# Patient Record
Sex: Female | Born: 1960
Health system: Southern US, Community
[De-identification: ages and names within clinical notes are randomized; demographics above are authoritative.]

## PROBLEM LIST (undated history)

## (undated) DIAGNOSIS — K219 Gastro-esophageal reflux disease without esophagitis: Secondary | ICD-10-CM

## (undated) DIAGNOSIS — M810 Age-related osteoporosis without current pathological fracture: Secondary | ICD-10-CM

## (undated) DIAGNOSIS — Z853 Personal history of malignant neoplasm of breast: Secondary | ICD-10-CM

## (undated) DIAGNOSIS — B009 Herpesviral infection, unspecified: Secondary | ICD-10-CM

## (undated) DIAGNOSIS — C50919 Malignant neoplasm of unspecified site of unspecified female breast: Secondary | ICD-10-CM

## (undated) DIAGNOSIS — Z801 Family history of malignant neoplasm of trachea, bronchus and lung: Secondary | ICD-10-CM

## (undated) DIAGNOSIS — Z9889 Other specified postprocedural states: Secondary | ICD-10-CM

## (undated) DIAGNOSIS — D649 Anemia, unspecified: Secondary | ICD-10-CM

## (undated) DIAGNOSIS — Z8041 Family history of malignant neoplasm of ovary: Secondary | ICD-10-CM

## (undated) HISTORY — DX: Family history of malignant neoplasm of ovary: Z80.41

## (undated) HISTORY — DX: Family history of malignant neoplasm of trachea, bronchus and lung: Z80.1

## (undated) HISTORY — DX: Other specified postprocedural states: Z98.890

## (undated) HISTORY — PX: BREAST ENHANCEMENT SURGERY: SHX7

## (undated) HISTORY — DX: Age-related osteoporosis without current pathological fracture: M81.0

## (undated) HISTORY — DX: Malignant neoplasm of unspecified site of unspecified female breast: C50.919

## (undated) HISTORY — DX: Personal history of malignant neoplasm of breast: Z85.3

## (undated) HISTORY — PX: BREAST LUMPECTOMY: SHX2

## (undated) HISTORY — DX: Anemia, unspecified: D64.9

---
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
## 1995-04-30 DIAGNOSIS — Z853 Personal history of malignant neoplasm of breast: Secondary | ICD-10-CM

## 1995-04-30 HISTORY — DX: Personal history of malignant neoplasm of breast: Z85.3

## 2001-07-13 ENCOUNTER — Encounter (INDEPENDENT_AMBULATORY_CARE_PROVIDER_SITE_OTHER): Payer: Self-pay | Admitting: Specialist

## 2001-07-13 ENCOUNTER — Ambulatory Visit (HOSPITAL_BASED_OUTPATIENT_CLINIC_OR_DEPARTMENT_OTHER): Admission: RE | Admit: 2001-07-13 | Discharge: 2001-07-14 | Payer: Self-pay | Admitting: Specialist

## 2002-10-12 ENCOUNTER — Encounter: Payer: Self-pay | Admitting: *Deleted

## 2002-10-12 ENCOUNTER — Encounter: Admission: RE | Admit: 2002-10-12 | Discharge: 2002-10-12 | Payer: Self-pay | Admitting: *Deleted

## 2003-03-28 ENCOUNTER — Other Ambulatory Visit: Admission: RE | Admit: 2003-03-28 | Discharge: 2003-03-28 | Payer: Self-pay | Admitting: Obstetrics and Gynecology

## 2003-04-11 ENCOUNTER — Ambulatory Visit (HOSPITAL_COMMUNITY): Admission: RE | Admit: 2003-04-11 | Discharge: 2003-04-11 | Payer: Self-pay | Admitting: Obstetrics and Gynecology

## 2004-03-06 ENCOUNTER — Ambulatory Visit (HOSPITAL_COMMUNITY): Admission: RE | Admit: 2004-03-06 | Discharge: 2004-03-06 | Payer: Self-pay | Admitting: Obstetrics and Gynecology

## 2004-08-07 ENCOUNTER — Other Ambulatory Visit: Admission: RE | Admit: 2004-08-07 | Discharge: 2004-08-07 | Payer: Self-pay | Admitting: Obstetrics and Gynecology

## 2006-02-27 DIAGNOSIS — B009 Herpesviral infection, unspecified: Secondary | ICD-10-CM

## 2006-02-27 HISTORY — DX: Herpesviral infection, unspecified: B00.9

## 2006-03-11 ENCOUNTER — Other Ambulatory Visit: Admission: RE | Admit: 2006-03-11 | Discharge: 2006-03-11 | Payer: Self-pay | Admitting: Obstetrics and Gynecology

## 2006-03-25 ENCOUNTER — Ambulatory Visit (HOSPITAL_COMMUNITY): Admission: RE | Admit: 2006-03-25 | Discharge: 2006-03-25 | Payer: Self-pay | Admitting: Obstetrics and Gynecology

## 2006-04-07 ENCOUNTER — Encounter: Admission: RE | Admit: 2006-04-07 | Discharge: 2006-04-07 | Payer: Self-pay | Admitting: Obstetrics and Gynecology

## 2006-04-13 ENCOUNTER — Encounter: Admission: RE | Admit: 2006-04-13 | Discharge: 2006-04-13 | Payer: Self-pay | Admitting: Obstetrics and Gynecology

## 2006-07-09 ENCOUNTER — Ambulatory Visit: Payer: Self-pay | Admitting: Oncology

## 2006-10-02 ENCOUNTER — Encounter: Admission: RE | Admit: 2006-10-02 | Discharge: 2006-10-02 | Payer: Self-pay | Admitting: Obstetrics and Gynecology

## 2009-03-02 ENCOUNTER — Encounter: Admission: RE | Admit: 2009-03-02 | Discharge: 2009-03-02 | Payer: Self-pay | Admitting: Obstetrics and Gynecology

## 2010-05-19 ENCOUNTER — Encounter: Payer: Self-pay | Admitting: Obstetrics and Gynecology

## 2010-05-20 ENCOUNTER — Encounter: Payer: Self-pay | Admitting: Obstetrics and Gynecology

## 2010-09-14 NOTE — Op Note (Signed)
[facility]  Patient:    Alice [name], Alice [name] Visit Number: [id number] MRN: [id number]          Service Type: DSU Location: [facility] Attending Physician:  Gustavus Messing Dictated by:   Yaakov Guthrie. Shon Hough, M.D. Proc. Date: 07/13/01 Admit Date:  07/13/2001                             Operative Report  INDICATIONS FOR PROCEDURE: This 50 year old lady has severe macromastia with back and shoulder pain secondary to large pendulous breast, increased discomfort, rashes under both breast areas, and pitting of both shoulders. Has tried conservative treatment with no avail for support of her chest.  OPERATION/PROCEDURE: Bilateral breast reduction using inferior pedicle technique.  SURGEON: Yaakov Guthrie. Shon Hough, M.D.  ASSISTANT: Margaretha Sheffield, R.N.  DESCRIPTION OF PROCEDURE: The patient was sat up and drawn for the inferior pedicle reduction and mammoplasty, remarking nipple and areolar complexes from 34 cm to 20 cm.  She then underwent general anesthesia and was intubated orally.  Prep was done to the chest and breast areas in routine fashion using Betadine soap and solution and walled off with sterile towels and drapes so as to make a sterile field.  Xylocaine 0.25% was then injected locally, a total of 200 cc per side.  The wounds were scored with 15 blades and then the skin over the inferior pedicle de-epithelialized with #20 blades.  The medial and lateral fatty dermal pedicles were incised down to underlying pectoralis fascia.  A new keyhole area was also debulked and out laterally more tissue was removed, and accessory breast tissue along the latissimus dorsi and anterior axillary regions.  After proper hemostasis and irrigation the flaps were transposed and stayed with 3-0 Prolene.  Subcutaneous closure was done with 3-0 Monocryl x2 layers, then a running subcuticular stitch of 3-0 Monocryl, and 5-0 Monocryl throughout the inverted T.   Steri-Strips and soft dressings were applied to all the areas.  She withstood the procedures very well and was taken to recovery in good condition.  ESTIMATED BLOOD LOSS: Less than 100 cc.  COMPLICATIONS: None. Dictated by:   Yaakov Guthrie. Shon Hough, M.D. Attending Physician:  Gustavus Messing DD:  07/13/01 TD:  07/14/01 Job: [id number]

## 2011-09-16 ENCOUNTER — Telehealth: Payer: Self-pay | Admitting: Obstetrics and Gynecology

## 2011-09-16 NOTE — Telephone Encounter (Signed)
Triage/epic 

## 2011-09-17 NOTE — Telephone Encounter (Signed)
EP THIS PT  HAS CONCERNS ABOUT HER FIBROIDS AND WANT TO TALK TO YOU ABOUT GETTING SOMETHING DONE.

## 2011-09-18 ENCOUNTER — Telehealth: Payer: Self-pay | Admitting: Obstetrics and Gynecology

## 2011-09-18 NOTE — Telephone Encounter (Signed)
51 YO with history of fibroids called with complaints of bleeding 2 weeks at a time and heavy. Has seen a GYN in [place] where she now resides but wanted to possibly have robotic surgery (hysterctomy or myomectomy with our practice.  Reviewed other options for management:  Mirena IUD and endometrial ablation.since she is close to menopause. Patient to have her records sent to [facility].  She will be in [facility] August 16th for 2 weeks (her spouse still resides her).  To contact patient once her records are received.  Demarian Epps, PA-C

## 2011-11-28 ENCOUNTER — Telehealth: Payer: Self-pay | Admitting: Obstetrics and Gynecology

## 2011-11-28 NOTE — Telephone Encounter (Signed)
Alice [name] pt want you to give her a call asap.

## 2011-12-19 ENCOUNTER — Encounter: Payer: Self-pay | Admitting: Obstetrics and Gynecology

## 2015-08-18 LAB — HM MAMMOGRAPHY

## 2015-09-15 DIAGNOSIS — Z9889 Other specified postprocedural states: Secondary | ICD-10-CM

## 2015-09-15 HISTORY — DX: Other specified postprocedural states: Z98.890

## 2015-09-15 LAB — HM SIGMOIDOSCOPY

## 2015-11-16 ENCOUNTER — Ambulatory Visit (INDEPENDENT_AMBULATORY_CARE_PROVIDER_SITE_OTHER): Payer: BLUE CROSS/BLUE SHIELD | Admitting: Medical

## 2015-11-16 ENCOUNTER — Encounter: Payer: Self-pay | Admitting: Medical

## 2015-11-16 VITALS — BP 110/70 | HR 88 | Temp 97.6°F

## 2015-11-16 DIAGNOSIS — R600 Localized edema: Secondary | ICD-10-CM

## 2015-11-16 NOTE — Progress Notes (Signed)
   Subjective:    Patient ID: Alice [name], female    DOB: [birth year], 55 y.o.   MRN: [id number]  HPI Chief Complaint  Patient presents with  . Leg Pain    both legs feeling swollen to her and has "dips" in it. does not weigh her self.    Here as a new patient today.   Moved back to [place] this past fall.   Was living out in [place].    Here for leg symptoms the last few days.   Feels swelling in legs, feels funny, little divets present.   No prior similar.  No recent strenuous activity.  Exercise - walks daily for about 45 minutes.  Working for [employer] as Scientist, forensic.    No recent fever, no recent illness.  No recent weight loss.  Was heavier in remote past, had lost a lot of weight.  Lets ache a little.   No recent long travel, injury, trauma, no hx/o DVT, no prior hx/o varicose veins, edema.  Up to date on pap smear.  Other than hx/o breast cancer, no other hx/o cancer.   No other aggravating or relieving factors. No other complaint.  Past Medical History  Diagnosis Date  . History of breast cancer    Past Surgical History  Procedure Laterality Date  . Breast lumpectomy      right due to breast cancer  . Breast enhancement surgery      s/p cancer and surgery, right   Social History   Social History  . Marital Status: Married    Spouse Name: N/A  . Number of Children: N/A  . Years of Education: N/A   Occupational History  . Not on file.   Social History Main Topics  . Smoking status: Never Smoker   . Smokeless tobacco: Not on file  . Alcohol Use: No  . Drug Use: No  . Sexual Activity: Not on file   Other Topics Concern  . Not on file   Social History Narrative  . No narrative on file     Review of Systems     Objective:   Physical Exam  BP 110/70 mmHg  Pulse 88  Wt   General appearance: alert, no distress, WD/WN, AA female Neck: supple, no lymphadenopathy, no thyromegaly, no masses, no JVD Heart: RRR, normal S1, S2,  no murmurs Lungs: CTA bilaterally, no wheezes, rhonchi, or rales Pulses: 2+ symmetric, upper and lower extremities, normal cap refill Legs throughout with faint divets, some fullness of soft tissue superificial of legs throughout, but no palpable cords, no warmth, nontender, no obvious inflamed varicose veins, no obvious varicose veins.  No worrisome findings.   Legs neurovascularly intact otherwise.      Assessment & Plan:   Encounter Diagnosis  Name Primary?  . Bilateral leg edema Yes   discussed symptoms, concerns.   Dr. Redmond School supervising physician also examined her.   We both were in agreement that although she note recent change in leg appearance, we suspect her legs have probably been looking this way a while.  Suggestive of veins congestion superficially, no evidence of DVT or other worrisome findings.   Advised leg elevation, limit salt, c/t routine exercise, can consider compression hose, short term Asprin OTC for a week.   F/u if worse.  Can consider vascular consult if worse.   Will request recent labs and records from gyn and GI.

## 2015-11-21 ENCOUNTER — Encounter: Payer: Self-pay | Admitting: Family Medicine

## 2015-11-21 ENCOUNTER — Telehealth: Payer: Self-pay

## 2015-11-21 NOTE — Telephone Encounter (Signed)
Records placed in your folder for review.

## 2015-11-29 ENCOUNTER — Encounter: Payer: Self-pay | Admitting: Family Medicine

## 2015-11-30 ENCOUNTER — Telehealth: Payer: Self-pay

## 2015-11-30 NOTE — Telephone Encounter (Signed)
Placed in your folder for review. 

## 2015-12-12 ENCOUNTER — Encounter: Payer: Self-pay | Admitting: Family Medicine

## 2015-12-19 ENCOUNTER — Encounter: Payer: Self-pay | Admitting: Family Medicine

## 2016-03-28 DIAGNOSIS — R05 Cough: Secondary | ICD-10-CM | POA: Diagnosis not present

## 2016-03-28 DIAGNOSIS — J069 Acute upper respiratory infection, unspecified: Secondary | ICD-10-CM | POA: Diagnosis not present

## 2016-04-01 DIAGNOSIS — J329 Chronic sinusitis, unspecified: Secondary | ICD-10-CM | POA: Diagnosis not present

## 2016-04-01 DIAGNOSIS — J069 Acute upper respiratory infection, unspecified: Secondary | ICD-10-CM | POA: Diagnosis not present

## 2016-07-22 DIAGNOSIS — Z1231 Encounter for screening mammogram for malignant neoplasm of breast: Secondary | ICD-10-CM | POA: Diagnosis not present

## 2016-07-22 DIAGNOSIS — Z01419 Encounter for gynecological examination (general) (routine) without abnormal findings: Secondary | ICD-10-CM | POA: Diagnosis not present

## 2016-07-22 DIAGNOSIS — Z124 Encounter for screening for malignant neoplasm of cervix: Secondary | ICD-10-CM | POA: Diagnosis not present

## 2016-07-22 DIAGNOSIS — N898 Other specified noninflammatory disorders of vagina: Secondary | ICD-10-CM | POA: Diagnosis not present

## 2017-02-12 DIAGNOSIS — M79672 Pain in left foot: Secondary | ICD-10-CM | POA: Diagnosis not present

## 2017-02-12 DIAGNOSIS — M79671 Pain in right foot: Secondary | ICD-10-CM | POA: Diagnosis not present

## 2017-03-10 DIAGNOSIS — M79604 Pain in right leg: Secondary | ICD-10-CM | POA: Diagnosis not present

## 2017-03-10 DIAGNOSIS — M79605 Pain in left leg: Secondary | ICD-10-CM | POA: Diagnosis not present

## 2017-03-12 DIAGNOSIS — R399 Unspecified symptoms and signs involving the genitourinary system: Secondary | ICD-10-CM | POA: Diagnosis not present

## 2017-03-17 DIAGNOSIS — M545 Low back pain: Secondary | ICD-10-CM | POA: Diagnosis not present

## 2017-04-15 DIAGNOSIS — M545 Low back pain: Secondary | ICD-10-CM | POA: Diagnosis not present

## 2017-04-18 DIAGNOSIS — M48062 Spinal stenosis, lumbar region with neurogenic claudication: Secondary | ICD-10-CM | POA: Diagnosis not present

## 2017-04-30 DIAGNOSIS — M5416 Radiculopathy, lumbar region: Secondary | ICD-10-CM | POA: Diagnosis not present

## 2017-04-30 DIAGNOSIS — M79662 Pain in left lower leg: Secondary | ICD-10-CM | POA: Diagnosis not present

## 2017-04-30 DIAGNOSIS — M79661 Pain in right lower leg: Secondary | ICD-10-CM | POA: Diagnosis not present

## 2017-05-12 DIAGNOSIS — Z131 Encounter for screening for diabetes mellitus: Secondary | ICD-10-CM | POA: Diagnosis not present

## 2017-05-12 DIAGNOSIS — Z0000 Encounter for general adult medical examination without abnormal findings: Secondary | ICD-10-CM | POA: Diagnosis not present

## 2017-05-12 DIAGNOSIS — Z862 Personal history of diseases of the blood and blood-forming organs and certain disorders involving the immune mechanism: Secondary | ICD-10-CM | POA: Diagnosis not present

## 2017-05-12 DIAGNOSIS — M4726 Other spondylosis with radiculopathy, lumbar region: Secondary | ICD-10-CM | POA: Diagnosis not present

## 2017-05-12 DIAGNOSIS — Z13220 Encounter for screening for lipoid disorders: Secondary | ICD-10-CM | POA: Diagnosis not present

## 2017-05-14 DIAGNOSIS — M79662 Pain in left lower leg: Secondary | ICD-10-CM | POA: Diagnosis not present

## 2017-05-14 DIAGNOSIS — M5416 Radiculopathy, lumbar region: Secondary | ICD-10-CM | POA: Diagnosis not present

## 2017-05-14 DIAGNOSIS — M79661 Pain in right lower leg: Secondary | ICD-10-CM | POA: Diagnosis not present

## 2017-05-30 DIAGNOSIS — M48061 Spinal stenosis, lumbar region without neurogenic claudication: Secondary | ICD-10-CM | POA: Diagnosis not present

## 2017-06-12 DIAGNOSIS — M79662 Pain in left lower leg: Secondary | ICD-10-CM | POA: Diagnosis not present

## 2017-06-12 DIAGNOSIS — M5416 Radiculopathy, lumbar region: Secondary | ICD-10-CM | POA: Diagnosis not present

## 2017-06-12 DIAGNOSIS — M79661 Pain in right lower leg: Secondary | ICD-10-CM | POA: Diagnosis not present

## 2017-07-29 DIAGNOSIS — M545 Low back pain: Secondary | ICD-10-CM | POA: Diagnosis not present

## 2017-07-31 DIAGNOSIS — L910 Hypertrophic scar: Secondary | ICD-10-CM | POA: Diagnosis not present

## 2017-08-29 DIAGNOSIS — M25562 Pain in left knee: Secondary | ICD-10-CM | POA: Diagnosis not present

## 2017-09-01 DIAGNOSIS — M25562 Pain in left knee: Secondary | ICD-10-CM | POA: Diagnosis not present

## 2017-09-15 DIAGNOSIS — M25562 Pain in left knee: Secondary | ICD-10-CM | POA: Diagnosis not present

## 2017-12-03 DIAGNOSIS — Z01419 Encounter for gynecological examination (general) (routine) without abnormal findings: Secondary | ICD-10-CM | POA: Diagnosis not present

## 2017-12-03 DIAGNOSIS — L910 Hypertrophic scar: Secondary | ICD-10-CM | POA: Diagnosis not present

## 2017-12-03 DIAGNOSIS — Z124 Encounter for screening for malignant neoplasm of cervix: Secondary | ICD-10-CM | POA: Diagnosis not present

## 2017-12-03 DIAGNOSIS — Z1231 Encounter for screening mammogram for malignant neoplasm of breast: Secondary | ICD-10-CM | POA: Diagnosis not present

## 2018-04-17 DIAGNOSIS — M25562 Pain in left knee: Secondary | ICD-10-CM | POA: Diagnosis not present

## 2018-05-20 DIAGNOSIS — M25562 Pain in left knee: Secondary | ICD-10-CM | POA: Diagnosis not present

## 2018-06-25 DIAGNOSIS — E669 Obesity, unspecified: Secondary | ICD-10-CM | POA: Diagnosis not present

## 2018-06-25 DIAGNOSIS — Z1211 Encounter for screening for malignant neoplasm of colon: Secondary | ICD-10-CM | POA: Diagnosis not present

## 2018-06-25 DIAGNOSIS — K219 Gastro-esophageal reflux disease without esophagitis: Secondary | ICD-10-CM | POA: Diagnosis not present

## 2018-06-25 DIAGNOSIS — R1013 Epigastric pain: Secondary | ICD-10-CM | POA: Diagnosis not present

## 2018-06-30 DIAGNOSIS — K219 Gastro-esophageal reflux disease without esophagitis: Secondary | ICD-10-CM

## 2018-06-30 HISTORY — DX: Gastro-esophageal reflux disease without esophagitis: K21.9

## 2018-07-09 ENCOUNTER — Other Ambulatory Visit: Payer: Self-pay | Admitting: Gastroenterology

## 2018-07-09 DIAGNOSIS — R1013 Epigastric pain: Secondary | ICD-10-CM

## 2018-07-13 ENCOUNTER — Ambulatory Visit
Admission: RE | Admit: 2018-07-13 | Discharge: 2018-07-13 | Disposition: A | Discharge status: Home / Self Care | Payer: BLUE CROSS/BLUE SHIELD | Source: Ambulatory Visit | Attending: Gastroenterology | Admitting: Gastroenterology

## 2018-07-13 DIAGNOSIS — R1013 Epigastric pain: Secondary | ICD-10-CM

## 2018-07-23 DIAGNOSIS — R51 Headache: Secondary | ICD-10-CM | POA: Diagnosis not present

## 2018-07-28 ENCOUNTER — Ambulatory Visit: Payer: BLUE CROSS/BLUE SHIELD | Admitting: Registered"

## 2018-08-12 ENCOUNTER — Other Ambulatory Visit: Payer: Self-pay | Admitting: Family Medicine

## 2018-08-12 DIAGNOSIS — R51 Headache: Principal | ICD-10-CM

## 2018-08-12 DIAGNOSIS — R519 Headache, unspecified: Secondary | ICD-10-CM

## 2018-08-17 DIAGNOSIS — N6310 Unspecified lump in the right breast, unspecified quadrant: Secondary | ICD-10-CM | POA: Diagnosis not present

## 2018-08-17 DIAGNOSIS — Z853 Personal history of malignant neoplasm of breast: Secondary | ICD-10-CM | POA: Diagnosis not present

## 2018-08-19 ENCOUNTER — Other Ambulatory Visit: Payer: Self-pay

## 2018-08-19 ENCOUNTER — Ambulatory Visit
Admission: RE | Admit: 2018-08-19 | Discharge: 2018-08-19 | Disposition: A | Discharge status: Home / Self Care | Payer: BLUE CROSS/BLUE SHIELD | Source: Ambulatory Visit | Attending: Family Medicine | Admitting: Family Medicine

## 2018-08-19 DIAGNOSIS — R519 Headache, unspecified: Secondary | ICD-10-CM

## 2018-08-19 DIAGNOSIS — R51 Headache: Secondary | ICD-10-CM | POA: Diagnosis not present

## 2018-09-08 DIAGNOSIS — Z853 Personal history of malignant neoplasm of breast: Secondary | ICD-10-CM | POA: Diagnosis not present

## 2018-09-23 ENCOUNTER — Other Ambulatory Visit: Payer: Self-pay | Admitting: Radiology

## 2018-09-23 DIAGNOSIS — C50811 Malignant neoplasm of overlapping sites of right female breast: Secondary | ICD-10-CM | POA: Diagnosis not present

## 2018-09-23 DIAGNOSIS — Z853 Personal history of malignant neoplasm of breast: Secondary | ICD-10-CM | POA: Diagnosis not present

## 2018-09-23 DIAGNOSIS — N6325 Unspecified lump in the left breast, overlapping quadrants: Secondary | ICD-10-CM | POA: Diagnosis not present

## 2018-09-25 ENCOUNTER — Telehealth: Payer: Self-pay | Admitting: Oncology

## 2018-09-25 ENCOUNTER — Encounter: Payer: Self-pay | Admitting: *Deleted

## 2018-09-25 NOTE — Telephone Encounter (Signed)
Spoke to patient to confirm morning [facility] appointment for 6/3, packet will be mailed to patient

## 2018-09-29 ENCOUNTER — Other Ambulatory Visit: Payer: Self-pay | Admitting: *Deleted

## 2018-09-29 ENCOUNTER — Encounter: Payer: Self-pay | Admitting: Oncology

## 2018-09-29 DIAGNOSIS — Z170 Estrogen receptor positive status [ER+]: Secondary | ICD-10-CM

## 2018-09-29 DIAGNOSIS — C50411 Malignant neoplasm of upper-outer quadrant of right female breast: Secondary | ICD-10-CM | POA: Insufficient documentation

## 2018-09-29 NOTE — Progress Notes (Signed)
[place]  [phone] [phone]     ID: Alice [name] DOB: 12-21-1960  MR#: [id number]  VXY#:[id number]  Patient Care Team: Alice Rm, NP-C as PCP - General (Family Medicine) Alice Kaufmann, RN as Oncology Nurse Navigator Alice Germany, RN as Oncology Nurse Navigator Alice [name], Alice Dad, MD as Consulting Physician (Oncology) Kyung Rudd, MD as Consulting Physician (Radiation Oncology) Rolm Bookbinder, MD as Consulting Physician (General Surgery) Alice Regal, PA-C as Consulting Physician (Obstetrics and Gynecology) Alice Craver, MD as Consulting Physician (Gastroenterology) Alice Amel, MD as Consulting Physician (Family Medicine) Alice Polio, MD as Consulting Physician (Plastic Surgery) Alice Cruel, MD OTHER MD:  CHIEF COMPLAINT: Estrogen receptor positive breast cancer  CURRENT TREATMENT: Awaiting definitive surgery   HISTORY OF CURRENT ILLNESS: Alice [name] has a history of remote right breast cancer in 1997, treated with right lumpectomy and radiation in [facility].  The patient did not receive chemotherapy or antiestrogen treatments.  More recently she presented for her annual mammogram with a non-tender area of firmness in the right breast at 9 o'clock for 2 months. She underwent bilateral diagnostic mammography with tomography and right breast ultrasonography at [facility] on 09/08/2018 showing: breast density category A; scattered-appearing fibroglandular tissue at the lumpectomy site in the upper outer right breast middle depth spanning 3.4 cm, with indistinct margins, was not seen on prior mammogram from 2018.  There was also a keloid scar in the inferior medial left breast.  On physical exam there was a rectangular area of hardness measuring up to 7 cm in the right breast at 9:00, corresponding with the prior lumpectomy and consistent with radiation change.  Ultrasound of this area showed  only postlumpectomy change, with no suspicious or discrete mass.  Ultrasound of the axilla was benign   Accordingly on 09/23/2018 she proceeded to biopsy of the right breast area in question. The pathology from this procedure [facility] 20-07/02/2000 showed: invasive ductal carcinoma, E-cadherin positive,  grade 3,  Prognostic indicators significant for: estrogen receptor at 100% positive and progesterone receptor, 30% positive, both with strong staining intensity proliferation marker Ki67 at 70%. HER2 negative by immunohistochemistry (0).  The patient's subsequent history is as detailed below.   INTERVAL HISTORY: Alice [name] was evaluated in the multidisciplinary breast cancer clinic on 09/30/2018. Her husband and brother participated via [place]. Her case was also presented at the multidisciplinary breast cancer conference on the same day. At that time a preliminary plan was proposed: Genetics testing, Oncotype from the initial biopsy, mastectomy, likely adjuvant radiation, antiestrogens   REVIEW OF SYSTEMS: Alice [name] reports doing well overall. She notes a lack of appetite, attributing it to the anxiety surrounding her diagnosis. She is working from home. She walks about 40 minutes every day. The patient denies unusual headaches, visual changes, nausea, vomiting, stiff neck, dizziness, or gait imbalance. There has been no cough, phlegm production, or pleurisy, no chest pain or pressure, and no change in bowel or bladder habits. The patient denies fever, rash, bleeding, unexplained fatigue or unexplained weight loss. A detailed review of systems was otherwise entirely negative.   PAST MEDICAL HISTORY: Past Medical History:  Diagnosis Date   Anemia    Breast cancer ([place])    GERD (gastroesophageal reflux disease) 06/30/2018   Herpes simplex type 2 infection 02/2006   History of breast cancer 1997   History of esophagogastroduodenoscopy (EGD) 09/15/2015   small hiatal hernia was noted otherwise all was  normal    PAST SURGICAL HISTORY: Past Surgical  History:  Procedure Laterality Date   BREAST ENHANCEMENT SURGERY     s/p cancer and surgery, right   BREAST LUMPECTOMY     right due to breast cancer  wisdom teeth removal   FAMILY HISTORY: Family History  Problem Relation Age of Onset   Ovarian cancer Sister 43   Dementia Mother    Aneurysm Father    Patient's father was 101 years old when he died from aneurysm. Patient's mother died from dementia at age 53. Her sister was diagnosed with ovarian cancer at age 13, and this subsequently took her life.  In total of the patient has 5 brothers and 2 sisters.  She is not aware of other cancers in the family  GYNECOLOGIC HISTORY:  No LMP recorded. Patient is postmenopausal. Menarche: 58 years old [place] P 0 LMP age 57 Contraceptive none HRT none  Hysterectomy? no BSO? 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 She is married. Husband Alice [name] is from [place], so his native language is [place]. She lives at home with her husband. They have no children. She attends a [employer] in [place].    ADVANCED DIRECTIVES: In the absence of documents to the contrary her husband is automatically her HCPOA.   HEALTH MAINTENANCE: Social History   Tobacco Use   Smoking status: Never Smoker  Substance Use Topics   Alcohol use: No    Alcohol/week: 0.0 standard drinks   Drug use: No     Colonoscopy: endoscopy in 2017, colonoscopy due 2020 but delayed due to virus  PAP: 12/03/2017, normal  Bone density: never done   No Known Allergies  No current outpatient medications on file.   No current facility-administered medications for this visit.     OBJECTIVE: [name] in no acute distress  Vitals:   09/30/18 0903  BP: 115/80  Pulse: 91  Resp: 18  Temp: 98.7 F (37.1 C)  SpO2: 100%     Body mass index is 33.8 kg/m.   Wt Readings  from Last 3 Encounters:  09/30/18 196 lb 14.4 oz (89.3 kg)      ECOG FS:1 - Symptomatic but completely ambulatory  Ocular: Sclerae unicteric, pupils round and equal Ear-nose-throat: Oropharynx clear and moist Lymphatic: No cervical or supraclavicular adenopathy Lungs no rales or rhonchi Heart regular rate and rhythm Abd soft, nontender, positive bowel sounds MSK no focal spinal tenderness, no joint edema Neuro: non-focal, well-oriented, appropriate affect Breasts: The right breast is status post remote lumpectomy and radiation.  There is some irregularity to the contour.  There is significant induration in the lower outer and lateral aspects.  The nipple is slightly inverted.  There is no erythema.  Both breasts are status post reduction mammoplasty.  Both axillae are benign   LAB RESULTS:  CMP     Component Value Date/Time   NA 139 09/30/2018 0819   K 3.7 09/30/2018 0819   CL 107 09/30/2018 0819   CO2 22 09/30/2018 0819   GLUCOSE 90 09/30/2018 0819   BUN 5 (L) 09/30/2018 0819   CREATININE 0.73 09/30/2018 0819   CALCIUM 9.0 09/30/2018 0819   PROT 7.9 09/30/2018 0819   ALBUMIN 4.0 09/30/2018 0819   AST 20 09/30/2018 0819   ALT 6 09/30/2018 0819   ALKPHOS 126 09/30/2018 0819   BILITOT 0.5 09/30/2018 0819   GFRNONAA >60 09/30/2018 0819   GFRAA >60 09/30/2018 0819    No results  found for: TOTALPROTELP, ALBUMINELP, A1GS, A2GS, BETS, BETA2SER, GAMS, MSPIKE, SPEI  No results found for: Nils Pyle, [facility]  Lab Results  Component Value Date   WBC 6.7 09/30/2018   NEUTROABS 4.2 09/30/2018   HGB 13.1 09/30/2018   HCT 39.6 09/30/2018   MCV 95.4 09/30/2018   PLT 353 09/30/2018    @LASTCHEMISTRY @  No results found for: LABCA2  No components found for: CBULAG536  No results for input(s): INR in the last 168 hours.  No results found for: LABCA2  No results found for: IWO032  No results found for: ZYY482  No results found for: NOI370  No results  found for: CA2729  No components found for: HGQUANT  No results found for: CEA1 / No results found for: CEA1   No results found for: AFPTUMOR  No results found for: CHROMOGRNA  No results found for: PSA1  Appointment on 09/30/2018  Component Date Value Ref Range Status   Sodium 09/30/2018 139  135 - 145 mmol/L Final   Potassium 09/30/2018 3.7  3.5 - 5.1 mmol/L Final   Chloride 09/30/2018 107  98 - 111 mmol/L Final   CO2 09/30/2018 22  22 - 32 mmol/L Final   Glucose, Bld 09/30/2018 90  70 - 99 mg/dL Final   BUN 09/30/2018 5* 6 - 20 mg/dL Final   Creatinine 09/30/2018 0.73  0.44 - 1.00 mg/dL Final   Calcium 09/30/2018 9.0  8.9 - 10.3 mg/dL Final   Total Protein 09/30/2018 7.9  6.5 - 8.1 g/dL Final   Albumin 09/30/2018 4.0  3.5 - 5.0 g/dL Final   AST 09/30/2018 20  15 - 41 U/L Final   ALT 09/30/2018 6  0 - 44 U/L Final   Alkaline Phosphatase 09/30/2018 126  38 - 126 U/L Final   Total Bilirubin 09/30/2018 0.5  0.3 - 1.2 mg/dL Final   GFR, Est Non Af Am 09/30/2018 >60  >60 mL/min Final   GFR, Est AFR Am 09/30/2018 >60  >60 mL/min Final   Anion gap 09/30/2018 10  5 - 15 Final   Performed at [facility], [place] [address], [place], [place] [address]   WBC Count 09/30/2018 6.7  4.0 - 10.5 K/uL Final   RBC 09/30/2018 4.15  3.87 - 5.11 MIL/uL Final   Hemoglobin 09/30/2018 13.1  12.0 - 15.0 g/dL Final   HCT 09/30/2018 39.6  36.0 - 46.0 % Final   MCV 09/30/2018 95.4  80.0 - 100.0 fL Final   MCH 09/30/2018 31.6  26.0 - 34.0 pg Final   MCHC 09/30/2018 33.1  30.0 - 36.0 g/dL Final   RDW 09/30/2018 12.4  11.5 - 15.5 % Final   Platelet Count 09/30/2018 353  150 - 400 K/uL Final   nRBC 09/30/2018 0.0  0.0 - 0.2 % Final   Neutrophils Relative % 09/30/2018 63  % Final   Neutro Abs 09/30/2018 4.2  1.7 - 7.7 K/uL Final   Lymphocytes Relative 09/30/2018 29  % Final   Lymphs Abs 09/30/2018 2.0  0.7 - 4.0 K/uL Final   Monocytes Relative  09/30/2018 7  % Final   Monocytes Absolute 09/30/2018 0.5  0.1 - 1.0 K/uL Final   Eosinophils Relative 09/30/2018 0  % Final   Eosinophils Absolute 09/30/2018 0.0  0.0 - 0.5 K/uL Final   Basophils Relative 09/30/2018 1  % Final   Basophils Absolute 09/30/2018 0.1  0.0 - 0.1 K/uL Final   Immature Granulocytes 09/30/2018 0  % Final  Abs Immature Granulocytes 09/30/2018 0.01  0.00 - 0.07 K/uL Final   Performed at [facility], [place] [address], [place], [place] [address]    (this displays the last labs from the last 3 days)  No results found for: TOTALPROTELP, ALBUMINELP, A1GS, A2GS, BETS, BETA2SER, GAMS, MSPIKE, SPEI (this displays SPEP labs)  No results found for: KPAFRELGTCHN, LAMBDASER, KAPLAMBRATIO (kappa/lambda light chains)  No results found for: HGBA, HGBA2QUANT, HGBFQUANT, HGBSQUAN (Hemoglobinopathy evaluation)   No results found for: LDH  No results found for: IRON, TIBC, IRONPCTSAT (Iron and TIBC)  No results found for: FERRITIN  Urinalysis No results found for: COLORURINE, APPEARANCEUR, LABSPEC, PHURINE, GLUCOSEU, HGBUR, BILIRUBINUR, KETONESUR, PROTEINUR, UROBILINOGEN, NITRITE, LEUKOCYTESUR   STUDIES: No results found.  ELIGIBLE FOR AVAILABLE RESEARCH PROTOCOL: no  ASSESSMENT: 58 y.o. Alice [name]   (1) status post right lumpectomy in 1997,  (a) status post adjuvant radiation  (b) did not receive antiestrogens or chemotherapy  (2) status post right breast upper outer quadrant biopsy 09/23/2018 for a clinical t2 N0, stage IIA invasive ductal carcinoma, grade 3, estrogen and progesterone receptor positive, HER-2 not amplified, with an MIB-1 of 70%  (3) Oncotype to be obtained from the initial biopsy  (4) definitive surgery to follow  (5) adjuvant chemotherapy likely  (6) anastrozole started 09/30/2018  PLAN: I spent approximately 60 minutes face to face with Stefan with more than 50% of that time spent in counseling and  coordination of care. Specifically we reviewed the biology of the patient's diagnosis and the specifics of her situation.  We first reviewed the fact that cancer is not one disease but more than 100 different diseases and that it is important to keep them separate-- otherwise when friends and relatives discuss their own cancer experiences with [place] confusion can result. Similarly we explained that if breast cancer spreads to the bone or liver, the patient would not have bone cancer or liver cancer, but breast cancer in the bone and breast cancer in the liver: one cancer in three places-- not 3 different cancers which otherwise would have to be treated in 3 different ways.  We discussed the difference between local and systemic therapy. In terms of loco-regional treatment, lumpectomy plus radiation is equivalent to mastectomy as far as survival is concerned.  However since as she already had radiation to the right breast, the standard of care is for a right mastectomy at this point.   We also noted that in terms of sequencing of treatments, whether systemic therapy or surgery is done first does not affect the ultimate outcome.  This is relevant to her case because she is interested in starting antiestrogens now given a couple of weeks wait before she can have her definitive surgery  We then discussed the rationale for systemic therapy. There is some risk that this cancer may have already spread to other parts of her body. Patients frequently ask at this point about bone scans, CAT scans and PET scans to find out if they have occult breast cancer somewhere else. The problem is that in early stage disease we are much more likely to find false positives then true cancers and this would expose the patient to unnecessary procedures as well as unnecessary radiation. Scans cannot answer the question the patient really would like to know, which is whether she has microscopic disease elsewhere in her body. For those  reasons we do not recommend them.  Of course we would proceed to aggressive evaluation of any symptoms  that might suggest metastatic disease, but that is not the case here.  Next we went over the options for systemic therapy which are anti-estrogens, anti-HER-2 immunotherapy, and chemotherapy. Elzena does not meet criteria for anti-HER-2 immunotherapy. She is a good candidate for anti-estrogens.  The question of chemotherapy is more complicated. Chemotherapy is most effective in rapidly growing, aggressive tumors.  Accordingly I expect her Oncotype to confirm her need for chemotherapy and she understands this.    The plan then is to start anastrozole now, so that if there is any surgical delays she is being treated; she will proceed to definitive surgery with or without reconstruction; very likely we will proceed to chemotherapy following that and if so that will be cyclophosphamide and docetaxel x4.  This will be followed by 5 years of anastrozole  She does qualify for genetics testing and that also was operational lysed today  Monseratt has a good understanding of the overall plan. She agrees with it. She knows the goal of treatment in her case is cure. She will call with any problems that may develop before her next visit here.   Alice Cruel, MD   09/30/2018 9:50 AM Medical Oncology and Hematology [facility] [address] [place],  [address] Tel. [phone]    Fax. [phone]   This document serves as a record of services personally performed by Lurline Del, MD. It was created on his behalf by Wilburn Mylar, a trained medical scribe. The creation of this record is based on the scribe's personal observations and the provider's statements to them.   I, Lurline Del MD, have reviewed the above documentation for accuracy and completeness, and I agree with the above.

## 2018-09-30 ENCOUNTER — Ambulatory Visit: Payer: BC Managed Care – PPO | Attending: General Surgery | Admitting: Physical Therapy

## 2018-09-30 ENCOUNTER — Encounter: Payer: Self-pay | Admitting: Licensed Clinical Social Worker

## 2018-09-30 ENCOUNTER — Encounter: Payer: Self-pay | Admitting: Oncology

## 2018-09-30 ENCOUNTER — Ambulatory Visit
Admission: RE | Admit: 2018-09-30 | Discharge: 2018-09-30 | Disposition: A | Discharge status: Home / Self Care | Payer: BLUE CROSS/BLUE SHIELD | Source: Ambulatory Visit | Attending: Radiation Oncology | Admitting: Radiation Oncology

## 2018-09-30 ENCOUNTER — Other Ambulatory Visit: Payer: Self-pay | Admitting: *Deleted

## 2018-09-30 ENCOUNTER — Encounter: Payer: Self-pay | Admitting: *Deleted

## 2018-09-30 ENCOUNTER — Inpatient Hospital Stay: Payer: BC Managed Care – PPO | Attending: Oncology | Admitting: Oncology

## 2018-09-30 ENCOUNTER — Inpatient Hospital Stay: Payer: BC Managed Care – PPO

## 2018-09-30 ENCOUNTER — Telehealth: Payer: Self-pay | Admitting: *Deleted

## 2018-09-30 ENCOUNTER — Other Ambulatory Visit: Payer: Self-pay

## 2018-09-30 ENCOUNTER — Ambulatory Visit: Payer: BC Managed Care – PPO | Admitting: Licensed Clinical Social Worker

## 2018-09-30 ENCOUNTER — Encounter: Payer: Self-pay | Admitting: Physical Therapy

## 2018-09-30 VITALS — BP 115/80 | HR 91 | Temp 98.7°F | Resp 18 | Ht 64.0 in | Wt 196.9 lb

## 2018-09-30 DIAGNOSIS — Z170 Estrogen receptor positive status [ER+]: Secondary | ICD-10-CM

## 2018-09-30 DIAGNOSIS — R293 Abnormal posture: Secondary | ICD-10-CM

## 2018-09-30 DIAGNOSIS — Z79811 Long term (current) use of aromatase inhibitors: Secondary | ICD-10-CM | POA: Diagnosis not present

## 2018-09-30 DIAGNOSIS — C50411 Malignant neoplasm of upper-outer quadrant of right female breast: Secondary | ICD-10-CM

## 2018-09-30 DIAGNOSIS — R1013 Epigastric pain: Secondary | ICD-10-CM | POA: Diagnosis not present

## 2018-09-30 DIAGNOSIS — C50911 Malignant neoplasm of unspecified site of right female breast: Secondary | ICD-10-CM | POA: Diagnosis not present

## 2018-09-30 DIAGNOSIS — Z8041 Family history of malignant neoplasm of ovary: Secondary | ICD-10-CM | POA: Insufficient documentation

## 2018-09-30 DIAGNOSIS — Z853 Personal history of malignant neoplasm of breast: Secondary | ICD-10-CM | POA: Diagnosis not present

## 2018-09-30 DIAGNOSIS — Z923 Personal history of irradiation: Secondary | ICD-10-CM | POA: Diagnosis not present

## 2018-09-30 DIAGNOSIS — Z801 Family history of malignant neoplasm of trachea, bronchus and lung: Secondary | ICD-10-CM | POA: Insufficient documentation

## 2018-09-30 DIAGNOSIS — L910 Hypertrophic scar: Secondary | ICD-10-CM | POA: Diagnosis not present

## 2018-09-30 LAB — CMP (CANCER CENTER ONLY)
ALT: 6 U/L (ref 0–44)
AST: 20 U/L (ref 15–41)
Albumin: 4 g/dL (ref 3.5–5.0)
Alkaline Phosphatase: 126 U/L (ref 38–126)
Anion gap: 10 (ref 5–15)
BUN: 5 mg/dL — ABNORMAL LOW (ref 6–20)
CO2: 22 mmol/L (ref 22–32)
Calcium: 9 mg/dL (ref 8.9–10.3)
Chloride: 107 mmol/L (ref 98–111)
Creatinine: 0.73 mg/dL (ref 0.44–1.00)
GFR, Est AFR Am: >60 mL/min (ref >60)
GFR, Estimated: >60 mL/min (ref >60)
Glucose, Bld: 90 mg/dL (ref 70–99)
Potassium: 3.7 mmol/L (ref 3.5–5.1)
Sodium: 139 mmol/L (ref 135–145)
Total Bilirubin: 0.5 mg/dL (ref 0.3–1.2)
Total Protein: 7.9 g/dL (ref 6.5–8.1)

## 2018-09-30 LAB — CBC WITH DIFFERENTIAL (CANCER CENTER ONLY)
Abs Immature Granulocytes: 0.01 10*3/uL (ref 0.00–0.07)
Basophils Absolute: 0.1 10*3/uL (ref 0.0–0.1)
Basophils Relative: 1 %
Eosinophils Absolute: 0 10*3/uL (ref 0.0–0.5)
Eosinophils Relative: 0 %
HCT: 39.6 % (ref 36.0–46.0)
Hemoglobin: 13.1 g/dL (ref 12.0–15.0)
Immature Granulocytes: 0 %
Lymphocytes Relative: 29 %
Lymphs Abs: 2 10*3/uL (ref 0.7–4.0)
MCH: 31.6 pg (ref 26.0–34.0)
MCHC: 33.1 g/dL (ref 30.0–36.0)
MCV: 95.4 fL (ref 80.0–100.0)
Monocytes Absolute: 0.5 10*3/uL (ref 0.1–1.0)
Monocytes Relative: 7 %
Neutro Abs: 4.2 10*3/uL (ref 1.7–7.7)
Neutrophils Relative %: 63 %
Platelet Count: 353 10*3/uL (ref 150–400)
RBC: 4.15 MIL/uL (ref 3.87–5.11)
RDW: 12.4 % (ref 11.5–15.5)
WBC Count: 6.7 10*3/uL (ref 4.0–10.5)
nRBC: 0 % (ref 0.0–0.2)

## 2018-09-30 MED ORDER — ANASTROZOLE 1 MG PO TABS: 1.0000 mg | ORAL_TABLET | Freq: Every day | ORAL | 2 refills | Status: DC

## 2018-09-30 NOTE — Therapy (Signed)
[place], [place], [address] Phone: [phone]   Fax:  [phone]  Physical Therapy Evaluation  Patient Details  Name: Alice [name] MRN: [id number] Date of Birth: July 18, 1960 Referring Provider (PT): Dr. Rolm Bookbinder   Encounter Date: 09/30/2018  PT End of Session - 09/30/18 1023    Visit Number  1    Number of Visits  2    Date for PT Re-Evaluation  11/25/18    PT Start Time  0947    PT Stop Time  1015    PT Time Calculation (min)  28 min    Activity Tolerance  Patient tolerated treatment well    Behavior During Therapy  [facility] for tasks assessed/performed       Past Medical History:  Diagnosis Date  . Anemia   . Breast cancer ([place])   . GERD (gastroesophageal reflux disease) 06/30/2018  . Herpes simplex type 2 infection 02/2006  . History of breast cancer 1997  . History of esophagogastroduodenoscopy (EGD) 09/15/2015   small hiatal hernia was noted otherwise all was normal    Past Surgical History:  Procedure Laterality Date  . BREAST ENHANCEMENT SURGERY     s/p cancer and surgery, right  . BREAST LUMPECTOMY     right due to breast cancer    There were no vitals filed for this visit.   Subjective Assessment - 09/30/18 1011    Subjective  Patient reports she is here today to be seen by her medical team for her newly diagnosed right breast cancer.    Patient is accompained by:  Family member   Present via facetime   Pertinent History  Patient was diagnosed on 09/08/2018 with right grade III invasive ductal carcinoma breast cancer. It measures 3.6 cm and is located in the upper outer quadrant. It is ER/PR positive and HER2 negative with a Ki67 of 70%. She has a history of a right breast cancer in 1997 when she had a lumpectomy and radiation. She is unsure of whether surgery involved lymph nodes.    Patient Stated Goals  Reduce lymphedema risk and learn post op shoulder ROM HEP     Currently in Pain?  No/denies         [facility] PT Assessment - 09/30/18 0001      Assessment   Medical Diagnosis  Right breast cancer    Referring Provider (PT)  Dr. Rolm Bookbinder    Onset Date/Surgical Date  09/08/18    Hand Dominance  Right    Prior Therapy  none      Precautions   Precautions  Other (comment)    Precaution Comments  active breast cancer; possibly at risk for right arm lymphedema      Restrictions   Weight Bearing Restrictions  No      Balance Screen   Has the patient fallen in the past 6 months  No    Has the patient had a decrease in activity level because of a fear of falling?   No    Is the patient reluctant to leave their home because of a fear of falling?   No      Home Environment   Living Environment  Private residence    Living Arrangements  Spouse/significant other    Available Help at Discharge  Family      Prior Function   Level of Independence  Independent    Vocation  Full time employment    Vocation Requirements  works for [employer]  She walks 3x/week for 30-40 min      Cognition   Overall Cognitive Status  Within Functional Limits for tasks assessed      Posture/Postural Control   Posture/Postural Control  Postural limitations    Postural Limitations  Rounded Shoulders;Forward head      ROM / Strength   AROM / PROM / Strength  AROM;Strength      AROM   AROM Assessment Site  Shoulder;Cervical    Right/Left Shoulder  Right;Left    Right Shoulder Extension  42 Degrees    Right Shoulder Flexion  137 Degrees    Right Shoulder ABduction  163 Degrees    Right Shoulder Internal Rotation  65 Degrees    Right Shoulder External Rotation  90 Degrees    Left Shoulder Extension  51 Degrees    Left Shoulder Flexion  129 Degrees    Left Shoulder ABduction  151 Degrees    Left Shoulder Internal Rotation  70 Degrees    Left Shoulder External Rotation  89 Degrees    Cervical Flexion  WNL    Cervical Extension  WNL     Cervical - Right Side Bend  WNL    Cervical - Left Side Bend  WNL    Cervical - Right Rotation  WNL    Cervical - Left Rotation  WNL      Strength   Overall Strength  Within functional limits for tasks performed        LYMPHEDEMA/ONCOLOGY QUESTIONNAIRE - 09/30/18 1017      Type   Cancer Type  Right breast cancer      Surgeries   Lumpectomy Date  04/30/95    Sentinel Lymph Node Biopsy Date  --   Possibly in 1997; pt unclear     Treatment   Active Chemotherapy Treatment  No    Past Chemotherapy Treatment  No    Active Radiation Treatment  No    Past Radiation Treatment  Yes    Date  --   sometime in 1997   Body Site  right breast    Current Hormone Treatment  No    Past Hormone Therapy  No      What other symptoms do you have   Are you Having Heaviness or Tightness  No    Are you having Pain  No    Are you having pitting edema  No    Is it Hard or Difficult finding clothes that fit  No    Do you have infections  No    Is there Decreased scar mobility  No    Stemmer Sign  No      Lymphedema Assessments   Lymphedema Assessments  Upper extremities      Right Upper Extremity Lymphedema   10 cm Proximal to Olecranon Process  36.8 cm    Olecranon Process  24.3 cm    10 cm Proximal to Ulnar Styloid Process  20.6 cm    Just Proximal to Ulnar Styloid Process  14.8 cm    Across Hand at [employer]  19.3 cm    At [place] of 2nd Digit  5.9 cm      Left Upper Extremity Lymphedema   10 cm Proximal to Olecranon Process  36.3 cm    Olecranon Process  24.8 cm    10 cm Proximal to Ulnar Styloid Process  20.4 cm    Just Proximal to  Ulnar Styloid Process  14.3 cm    Across Hand at [employer]  19.8 cm    At [place] of 2nd Digit  5.8 cm          Quick Dash - 09/30/18 0001    Open a tight or new jar  No difficulty    Do heavy household chores (wash walls, wash floors)  No difficulty    Carry a shopping bag or briefcase  No difficulty    Wash your back  No difficulty     Use a knife to cut food  No difficulty    Recreational activities in which you take some force or impact through your arm, shoulder, or hand (golf, hammering, tennis)  No difficulty    During the past week, to what extent has your arm, shoulder or hand problem interfered with your normal social activities with family, friends, neighbors, or groups?  Not at all    During the past week, to what extent has your arm, shoulder or hand problem limited your work or other regular daily activities  Not at all    Arm, shoulder, or hand pain.  None    Tingling (pins and needles) in your arm, shoulder, or hand  None    Difficulty Sleeping  No difficulty    DASH Score  0 %        Objective measurements completed on examination: See above findings.      Patient was instructed today in a home exercise program today for post op shoulder range of motion. These included active assist shoulder flexion in sitting, scapular retraction, wall walking with shoulder abduction, and hands behind head external rotation.  She was encouraged to do these twice a day, holding 3 seconds and repeating 5 times when permitted by her physician.       PT Education - 09/30/18 1021    Education Details  Lymphedema risk reduction and post op shoulder ROM HEP    Person(s) Educated  Patient    Methods  Explanation;Demonstration;Handout    Comprehension  Returned demonstration;Verbalized understanding          PT Long Term Goals - 09/30/18 1028      PT LONG TERM GOAL #1   Title  Patient will be able to demonstrate she has returned to baseline related to shoulder ROM and function.    Time  8    Period  Weeks    Status  New      Breast Clinic Goals - 09/30/18 1028      Patient will be able to verbalize understanding of pertinent lymphedema risk reduction practices relevant to her diagnosis specifically related to skin care.   Time  1    Period  Days    Status  Achieved      Patient will be able to return  demonstrate and/or verbalize understanding of the post-op home exercise program related to regaining shoulder range of motion.   Time  1    Period  Days    Status  Achieved      Patient will be able to verbalize understanding of the importance of attending the postoperative After Breast Cancer Class for further lymphedema risk reduction education and therapeutic exercise.   Time  1    Period  Days    Status  Achieved            Plan - 09/30/18 1023    Clinical Impression Statement  Patient was diagnosed on 09/08/2018  with right grade III invasive ductal carcinoma breast cancer. It measures 3.6 cm and is located in the upper outer quadrant. It is ER/PR positive and HER2 negative with a Ki67 of 70%. She has a history of a right breast cancer in 1997 when she had a lumpectomy and radiation. She is unsure of whether surgery involved lymph nodes. Patient's multidisciplinary medical team met prior to her assessments to determine a recommended treatment plan. She is planning to have a right mastectomy and sentinel node biopsy followed by Oncotype testing and anti-estrogen therapy. She will benefit from a post op PT visit to reassess and ensure she has returned to baseline.    Personal Factors and Comorbidities  Comorbidity 1    Comorbidities  Previous right breast cancer    Stability/Clinical Decision Making  Stable/Uncomplicated    Clinical Decision Making  Low    Rehab Potential  Excellent    PT Treatment/Interventions  ADLs/Self Care Home Management;Patient/family education;Therapeutic exercise    PT Next Visit Plan  Will reassess 3-4 weeks after surgery to determine needs    PT Home Exercise Plan  Post op shoulder ROM HEP    Consulted and Agree with Plan of Care  Patient;Family member/caregiver    Family Member Consulted  Husband on facetime       Patient will benefit from skilled therapeutic intervention in order to improve the following deficits and impairments:  Decreased range of  motion, Increased fascial restricitons, Impaired UE functional use, Pain, Postural dysfunction, Decreased knowledge of precautions  Visit Diagnosis: Malignant neoplasm of upper-outer quadrant of right breast in female, estrogen receptor positive ([facility]) - Plan: PT plan of care cert/re-cert  Abnormal posture - Plan: PT plan of care cert/re-cert   Patient will follow up at outpatient cancer rehab 3-4 weeks following surgery.  If the patient requires physical therapy at that time, a specific plan will be dictated and sent to the referring physician for approval. The patient was educated today on appropriate basic range of motion exercises to begin post operatively and the importance of attending the After Breast Cancer class following surgery.  Patient was educated today on lymphedema risk reduction practices as it pertains to recommendations that will benefit the patient immediately following surgery.  She verbalized good understanding.      Problem List Patient Active Problem List   Diagnosis Date Noted  . Malignant neoplasm of upper-outer quadrant of right breast in female, estrogen receptor positive ([place]) 09/29/2018   Annia Friendly, PT 09/30/18 10:31 AM  [place], [place], [address] Phone: [phone]   Fax:  [phone]  Name: Alice [name] MRN: [id number] Date of Birth: [birth year]

## 2018-09-30 NOTE — Progress Notes (Signed)
REFERRING PROVIDER: Chauncey Cruel, MD [place], [place] [address]  PRIMARY PROVIDER:  Girtha Rm, NP-C  PRIMARY REASON FOR VISIT:  1. Malignant neoplasm of upper-outer quadrant of right breast in female, estrogen receptor positive ([place])   2. Family history of ovarian cancer   3. Family history of lung cancer   4. Personal history of breast cancer    I connected with Alice [name] on 09/30/2018 at 11:00 AM EDT by Jackquline Denmark and verified that I am speaking with the correct person using two identifiers.    Patient location: clinic Provider location: home  HISTORY OF PRESENT ILLNESS:   Alice [name], a 58 y.o. female, was seen for a [facility] cancer genetics consultation at the request of Dr. Jana Hakim due to a personal and family history of cancer.  Alice [name] presents to clinic today to discuss the possibility of a hereditary predisposition to cancer, genetic testing, and to further clarify her future cancer risks, as well as potential cancer risks for family members.   In 1997 at the age of 60, Alice [name] was diagnosed with right breast cancer, this was treated with lumpectomy and radiation.  In 2020, at the age of 60, Alice [name] was diagnosed with invasive ductal carcinoma of the right breast, ER+, PR+, Her2-. The treatment plan includes anastrozole, surgery, and likely chemotherapy.  RISK FACTORS:  Menarche was at age 59.  First live birth at age no children.  OCP use for approximately 0 years. Ovaries intact: Yes Hysterectomy: No  Menopausal status: Post menopausal  HRT use: 0 years. Mammogram within the last year: Yes   Past Medical History:  Diagnosis Date  . Anemia   . Breast cancer ([place])   . Family history of lung cancer   . Family history of ovarian cancer   . GERD (gastroesophageal reflux disease) 06/30/2018  . Herpes simplex type 2 infection 02/2006  . History of breast cancer 1997  . History of  esophagogastroduodenoscopy (EGD) 09/15/2015   small hiatal hernia was noted otherwise all was normal    Past Surgical History:  Procedure Laterality Date  . BREAST ENHANCEMENT SURGERY     s/p cancer and surgery, right  . BREAST LUMPECTOMY     right due to breast cancer    Social History   Socioeconomic History  . Marital status: Married    Spouse name: Not on file  . Number of children: Not on file  . Years of education: Not on file  . Highest education level: Not on file  Occupational History  . Not on file  Social Needs  . Financial resource strain: Not on file  . Food insecurity:    Worry: Not on file    Inability: Not on file  . Transportation needs:    Medical: Not on file    Non-medical: Not on file  Tobacco Use  . Smoking status: Never Smoker  Substance and Sexual Activity  . Alcohol use: No    Alcohol/week: 0.0 standard drinks  . Drug use: No  . Sexual activity: Not on file  Lifestyle  . Physical activity:    Days per week: Not on file    Minutes per session: Not on file  . Stress: Not on file  Relationships  . Social connections:    Talks on phone: Not on file    Gets together: Not on file    Attends religious service: Not on file    Active member of club or organization:  Not on file    Attends meetings of clubs or organizations: Not on file    Relationship status: Not on file  Other Topics Concern  . Not on file  Social History Narrative  . Not on file     FAMILY HISTORY:  We obtained a detailed, 4-generation family history.  Significant diagnoses are listed below: Family History  Problem Relation Age of Onset  . Ovarian cancer Sister 32  . Dementia Mother   . Aneurysm Father    Alice [name] does not have children. She has 5 brothers, 2 sisters. One of her sisters had ovarian cancer at 27 and died at 21. No cancers in her nieces/nephews.  Alice [name] mother died at 65. The patient had 9 maternal uncles, 5 maternal aunts.  One uncle had lung cancer and a history of smoking. No other known cancers in aunts/uncles/maternal cousins. Both maternal grandparents passed in their 42s.  Alice [name] father died at 79. The patient had 1 paternal aunt, no known history of cancer. No cancer in paternal cousins. She has limited information about paternal grandparents, they died before she was born.   Alice [name] is unaware of previous family history of genetic testing for hereditary cancer risks. Patient's maternal ancestors are of African American descent, and paternal ancestors are of African American descent. There is no reported Ashkenazi Jewish ancestry. There is no known consanguinity.  GENETIC COUNSELING ASSESSMENT: Alice [name] is a 58 y.o. female with a personal and family history which is somewhat suggestive of a hereditary cancer syndrome and predisposition to cancer. We, therefore, discussed and recommended the following at today's visit.   DISCUSSION: We discussed that 5-10% of breast cancer is hereditary, with most cases associated with the BRCA1/BRCA2 genes.  There are other genes that can be associated with hereditary breast cancer syndromes.  We reviewed the characteristics, features and inheritance patterns of hereditary cancer syndromes. We also discussed genetic testing, including the appropriate family members to test, the process of testing, insurance coverage and turn-around-time for results. We discussed the implications of a negative, positive and/or variant of uncertain significant result. We recommended Alice [name] pursue genetic testing for the [facility] STAT Breast Cancer Panel + Common Hereditary Cancers gene panel.   The STAT Breast cancer panel offered by [facility] includes sequencing and rearrangement analysis for the following 9 genes:  ATM, BRCA1, BRCA2, CDH1, CHEK2, PALB2, PTEN, STK11 and TP53.    The Common Hereditary Cancers Panel offered by [facility] includes  sequencing and/or deletion duplication testing of the following 48 genes: APC, ATM, AXIN2, BARD1, BMPR1A, BRCA1, BRCA2, BRIP1, CDH1, CDKN2A (p14ARF), CDKN2A (p16INK4a), CKD4, CHEK2, CTNNA1, DICER1, EPCAM (Deletion/duplication testing only), GREM1 (promoter region deletion/duplication testing only), KIT, MEN1, MLH1, MSH2, MSH3, MSH6, MUTYH, NBN, NF1, NHTL1, PALB2, PDGFRA, PMS2, POLD1, POLE, PTEN, RAD50, RAD51C, RAD51D, RNF43, SDHB, SDHC, SDHD, SMAD4, SMARCA4. STK11, TP53, TSC1, TSC2, and VHL.  The following genes were evaluated for sequence changes only: SDHA and HOXB13 c.251G>A variant only.  Based on Alice [name] personal and family history of cancer, she meets medical criteria for genetic testing. Despite that she meets criteria, she may still have an out of pocket cost.   PLAN: After considering the risks, benefits, and limitations, Alice [name] provided informed consent to pursue genetic testing and the blood sample was sent to [facility] for analysis of the STAT Breast Cancer Panel + Common Hereditary Cancers Panel. Initial results should be available within approximately 1 weeks' time, at which point they will be disclosed  by telephone to Alice [name], as will any additional recommendations warranted by these results. Alice [name] will receive a summary of her genetic counseling visit and a copy of her results once available. This information will also be available in Epic.   Lastly, we encouraged Alice [name] to remain in contact with cancer genetics annually so that we can continuously update the family history and inform her of any changes in cancer genetics and testing that may be of benefit for this family.   Alice [name] questions were answered to her satisfaction today. Our contact information was provided should additional questions or concerns arise. Thank you for the referral and allowing [place] to share in the care of your patient.    Faith Rogue, MS Genetic Counselor [email or web address] Phone: [phone]  The patient was seen for a total of 15 minutes in virtual genetic counseling.  Drs. Magrinat, Lindi Adie and/or Burr Medico were available for discussion regarding this case.

## 2018-09-30 NOTE — Progress Notes (Signed)
Radiation Oncology         ([phone]) [phone] ________________________________  Name: Alice [name]        MRN: [id number]  Date of Service: 09/30/2018 DOB: 14-Feb-1961  CB:ULAGTX, Laurian Brim, NP-C  Rolm Bookbinder, MD     REFERRING PHYSICIAN: Rolm Bookbinder, MD   DIAGNOSIS: The encounter diagnosis was Malignant neoplasm of upper-outer quadrant of right breast in female, estrogen receptor positive ([place]).   HISTORY OF PRESENT ILLNESS: Alice [name] is a 58 y.o. female seen in the multidisciplinary breast clinic for a new diagnosis of right breast cancer.  The patient has a history of a right breast cancer treated in 1997 with lumpectomy and radiotherapy.  She did not have chemotherapy.  Over time she has noticed increasing thickening at her previous mastectomy scar site.  She has noticed this more specifically in the recent past, and underwent diagnostic imaging at [facility]  By mammography, she had what appeared to be a 2 x 1.8 cm density in the right breast adjacent to her previous lumpectomy site, by ultrasound it was measured as 2.6 x 3.6 cm though this was not as discrete.  Her axilla was negative for adenopathy.  She underwent a biopsy of the site revealing a grade 3 invasive ductal carcinoma which was ER/PR positive, and HER-2/neu was negative.  Her Ki-67 was 70%.   PREVIOUS RADIATION THERAPY: Yes   1997: The patient received whole breast radiation to the right breast details are otherwise unknown.   PAST MEDICAL HISTORY:  Past Medical History:  Diagnosis Date  . Anemia   . Breast cancer ([place])   . GERD (gastroesophageal reflux disease) 06/30/2018  . Herpes simplex type 2 infection 02/2006  . History of breast cancer 1997  . History of esophagogastroduodenoscopy (EGD) 09/15/2015   small hiatal hernia was noted otherwise all was normal       PAST SURGICAL HISTORY: Past Surgical History:  Procedure Laterality Date  . BREAST ENHANCEMENT SURGERY     s/p cancer  and surgery, right  . BREAST LUMPECTOMY     right due to breast cancer     FAMILY HISTORY:  Family History  Problem Relation Age of Onset  . Ovarian cancer Sister 72     SOCIAL HISTORY:  reports that she has never smoked. She does not have any smokeless tobacco history on file. She reports that she does not drink alcohol or use drugs.  The patient is married and resides in [place].  She works for [employer].  ALLERGIES: Patient has no known allergies.   MEDICATIONS:  No current outpatient medications on file.   No current facility-administered medications for this visit.      REVIEW OF SYSTEMS: On review of systems, the patient reports that she is doing well overall. She denies any chest pain, shortness of breath, cough, fevers, chills, night sweats, unintended weight changes. She denies any bowel or bladder disturbances, and denies abdominal pain, nausea or vomiting. She denies any new musculoskeletal or joint aches or pains. A complete review of systems is obtained and is otherwise negative.     PHYSICAL EXAM:  Wt Readings from Last 3 Encounters:  No data found for Wt   Temp Readings from Last 3 Encounters:  11/16/15 97.6 F (36.4 C) (Tympanic)   BP Readings from Last 3 Encounters:  11/16/15 110/70   Pulse Readings from Last 3 Encounters:  11/16/15 88     In general this is a well appearing female in no acute distress.  She's alert and oriented x4 and appropriate throughout the examination. Cardiopulmonary assessment is negative for acute distress and she exhibits normal effort.  Breast exam is deferred.   ECOG = 1  0 - Asymptomatic (Fully active, able to carry on all predisease activities without restriction)  1 - Symptomatic but completely ambulatory (Restricted in physically strenuous activity but ambulatory and able to carry out work of a light or sedentary nature. For example, light housework, office work)  2 - Symptomatic, <50% in bed during the day  (Ambulatory and capable of all self care but unable to carry out any work activities. Up and about more than 50% of waking hours)  3 - Symptomatic, >50% in bed, but not bedbound (Capable of only limited self-care, confined to bed or chair 50% or more of waking hours)  4 - Bedbound (Completely disabled. Cannot carry on any self-care. Totally confined to bed or chair)  5 - Death   Eustace Pen MM, Creech RH, Tormey DC, et al. [phone]). "Toxicity and response criteria of the [facility]". [facility] Oncol. 5 (6): 649-55    LABORATORY DATA:  No results found for: WBC, HGB, HCT, MCV, PLT No results found for: NA, K, CL, CO2 No results found for: ALT, AST, GGT, ALKPHOS, BILITOT    RADIOGRAPHY: No results found.     IMPRESSION/PLAN: 1. Stage IIA, cT2N1aM0 ER positive invasive ductal carcinoma of the right breast.  I discussed the pathology findings and reviewed the nature of right breast disease.  Her disease appears to be a second primary.  The consensus from the breast conference given her prior treatment includes proceeding with mastectomy.  Medical oncology is also planning to consider systemic chemotherapy.  She was counseled that there does not appear to be a role for radiotherapy.  We will be happy to review her results after her surgery if there is any additional consideration for this. 2. Remote history of right breast cancer s/p lumpectomy and radiotherapy.  As above her previous treatment plays a role for how she will be treated for her newly diagnosed cancer. 3. Possible genetic predisposition to malignancy. The patient is a candidate for genetic testing given her personal and family history.  In a visit lasting 15 minutes, greater than 50% of the time was spent face to face discussing her case, and coordinating her care.    ________________________________   Jodelle Gross, MD, PhD

## 2018-09-30 NOTE — Progress Notes (Signed)
Your patient 

## 2018-09-30 NOTE — Telephone Encounter (Signed)
Received order for oncotype testing on core bx. Requisition faxed to [facility] and [facility] 

## 2018-09-30 NOTE — Patient Instructions (Signed)

## 2018-10-01 ENCOUNTER — Other Ambulatory Visit (HOSPITAL_COMMUNITY): Payer: Self-pay | Admitting: General Surgery

## 2018-10-01 ENCOUNTER — Other Ambulatory Visit: Payer: Self-pay | Admitting: General Surgery

## 2018-10-01 ENCOUNTER — Telehealth: Payer: Self-pay | Admitting: Oncology

## 2018-10-01 DIAGNOSIS — Z170 Estrogen receptor positive status [ER+]: Secondary | ICD-10-CM

## 2018-10-01 DIAGNOSIS — C50411 Malignant neoplasm of upper-outer quadrant of right female breast: Secondary | ICD-10-CM

## 2018-10-01 NOTE — Telephone Encounter (Signed)
Talk with patient regarding schedule °

## 2018-10-02 DIAGNOSIS — R202 Paresthesia of skin: Secondary | ICD-10-CM | POA: Diagnosis not present

## 2018-10-02 DIAGNOSIS — R102 Pelvic and perineal pain: Secondary | ICD-10-CM | POA: Diagnosis not present

## 2018-10-05 ENCOUNTER — Encounter: Payer: Self-pay | Admitting: General Practice

## 2018-10-05 NOTE — Progress Notes (Signed)
[facility] Psychosocial Distress Screening [place] by phone following Breast Multidisciplinary Clinic to introduce [place] team/resources, reviewing distress screen per protocol.  The patient scored a [unspecified] on the Psychosocial Distress Thermometer which indicates [unspecified] distress. Also assessed for distress and other psychosocial needs.   ONCBCN DISTRESS SCREENING 10/05/2018  Screening Type Initial Screening  Emotional problem type Nervousness/Anxiety;Adjusting to illness  Physical Problem type Loss of appetitie  Referral to support programs Yes   Ms Alcaide was very appreciative of call, taking my direct dial number in case of future need/desire to connect. She reports good support from husband and family, uses prayer for meaning-making, and is regaining some sense of control by walking and maintaining a healthy diet. Per pt, she works for [employer] and is starting to use its tagline, "The will to win," as a personal motivational tool regarding surgery and treatment.   Follow up needed: No. Per pt, no other needs at this time, but she is aware of ongoing [place] team/programming availability. Please also page if needs arise or circumstances change. Thank you!   [place], [place], [facility] Pager [phone] Voicemail [phone]

## 2018-10-06 ENCOUNTER — Telehealth: Payer: Self-pay

## 2018-10-06 ENCOUNTER — Ambulatory Visit: Payer: Self-pay | Admitting: Licensed Clinical Social Worker

## 2018-10-06 ENCOUNTER — Encounter: Payer: Self-pay | Admitting: Licensed Clinical Social Worker

## 2018-10-06 ENCOUNTER — Telehealth: Payer: Self-pay | Admitting: Licensed Clinical Social Worker

## 2018-10-06 DIAGNOSIS — Z801 Family history of malignant neoplasm of trachea, bronchus and lung: Secondary | ICD-10-CM

## 2018-10-06 DIAGNOSIS — Z8041 Family history of malignant neoplasm of ovary: Secondary | ICD-10-CM

## 2018-10-06 DIAGNOSIS — C50411 Malignant neoplasm of upper-outer quadrant of right female breast: Secondary | ICD-10-CM

## 2018-10-06 DIAGNOSIS — Z1379 Encounter for other screening for genetic and chromosomal anomalies: Secondary | ICD-10-CM

## 2018-10-06 DIAGNOSIS — Z170 Estrogen receptor positive status [ER+]: Secondary | ICD-10-CM

## 2018-10-06 DIAGNOSIS — Z853 Personal history of malignant neoplasm of breast: Secondary | ICD-10-CM

## 2018-10-06 NOTE — Telephone Encounter (Signed)
Revealed negative genetic testing.  Revealed that a VUS in APC and VHL was identified. This normal result is reassuring and indicates that it is unlikely Alice [name] cancer is due to a hereditary cause.  It is unlikely that there is an increased risk of another cancer due to a mutation in one of these genes.  However, genetic testing is not perfect, and cannot definitively rule out a hereditary cause.  It will be important for her to keep in contact with genetics to learn if any additional testing may be needed in the future.

## 2018-10-06 NOTE — Progress Notes (Signed)
HPI:  Alice [name] was previously seen in the [place] clinic due to a personal and family history of cancer and concerns regarding a hereditary predisposition to cancer. Please refer to our prior cancer genetics clinic note for more information regarding our discussion, assessment and recommendations, at the time. Alice [name] recent genetic test results were disclosed to her, as were recommendations warranted by these results. These results and recommendations are discussed in more detail below.  CANCER HISTORY:    Malignant neoplasm of upper-outer quadrant of right breast in female, estrogen receptor positive ([place])   09/29/2018 Initial Diagnosis    Malignant neoplasm of upper-outer quadrant of right breast in female, estrogen receptor positive ([place])    10/06/2018 Genetic Testing    Negative genetic testing. 2 VUS's identified: VUS in APC c.-34014C>T and VHL c.340+272T>G identified on the [facility] Common Hereditary Cancers Panel. The Common Hereditary Cancers Panel offered by [facility] includes sequencing and/or deletion duplication testing of the following 47 genes: APC, ATM, AXIN2, BARD1, BMPR1A, BRCA1, BRCA2, BRIP1, CDH1, CDKN2A (p14ARF), CDKN2A (p16INK4a), CKD4, CHEK2, CTNNA1, DICER1, EPCAM (Deletion/duplication testing only), GREM1 (promoter region deletion/duplication testing only), KIT, MEN1, MLH1, MSH2, MSH3, MSH6, MUTYH, NBN, NF1, NHTL1, PALB2, PDGFRA, PMS2, POLD1, POLE, PTEN, RAD50, RAD51C, RAD51D, SDHB, SDHC, SDHD, SMAD4, SMARCA4. STK11, TP53, TSC1, TSC2, and VHL.  The following genes were evaluated for sequence changes only: SDHA and HOXB13 c.251G>A variant only. The report date is 10/06/2018.     FAMILY HISTORY:  We obtained a detailed, 4-generation family history.  Significant diagnoses are listed below: Family History  Problem Relation Age of Onset  . Ovarian cancer Sister 31  . Dementia Mother   . Aneurysm Father    [name] does not have  children. She has 5 brothers, 2 sisters. One of her sisters had ovarian cancer at 73 and died at 77. No cancers in her nieces/nephews.  Alice [name] mother died at 20. The patient had 9 maternal uncles, 5 maternal aunts. One uncle had lung cancer and a history of smoking. No other known cancers in aunts/uncles/maternal cousins. Both maternal grandparents passed in their 49s.  Ms. [name] father died at 71. The patient had 1 paternal aunt, no known history of cancer. No cancer in paternal cousins. She has limited information about paternal grandparents, they died before she was born.   Alice [name] is unaware of previous family history of genetic testing for hereditary cancer risks. There is no reported Ashkenazi Jewish ancestry. There is no known consanguinity.  GENETIC TEST RESULTS: Genetic testing reported out on 10/06/2018 through the [facility] Common Hereditary cancer panel found no pathogenic mutations.   The Common Hereditary Cancers Panel offered by [facility] includes sequencing and/or deletion duplication testing of the following 47 genes: APC, ATM, AXIN2, BARD1, BMPR1A, BRCA1, BRCA2, BRIP1, CDH1, CDKN2A (p14ARF), CDKN2A (p16INK4a), CKD4, CHEK2, CTNNA1, DICER1, EPCAM (Deletion/duplication testing only), GREM1 (promoter region deletion/duplication testing only), KIT, MEN1, MLH1, MSH2, MSH3, MSH6, MUTYH, NBN, NF1, NHTL1, PALB2, PDGFRA, PMS2, POLD1, POLE, PTEN, RAD50, RAD51C, RAD51D,  SDHB, SDHC, SDHD, SMAD4, SMARCA4. STK11, TP53, TSC1, TSC2, and VHL.  The following genes were evaluated for sequence changes only: SDHA and HOXB13 c.251G>A variant only.  The test report has been scanned into EPIC and is located under the Molecular Pathology section of the Results Review tab.  A portion of the result report is included below for reference.     We discussed with Alice [name] that because current genetic testing is not perfect, it is possible  there may be a gene  mutation in one of these genes that current testing cannot detect, but that chance is small.  We also discussed, that there could be another gene that has not yet been discovered, or that we have not yet tested, that is responsible for the cancer diagnoses in the family. It is also possible there is a hereditary cause for the cancer in the family that Alice [name] did not inherit and therefore was not identified in her testing.  Therefore, it is important to remain in touch with cancer genetics in the future so that we can continue to offer Alice [name] the most up to date genetic testing.   Genetic testing did identify 2 Variants of uncertain significance (VUS) - one in the APC gene called c.-30414C>T (non-coding), a second in the VHL gene called c.340+727T>G ([employer]). At this time, it is unknown if these variants are associated with increased cancer risk or if they are normal findings, but most variants such as these get reclassified to being inconsequential. They should not be used to make medical management decisions. With time, we suspect the lab will determine the significance of these variants, if any. If we do learn more about them, we will try to contact Alice [name] to discuss it further. However, it is important to stay in touch with [place] periodically and keep the address and phone number up to date.  ADDITIONAL GENETIC TESTING: We discussed with Alice [name] that her genetic testing was fairly extensive.  If there are genes identified to increase cancer risk that can be analyzed in the future, we would be happy to discuss and coordinate this testing at that time.    CANCER SCREENING RECOMMENDATIONS: Alice [name] test result is considered negative (normal).  This means that we have not identified a hereditary cause for her  personal and family history of cancer at this time. Most cancers happen by chance and this negative test suggests that her cancer may  fall into this category.    While reassuring, this does not definitively rule out a hereditary predisposition to cancer. It is still possible that there could be genetic mutations that are undetectable by current technology. There could be genetic mutations in genes that have not been tested or identified to increase cancer risk.  Therefore, it is recommended she continue to follow the cancer management and screening guidelines provided by her oncology and primary healthcare provider.   An individual's cancer risk and medical management are not determined by genetic test results alone. Overall cancer risk assessment incorporates additional factors, including personal medical history, family history, and any available genetic information that may result in a personalized plan for cancer prevention and surveillance  RECOMMENDATIONS FOR FAMILY MEMBERS:  Relatives in this family might be at some increased risk of developing cancer, over the general population risk, simply due to the family history of cancer.  We recommended female relatives in this family have a yearly mammogram beginning at age 39, or 71 years younger than the earliest onset of cancer, an annual clinical breast exam, and perform monthly breast self-exams. Female relatives in this family should also have a gynecological exam as recommended by their primary provider. All family members should have a colonoscopy by age 36, or as directed by their physicians.  It is also possible there is a hereditary cause for the cancer in Alice [name] family that she did not inherit and therefore was not identified in her.  Based on Alice [name] family history,  we recommended those related to her sister have genetic counseling and testing. Alice [name] will let us know if we can be of any assistance in coordinating genetic counseling and/or testing for these family members.  FOLLOW-UP: Lastly, we discussed with Alice [name]  that cancer genetics is a rapidly advancing field and it is possible that new genetic tests will be appropriate for her and/or her family members in the future. We encouraged her to remain in contact with cancer genetics on an annual basis so we can update her personal and family histories and let her know of advances in cancer genetics that may benefit this family.   Our contact number was provided. Alice [name] questions were answered to her satisfaction, and she knows she is welcome to call us at anytime with additional questions or concerns.   Faith Rogue, MS Genetic Counselor [email or web address] Phone: [phone]

## 2018-10-06 NOTE — Telephone Encounter (Signed)
Nutrition Assessment:  RD working remotely.  58 year old female with right breast cancer.  Planning right mastectomy on 6/25 and oncotype testing.  Past medical history of right breast cancer in 1997 with lumpectomy and radiation therapy, GERD, anemia.  Patient called RD and left message.  RD called patient back to address nutrition questions and concerns.  Patient reports that she is a vegetarian and concerned about what foods she needs to avoid with estrogen positive breast cancer.  Patient reports that she avoids most meats, fish, eggs and dairy products.     Medications: reviewed  Labs: reviewed  Anthropometrics:   Height: 64 inches Weight: 196 lb 14.4 oz BMI: 33   NUTRITION DIAGNOSIS: Food and nutrition related knowledge deficit related to new diagnosis of breast cancer as evidenced by questions regarding diet   INTERVENTION:  Reviewed current recommendations from [employer] for [place] regarding plant based diet.  Patient received nutrition packet during breast clinic.  Encouraged variety of plant foods, whole grains and good sources of protein.   Recommend checking vit b 12 level with patient reporting that she is vegan.  Questions answered.   Patient has contact information and will contact RD if has more questions.     MONITORING, EVALUATION, GOAL: Patient will consume plant based diet and maintain lean muscle mass during treatment   NEXT VISIT: patient to contact   Nada Godley B. Zenia Resides, [place], [place] Registered Dietitian [phone] (pager)

## 2018-10-07 ENCOUNTER — Ambulatory Visit: Payer: BLUE CROSS/BLUE SHIELD | Admitting: Registered"

## 2018-10-07 ENCOUNTER — Telehealth: Payer: Self-pay | Admitting: *Deleted

## 2018-10-07 DIAGNOSIS — Z923 Personal history of irradiation: Secondary | ICD-10-CM | POA: Diagnosis not present

## 2018-10-07 DIAGNOSIS — Z170 Estrogen receptor positive status [ER+]: Secondary | ICD-10-CM | POA: Diagnosis not present

## 2018-10-07 DIAGNOSIS — Z853 Personal history of malignant neoplasm of breast: Secondary | ICD-10-CM | POA: Diagnosis not present

## 2018-10-07 DIAGNOSIS — C50411 Malignant neoplasm of upper-outer quadrant of right female breast: Secondary | ICD-10-CM | POA: Diagnosis not present

## 2018-10-07 NOTE — Telephone Encounter (Signed)
Spoke to pt regarding [place] from 6.3.20. Denies questions or concerns regarding dx or treatment care plan. Informed pt that oncotype results will be back next week. Pt is contemplating bilateral mastectomy and would like to speak to Dr. Donne Hazel. Msg sent. Encourage pt to call with needs. Received verbal understanding.

## 2018-10-08 DIAGNOSIS — C50411 Malignant neoplasm of upper-outer quadrant of right female breast: Secondary | ICD-10-CM | POA: Diagnosis not present

## 2018-10-08 DIAGNOSIS — Z170 Estrogen receptor positive status [ER+]: Secondary | ICD-10-CM | POA: Diagnosis not present

## 2018-10-09 ENCOUNTER — Telehealth: Payer: Self-pay | Admitting: *Deleted

## 2018-10-09 NOTE — Telephone Encounter (Signed)
Received oncotype results of 26/16%.  Patient is aware and webex scheduled for 10/15/18 at 11:30am to discuss.

## 2018-10-12 ENCOUNTER — Encounter (HOSPITAL_COMMUNITY): Payer: Self-pay | Admitting: Oncology

## 2018-10-13 ENCOUNTER — Telehealth: Payer: Self-pay | Admitting: Oncology

## 2018-10-13 NOTE — Telephone Encounter (Signed)
Called patient regarding upcoming Webex appointment, patient is notified and e-mail has been sent. °

## 2018-10-14 ENCOUNTER — Telehealth: Payer: Self-pay | Admitting: Oncology

## 2018-10-14 ENCOUNTER — Encounter (HOSPITAL_BASED_OUTPATIENT_CLINIC_OR_DEPARTMENT_OTHER): Payer: Self-pay | Admitting: *Deleted

## 2018-10-14 ENCOUNTER — Other Ambulatory Visit: Payer: Self-pay

## 2018-10-14 NOTE — Progress Notes (Signed)
[place]  Telephone:(336) [phone] Fax:(336) [phone]     ID: Alice [name] DOB: Sep 30, 1960  MR#: [id number]  GEZ#:[id number]  Patient Care Team: Alice Rm, NP-C as PCP - General (Family Medicine) Alice Kaufmann, RN as Oncology Nurse Navigator Alice Germany, RN as Oncology Nurse Navigator Alice [name], Alice Dad, MD as Consulting Physician (Oncology) Alice Rudd, MD as Consulting Physician (Radiation Oncology) Alice Bookbinder, MD as Consulting Physician (General Surgery) Alice Regal, PA-C as Consulting Physician (Obstetrics and Gynecology) Alice Craver, MD as Consulting Physician (Gastroenterology) Alice Amel, MD as Consulting Physician (Family Medicine) Alice Polio, MD as Consulting Physician (Plastic Surgery) Alice Cruel, MD OTHER MD:  I connected with Alice [name] on 10/15/18 at 11:30 AM EDT by video enabled telemedicine visit and verified that I am speaking with the correct person using two identifiers.   I discussed the limitations, risks, security and privacy concerns of performing an evaluation and management service by telemedicine and the availability of in-person appointments. I also discussed with the patient that there may be a patient responsible charge related to this service. The patient expressed understanding and agreed to proceed.   Other persons participating in the visit and their role in the encounter: brother Alice [name]; husband Alice [name]; Alice [name], scribe   Patients location: home  Providers location: [place]: Estrogen receptor positive breast cancer  CURRENT TREATMENT: Awaiting definitive surgery   INTERVAL HISTORY: Alice [name] was seen today for follow up of her estrogen receptor positive breast cancer.  She continues on anastrozole with good tolerance. She reports continued hot flashes with no change with the anastrozole. She also notes bony  aches.  Since her last visit, results of her genetic testing returned. This revealed negative results and a variance of uncertain significance within APC and VHL.  Oncotype DX was obtained on the initial biopsy and the recurrence score of 26 predicts a risk of recurrence outside the breast over the next 9 years of 16%, if the patient's only systemic therapy is an antiestrogen for 5 years.  It also predicts a significant benefit from chemotherapy.  She is scheduled to undergo right mastectomy with sentinel lymph node biopsy on 10/22/2018 under Dr. Donne [name].   REVIEW OF SYSTEMS: Alice [name] reports feeling very unsure about taking chemotherapy. A detailed review of systems was otherwise entirely negative.   HISTORY OF CURRENT ILLNESS: From the original intake note:  Alice [name] has a history of remote right breast cancer in 1997, treated with right lumpectomy and radiation in [place].  The patient did not receive chemotherapy or antiestrogen treatments.  More recently she presented for her annual mammogram with a non-tender area of firmness in the right breast at 9 o'clock for 2 months. She underwent bilateral diagnostic mammography with tomography and right breast ultrasonography at [facility] on 09/08/2018 showing: breast density category A; scattered-appearing fibroglandular tissue at the lumpectomy site in the upper outer right breast middle depth spanning 3.4 cm, with indistinct margins, was not seen on prior mammogram from 2018.  There was also a keloid scar in the inferior medial left breast.  On physical exam there was a rectangular area of hardness measuring up to 7 cm in the right breast at 9:00, corresponding with the prior lumpectomy and consistent with radiation change.  Ultrasound of this area showed only postlumpectomy change, with no suspicious or discrete mass.  Ultrasound of the axilla was benign   Accordingly on 09/23/2018 she proceeded to biopsy of the  right breast area  in question. The pathology from this procedure S[facility] 20-07/02/2000 showed: invasive ductal carcinoma, E-cadherin positive,  grade 3,  Prognostic indicators significant for: estrogen receptor at 100% positive and progesterone receptor, 30% positive, both with strong staining intensity proliferation marker Ki67 at 70%. HER2 negative by immunohistochemistry (0).  The patient's subsequent history is as detailed below.   PAST MEDICAL HISTORY: Past Medical History:  Diagnosis Date   Anemia    Breast cancer ([place])    right in 1997   Family history of lung cancer    Family history of ovarian cancer    GERD (gastroesophageal reflux disease) 06/30/2018   Herpes simplex type 2 infection 02/2006   History of breast cancer 1997   History of esophagogastroduodenoscopy (EGD) 09/15/2015   small hiatal hernia was noted otherwise all was normal   Personal history of breast cancer     PAST SURGICAL HISTORY: Past Surgical History:  Procedure Laterality Date   BREAST ENHANCEMENT SURGERY     s/p cancer and surgery, right   BREAST LUMPECTOMY     right due to breast cancer  wisdom teeth removal   FAMILY HISTORY: Family History  Problem Relation Age of Onset   Ovarian cancer Sister 70   Dementia Mother    Aneurysm Father    Patient's father was 28 years old when he died from aneurysm. Patient's mother died from dementia at age 52. Her sister was diagnosed with ovarian cancer at age 3, and this subsequently took her life.  In total of the patient has 5 brothers and 2 sisters.  She is not aware of other cancers in the family  GYNECOLOGIC HISTORY:  No LMP recorded. Patient is postmenopausal. Menarche: 59 years old [place] P 0 LMP age 33 Contraceptive none HRT none  Hysterectomy? no BSO? 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 She is married. Husband Edmonia Caprio is from [place], so his native language is [place]. She  lives at home with her husband. They have no children. She attends a [employer] in [place].    ADVANCED DIRECTIVES: In the absence of documents to the contrary her husband is automatically her HCPOA.   HEALTH MAINTENANCE: Social History   Tobacco Use   Smoking status: Never Smoker   Smokeless tobacco: Never Used  Substance Use Topics   Alcohol use: No    Alcohol/week: 0.0 standard drinks   Drug use: No     Colonoscopy: endoscopy in 2017, colonoscopy due 2020 but delayed due to virus  PAP: 12/03/2017, normal  Bone density: never done   No Known Allergies  Current Outpatient Medications  Medication Sig Dispense Refill   anastrozole (ARIMIDEX) 1 MG tablet Take 1 tablet (1 mg total) by mouth daily. 30 tablet 2   No current facility-administered medications for this visit.     OBJECTIVE: Middle-aged African-American woman who appears stated age  There were no vitals filed for this visit.   There is no height or weight on file to calculate BMI.   Wt Readings from Last 3 Encounters:  09/30/18 196 lb 14.4 oz (89.3 kg)      ECOG FS:1 - Symptomatic but completely ambulatory   LAB RESULTS:  CMP     Component Value Date/Time   NA 139 09/30/2018 0819   K 3.7 09/30/2018 0819   CL 107 09/30/2018 0819   CO2 22 09/30/2018 0819   GLUCOSE 90 09/30/2018 0819  BUN 5 (L) 09/30/2018 0819   CREATININE 0.73 09/30/2018 0819   CALCIUM 9.0 09/30/2018 0819   PROT 7.9 09/30/2018 0819   ALBUMIN 4.0 09/30/2018 0819   AST 20 09/30/2018 0819   ALT 6 09/30/2018 0819   ALKPHOS 126 09/30/2018 0819   BILITOT 0.5 09/30/2018 0819   GFRNONAA >60 09/30/2018 0819   GFRAA >60 09/30/2018 0819    No results found for: TOTALPROTELP, ALBUMINELP, A1GS, A2GS, BETS, BETA2SER, GAMS, MSPIKE, SPEI  No results found for: Nils Pyle, [facility]  Lab Results  Component Value Date   WBC 6.7 09/30/2018   NEUTROABS 4.2 09/30/2018   HGB 13.1 09/30/2018   HCT 39.6 09/30/2018   MCV  95.4 09/30/2018   PLT 353 09/30/2018    _0 @  No results found for: LABCA2  No components found for: FUXNAT557  No results for input(s): INR in the last 168 hours.  No results found for: LABCA2  No results found for: DUK025  No results found for: KYH062  No results found for: BJS283  No results found for: CA2729  No components found for: HGQUANT  No results found for: CEA1 / No results found for: CEA1   No results found for: AFPTUMOR  No results found for: CHROMOGRNA  No results found for: PSA1  No visits with results within 3 Day(s) from this visit.  Latest known visit with results is:  Appointment on 09/30/2018  Component Date Value Ref Range Status   Sodium 09/30/2018 139  135 - 145 mmol/L Final   Potassium 09/30/2018 3.7  3.5 - 5.1 mmol/L Final   Chloride 09/30/2018 107  98 - 111 mmol/L Final   CO2 09/30/2018 22  22 - 32 mmol/L Final   Glucose, Bld 09/30/2018 90  70 - 99 mg/dL Final   BUN 09/30/2018 5* 6 - 20 mg/dL Final   Creatinine 09/30/2018 0.73  0.44 - 1.00 mg/dL Final   Calcium 09/30/2018 9.0  8.9 - 10.3 mg/dL Final   Total Protein 09/30/2018 7.9  6.5 - 8.1 g/dL Final   Albumin 09/30/2018 4.0  3.5 - 5.0 g/dL Final   AST 09/30/2018 20  15 - 41 U/L Final   ALT 09/30/2018 6  0 - 44 U/L Final   Alkaline Phosphatase 09/30/2018 126  38 - 126 U/L Final   Total Bilirubin 09/30/2018 0.5  0.3 - 1.2 mg/dL Final   GFR, Est Non Af Am 09/30/2018 >60  >60 mL/min Final   GFR, Est AFR Am 09/30/2018 >60  >60 mL/min Final   Anion gap 09/30/2018 10  5 - 15 Final   Performed at [facility], [place] [address] [place], [place] [address]   WBC Count 09/30/2018 6.7  4.0 - 10.5 K/uL Final   RBC 09/30/2018 4.15  3.87 - 5.11 MIL/uL Final   Hemoglobin 09/30/2018 13.1  12.0 - 15.0 g/dL Final   HCT 09/30/2018 39.6  36.0 - 46.0 % Final   MCV 09/30/2018 95.4  80.0 - 100.0 fL Final   MCH 09/30/2018 31.6  26.0 - 34.0 pg Final    MCHC 09/30/2018 33.1  30.0 - 36.0 g/dL Final   RDW 09/30/2018 12.4  11.5 - 15.5 % Final   Platelet Count 09/30/2018 353  150 - 400 K/uL Final   nRBC 09/30/2018 0.0  0.0 - 0.2 % Final   Neutrophils Relative % 09/30/2018 63  % Final   Neutro Abs 09/30/2018 4.2  1.7 - 7.7 K/uL Final   Lymphocytes Relative 09/30/2018 29  %  Final   Lymphs Abs 09/30/2018 2.0  0.7 - 4.0 K/uL Final   Monocytes Relative 09/30/2018 7  % Final   Monocytes Absolute 09/30/2018 0.5  0.1 - 1.0 K/uL Final   Eosinophils Relative 09/30/2018 0  % Final   Eosinophils Absolute 09/30/2018 0.0  0.0 - 0.5 K/uL Final   Basophils Relative 09/30/2018 1  % Final   Basophils Absolute 09/30/2018 0.1  0.0 - 0.1 K/uL Final   Immature Granulocytes 09/30/2018 0  % Final   Abs Immature Granulocytes 09/30/2018 0.01  0.00 - 0.07 K/uL Final   Performed at [facility], [place] [address], [place], [place] [address]    (this displays the last labs from the last 3 days)  No results found for: TOTALPROTELP, ALBUMINELP, A1GS, A2GS, BETS, BETA2SER, GAMS, MSPIKE, SPEI (this displays SPEP labs)  No results found for: KPAFRELGTCHN, LAMBDASER, KAPLAMBRATIO (kappa/lambda light chains)  No results found for: HGBA, HGBA2QUANT, HGBFQUANT, HGBSQUAN (Hemoglobinopathy evaluation)   No results found for: LDH  No results found for: IRON, TIBC, IRONPCTSAT (Iron and TIBC)  No results found for: FERRITIN  Urinalysis No results found for: COLORURINE, APPEARANCEUR, LABSPEC, PHURINE, GLUCOSEU, HGBUR, BILIRUBINUR, KETONESUR, PROTEINUR, UROBILINOGEN, NITRITE, LEUKOCYTESUR   STUDIES: No results found.  ELIGIBLE FOR AVAILABLE RESEARCH PROTOCOL: no  ASSESSMENT: 58 y.o. [place] woman   (1) status post right lumpectomy in 1997,  (a) status post adjuvant radiation  (b) did not receive antiestrogens or chemotherapy  (2) status post right breast upper outer quadrant biopsy 09/23/2018 for a clinical T2 N0, stage  IIA invasive ductal carcinoma, grade 3, estrogen and progesterone receptor positive, HER-2 not amplified, with an MIB-1 of 70%  (3) Oncotype obtained from the initial biopsy showed a score of 26 predicting a risk of recurrence outside the breast of 16/21% (depending on nodal status), if the patient's only systemic therapy is an antiestrogen for 5 years.  It also predicts a significant benefit from chemotherapy  (4) definitive surgery to follow  (5) adjuvant chemotherapy being considered by the patient,  (6) anastrozole started 09/30/2018  PLAN: I spent approximately 30 minutes face to face with Alice [name] with more than 50% of that time spent in counseling and coordination of care.  I reviewed the Oncotype results with the patient in detail.  She has done some reading and basically was going by the old Oncotype which clearly prescribed chemotherapy for patients with a score of 31 or greater and clearly no chemotherapy with a score of 18 or less.  Being between people of course were the problem.  Because of that [place] could not understand why we were recommending chemotherapy for a score of 26  We reviewed the fact that that data has been superseded by the tailor Rx study.  I let her know that that in between area is now much better defined and that women with a score of 26 or greater for 58 years old clearly will benefit from chemotherapy.  Even though the Oncotype suggests the chemotherapy benefit of greater than 15% I really do not think that is realistic.  I quoted her a benefit of chemotherapy in the 10% range assuming of course that she is node-negative.  If she is node positive the benefit would be correspondingly larger just as the risk would be correspondingly larger (21% as opposed to 16%)  She understands the percentage risk that we are quoting has nothing to do with local recurrence.  It has to do with recurrence in the  liver lungs or bones, namely stage IV disease, which when it  occurs is not curable.  She has been reading up on the Cytoxan/docetaxel chemotherapy and is horrified at the possible side effects  I mentioned that there are many other types of chemotherapy and certainly if she is willing to have a longer treatment.  We could do CMF, which is much better tolerated.  Most patients receive chemotherapy with CMF do not lose their hair and did not need on pro support for example.  They are pretty much able to maintain their normal or near normal functional status  At this point she is not sure what she wants to do.  She has not decided when we have the other.  She tells me she is going to call me on 10/19/2018 with a definite decision.  If she decides that she does want some kind of chemo then we will arrange to have a port placed at the time of treatment.  Otherwise in any case she is returning to see me on 11/01/2018 and we can go over the numbers again at that time  Alice Cruel, MD   10/15/2018 12:41 PM Medical Oncology and Hematology [facility] [place], [place] [address] Tel. [phone]    Fax. [phone]   This document serves as a record of services personally performed by Lurline Del, MD. It was created on his behalf by Alice [name], a trained medical scribe. The creation of this record is based on the scribe's personal observations and the provider's statements to them.   I, Lurline Del MD, have reviewed the above documentation for accuracy and completeness, and I agree with the above.

## 2018-10-14 NOTE — Telephone Encounter (Signed)
Left voicemail to confirm appt and to verify info.

## 2018-10-15 ENCOUNTER — Inpatient Hospital Stay (HOSPITAL_BASED_OUTPATIENT_CLINIC_OR_DEPARTMENT_OTHER): Payer: BC Managed Care – PPO | Admitting: Oncology

## 2018-10-15 DIAGNOSIS — Z79811 Long term (current) use of aromatase inhibitors: Secondary | ICD-10-CM

## 2018-10-15 DIAGNOSIS — C50411 Malignant neoplasm of upper-outer quadrant of right female breast: Secondary | ICD-10-CM

## 2018-10-15 DIAGNOSIS — Z170 Estrogen receptor positive status [ER+]: Secondary | ICD-10-CM | POA: Diagnosis not present

## 2018-10-15 NOTE — Progress Notes (Signed)
She has only seen you in our office. I think she may be your patient. Unclear.

## 2018-10-19 ENCOUNTER — Other Ambulatory Visit (HOSPITAL_COMMUNITY)
Admission: RE | Admit: 2018-10-19 | Discharge: 2018-10-19 | Disposition: A | Discharge status: Home / Self Care | Payer: BC Managed Care – PPO | Source: Ambulatory Visit | Attending: General Surgery | Admitting: General Surgery

## 2018-10-19 ENCOUNTER — Telehealth: Payer: Self-pay | Admitting: *Deleted

## 2018-10-19 DIAGNOSIS — Z1159 Encounter for screening for other viral diseases: Secondary | ICD-10-CM | POA: Insufficient documentation

## 2018-10-19 LAB — SARS CORONAVIRUS 2 (TAT 6-24 HRS): SARS Coronavirus 2: NEGATIVE

## 2018-10-19 MED ORDER — ENSURE PRE-SURGERY PO LIQD: 296.0000 mL | Freq: Once | ORAL | Status: DC

## 2018-10-19 NOTE — Progress Notes (Signed)
Ensure Pre-Surgery drink given to patient with instructions to complete by 0930 DOS.  Surgical scrub also given to patient with instructions for use.  Patient verbalized understanding of instructions.

## 2018-10-19 NOTE — Telephone Encounter (Signed)
Left message for a return phone call regarding her decision about chemotherapy.

## 2018-10-20 DIAGNOSIS — K219 Gastro-esophageal reflux disease without esophagitis: Secondary | ICD-10-CM | POA: Diagnosis not present

## 2018-10-20 DIAGNOSIS — K5900 Constipation, unspecified: Secondary | ICD-10-CM | POA: Diagnosis not present

## 2018-10-22 ENCOUNTER — Ambulatory Visit (HOSPITAL_BASED_OUTPATIENT_CLINIC_OR_DEPARTMENT_OTHER)
Admission: RE | Admit: 2018-10-22 | Discharge: 2018-10-23 | Disposition: A | Discharge status: Home / Self Care | Payer: BC Managed Care – PPO | Attending: General Surgery | Admitting: General Surgery

## 2018-10-22 ENCOUNTER — Encounter (HOSPITAL_COMMUNITY): Payer: BC Managed Care – PPO

## 2018-10-22 ENCOUNTER — Ambulatory Visit (HOSPITAL_BASED_OUTPATIENT_CLINIC_OR_DEPARTMENT_OTHER): Payer: BC Managed Care – PPO | Admitting: Anesthesiology

## 2018-10-22 ENCOUNTER — Other Ambulatory Visit: Payer: Self-pay

## 2018-10-22 ENCOUNTER — Ambulatory Visit (HOSPITAL_COMMUNITY)
Admission: RE | Admit: 2018-10-22 | Discharge: 2018-10-22 | Disposition: A | Discharge status: Home / Self Care | Payer: BC Managed Care – PPO | Source: Ambulatory Visit | Attending: General Surgery | Admitting: General Surgery

## 2018-10-22 ENCOUNTER — Encounter (HOSPITAL_BASED_OUTPATIENT_CLINIC_OR_DEPARTMENT_OTHER)
Admission: RE | Disposition: A | Discharge status: Home / Self Care | Payer: Self-pay | Source: Home / Self Care | Attending: General Surgery

## 2018-10-22 ENCOUNTER — Ambulatory Visit (HOSPITAL_COMMUNITY): Payer: BC Managed Care – PPO

## 2018-10-22 ENCOUNTER — Encounter (HOSPITAL_BASED_OUTPATIENT_CLINIC_OR_DEPARTMENT_OTHER): Payer: Self-pay | Admitting: Anesthesiology

## 2018-10-22 DIAGNOSIS — C50911 Malignant neoplasm of unspecified site of right female breast: Secondary | ICD-10-CM | POA: Diagnosis present

## 2018-10-22 DIAGNOSIS — Z170 Estrogen receptor positive status [ER+]: Secondary | ICD-10-CM

## 2018-10-22 DIAGNOSIS — G8918 Other acute postprocedural pain: Secondary | ICD-10-CM | POA: Diagnosis not present

## 2018-10-22 DIAGNOSIS — M1990 Unspecified osteoarthritis, unspecified site: Secondary | ICD-10-CM | POA: Insufficient documentation

## 2018-10-22 DIAGNOSIS — Z79811 Long term (current) use of aromatase inhibitors: Secondary | ICD-10-CM | POA: Insufficient documentation

## 2018-10-22 DIAGNOSIS — D649 Anemia, unspecified: Secondary | ICD-10-CM | POA: Diagnosis not present

## 2018-10-22 DIAGNOSIS — C50411 Malignant neoplasm of upper-outer quadrant of right female breast: Secondary | ICD-10-CM | POA: Insufficient documentation

## 2018-10-22 HISTORY — PX: MASTECTOMY W/ SENTINEL NODE BIOPSY: SHX2001

## 2018-10-22 SURGERY — MASTECTOMY WITH SENTINEL LYMPH NODE BIOPSY
Anesthesia: General | Site: Breast | Laterality: Right

## 2018-10-22 MED ORDER — CLONIDINE HCL (ANALGESIA) 100 MCG/ML EP SOLN
EPIDURAL | Status: DC | PRN
  Administered 2018-10-22: 100 ug

## 2018-10-22 MED ORDER — TECHNETIUM TC 99M SULFUR COLLOID FILTERED
1.0000 | Freq: Once | INTRAVENOUS | Status: AC | PRN
  Administered 2018-10-22: 1 via INTRADERMAL

## 2018-10-22 MED ORDER — ONDANSETRON 4 MG PO TBDP: 4.0000 mg | ORAL_TABLET | Freq: Four times a day (QID) | ORAL | Status: DC | PRN

## 2018-10-22 MED ORDER — MORPHINE SULFATE (PF) 4 MG/ML IV SOLN: 1.0000 mg | INTRAVENOUS | Status: DC | PRN

## 2018-10-22 MED ORDER — ONDANSETRON HCL 4 MG/2ML IJ SOLN: 4.0000 mg | Freq: Four times a day (QID) | INTRAMUSCULAR | Status: DC | PRN

## 2018-10-22 MED ORDER — SODIUM CHLORIDE (PF) 0.9 % IJ SOLN
INTRAVENOUS | Status: DC | PRN
  Administered 2018-10-22: 5 mL via SUBCUTANEOUS

## 2018-10-22 MED ORDER — SCOPOLAMINE 1 MG/3DAYS TD PT72: 1.0000 | MEDICATED_PATCH | Freq: Once | TRANSDERMAL | Status: DC

## 2018-10-22 MED ORDER — OXYCODONE HCL 5 MG/5ML PO SOLN: 5.0000 mg | Freq: Once | ORAL | Status: DC | PRN

## 2018-10-22 MED ORDER — CEFAZOLIN SODIUM-DEXTROSE 2-4 GM/100ML-% IV SOLN
INTRAVENOUS | Status: AC
  Filled 2018-10-22: qty 100

## 2018-10-22 MED ORDER — BUPIVACAINE HCL (PF) 0.25 % IJ SOLN
INTRAMUSCULAR | Status: AC
  Filled 2018-10-22: qty 30

## 2018-10-22 MED ORDER — METHOCARBAMOL 500 MG PO TABS
500.0000 mg | ORAL_TABLET | Freq: Four times a day (QID) | ORAL | Status: DC | PRN
  Administered 2018-10-22: 500 mg via ORAL
  Filled 2018-10-22: qty 1

## 2018-10-22 MED ORDER — KETOROLAC TROMETHAMINE 15 MG/ML IJ SOLN: 15.0000 mg | INTRAMUSCULAR | Status: DC

## 2018-10-22 MED ORDER — FENTANYL CITRATE (PF) 100 MCG/2ML IJ SOLN
INTRAMUSCULAR | Status: AC
  Filled 2018-10-22: qty 2

## 2018-10-22 MED ORDER — ONDANSETRON HCL 4 MG/2ML IJ SOLN
INTRAMUSCULAR | Status: DC | PRN
  Administered 2018-10-22: 4 mg via INTRAVENOUS

## 2018-10-22 MED ORDER — LIDOCAINE 2% (20 MG/ML) 5 ML SYRINGE
INTRAMUSCULAR | Status: AC
  Filled 2018-10-22: qty 5

## 2018-10-22 MED ORDER — ACETAMINOPHEN 500 MG PO TABS
ORAL_TABLET | ORAL | Status: AC
  Filled 2018-10-22: qty 2

## 2018-10-22 MED ORDER — ONDANSETRON HCL 4 MG/2ML IJ SOLN
INTRAMUSCULAR | Status: AC
  Filled 2018-10-22: qty 2

## 2018-10-22 MED ORDER — GABAPENTIN 100 MG PO CAPS
100.0000 mg | ORAL_CAPSULE | ORAL | Status: AC
  Administered 2018-10-22: 13:00:00 100 mg via ORAL

## 2018-10-22 MED ORDER — DEXAMETHASONE SODIUM PHOSPHATE 10 MG/ML IJ SOLN
INTRAMUSCULAR | Status: AC
  Filled 2018-10-22: qty 1

## 2018-10-22 MED ORDER — OXYCODONE HCL 5 MG PO TABS: 5.0000 mg | ORAL_TABLET | Freq: Once | ORAL | Status: DC | PRN

## 2018-10-22 MED ORDER — PROPOFOL 10 MG/ML IV BOLUS
INTRAVENOUS | Status: DC | PRN
  Administered 2018-10-22: 150 mg via INTRAVENOUS
  Administered 2018-10-22: 50 mg via INTRAVENOUS

## 2018-10-22 MED ORDER — SODIUM CHLORIDE (PF) 0.9 % IJ SOLN
INTRAMUSCULAR | Status: AC
  Filled 2018-10-22: qty 10

## 2018-10-22 MED ORDER — ACETAMINOPHEN 500 MG PO TABS
1000.0000 mg | ORAL_TABLET | Freq: Four times a day (QID) | ORAL | Status: DC
  Administered 2018-10-22: 1000 mg via ORAL
  Filled 2018-10-22: qty 2

## 2018-10-22 MED ORDER — KETOROLAC TROMETHAMINE 15 MG/ML IJ SOLN
15.0000 mg | Freq: Four times a day (QID) | INTRAMUSCULAR | Status: DC | PRN
  Administered 2018-10-22: 15 mg via INTRAVENOUS
  Filled 2018-10-22: qty 1

## 2018-10-22 MED ORDER — CHLORHEXIDINE GLUCONATE CLOTH 2 % EX PADS: 6.0000 | MEDICATED_PAD | Freq: Once | CUTANEOUS | Status: DC

## 2018-10-22 MED ORDER — CEFAZOLIN SODIUM-DEXTROSE 2-4 GM/100ML-% IV SOLN
2.0000 g | INTRAVENOUS | Status: AC
  Administered 2018-10-22: 2 g via INTRAVENOUS

## 2018-10-22 MED ORDER — 0.9 % SODIUM CHLORIDE (POUR BTL) OPTIME
TOPICAL | Status: DC | PRN
  Administered 2018-10-22: 600 mL

## 2018-10-22 MED ORDER — KETOROLAC TROMETHAMINE 15 MG/ML IJ SOLN
INTRAMUSCULAR | Status: AC
  Filled 2018-10-22: qty 1

## 2018-10-22 MED ORDER — GABAPENTIN 100 MG PO CAPS
ORAL_CAPSULE | ORAL | Status: AC
  Filled 2018-10-22: qty 1

## 2018-10-22 MED ORDER — ACETAMINOPHEN 500 MG PO TABS
1000.0000 mg | ORAL_TABLET | ORAL | Status: AC
  Administered 2018-10-22: 1000 mg via ORAL

## 2018-10-22 MED ORDER — BUPIVACAINE-EPINEPHRINE (PF) 0.5% -1:200000 IJ SOLN
INTRAMUSCULAR | Status: DC | PRN
  Administered 2018-10-22: 30 mL via PERINEURAL

## 2018-10-22 MED ORDER — DEXAMETHASONE SODIUM PHOSPHATE 4 MG/ML IJ SOLN
INTRAMUSCULAR | Status: DC | PRN
  Administered 2018-10-22: 10 mg via INTRAVENOUS

## 2018-10-22 MED ORDER — FENTANYL CITRATE (PF) 100 MCG/2ML IJ SOLN
50.0000 ug | INTRAMUSCULAR | Status: AC | PRN
  Administered 2018-10-22: 25 ug via INTRAVENOUS
  Administered 2018-10-22: 14:00:00 via INTRAVENOUS
  Administered 2018-10-22 (×3): 25 ug via INTRAVENOUS
  Administered 2018-10-22 (×2): 50 ug via INTRAVENOUS

## 2018-10-22 MED ORDER — PROPOFOL 10 MG/ML IV BOLUS
INTRAVENOUS | Status: AC
  Filled 2018-10-22: qty 20

## 2018-10-22 MED ORDER — MIDAZOLAM HCL 2 MG/2ML IJ SOLN
INTRAMUSCULAR | Status: AC
  Filled 2018-10-22: qty 2

## 2018-10-22 MED ORDER — FENTANYL CITRATE (PF) 100 MCG/2ML IJ SOLN: 25.0000 ug | INTRAMUSCULAR | Status: DC | PRN

## 2018-10-22 MED ORDER — LACTATED RINGERS IV SOLN
INTRAVENOUS | Status: DC
  Administered 2018-10-22 (×2): via INTRAVENOUS

## 2018-10-22 MED ORDER — SIMETHICONE 80 MG PO CHEW: 40.0000 mg | CHEWABLE_TABLET | Freq: Four times a day (QID) | ORAL | Status: DC | PRN

## 2018-10-22 MED ORDER — METHYLENE BLUE 0.5 % INJ SOLN
INTRAVENOUS | Status: AC
  Filled 2018-10-22: qty 10

## 2018-10-22 MED ORDER — OXYCODONE HCL 5 MG PO TABS: 5.0000 mg | ORAL_TABLET | ORAL | Status: DC | PRN

## 2018-10-22 MED ORDER — MIDAZOLAM HCL 2 MG/2ML IJ SOLN
1.0000 mg | INTRAMUSCULAR | Status: DC | PRN
  Administered 2018-10-22: 2 mg via INTRAVENOUS

## 2018-10-22 MED ORDER — ONDANSETRON HCL 4 MG/2ML IJ SOLN: 4.0000 mg | Freq: Once | INTRAMUSCULAR | Status: DC | PRN

## 2018-10-22 MED ORDER — SODIUM CHLORIDE 0.9 % IV SOLN
INTRAVENOUS | Status: DC
  Administered 2018-10-22: 17:00:00 via INTRAVENOUS

## 2018-10-22 MED ORDER — BUPIVACAINE HCL (PF) 0.25 % IJ SOLN
INTRAMUSCULAR | Status: DC | PRN
  Administered 2018-10-22: 10 mL

## 2018-10-22 MED ORDER — PHENYLEPHRINE HCL (PRESSORS) 10 MG/ML IV SOLN
INTRAVENOUS | Status: DC | PRN
  Administered 2018-10-22 (×2): 80 ug via INTRAVENOUS

## 2018-10-22 MED ORDER — LIDOCAINE HCL (CARDIAC) PF 100 MG/5ML IV SOSY
PREFILLED_SYRINGE | INTRAVENOUS | Status: DC | PRN
  Administered 2018-10-22: 40 mg via INTRAVENOUS

## 2018-10-22 SURGICAL SUPPLY — 74 items
APPLIER CLIP 11 MED OPEN (CLIP)
APPLIER CLIP 9.375 MED OPEN (MISCELLANEOUS) ×2
BENZOIN TINCTURE PRP APPL 2/3 (GAUZE/BANDAGES/DRESSINGS) IMPLANT
BINDER BREAST LRG (GAUZE/BANDAGES/DRESSINGS) IMPLANT
BINDER BREAST MEDIUM (GAUZE/BANDAGES/DRESSINGS) IMPLANT
BINDER BREAST XLRG (GAUZE/BANDAGES/DRESSINGS) ×1 IMPLANT
BINDER BREAST XXLRG (GAUZE/BANDAGES/DRESSINGS) IMPLANT
BIOPATCH RED 1 DISK 7.0 (GAUZE/BANDAGES/DRESSINGS) ×1 IMPLANT
BLADE CLIPPER SURG (BLADE) IMPLANT
BLADE HEX COATED 2.75 (ELECTRODE) ×1 IMPLANT
BLADE SURG 10 STRL SS (BLADE) ×2 IMPLANT
BLADE SURG 15 STRL LF DISP TIS (BLADE) ×1 IMPLANT
BLADE SURG 15 STRL SS (BLADE) ×1
CANISTER SUCT 1200ML W/VALVE (MISCELLANEOUS) ×2 IMPLANT
CHLORAPREP W/TINT 26 (MISCELLANEOUS) ×2 IMPLANT
CLIP APPLIE 11 MED OPEN (CLIP) IMPLANT
CLIP APPLIE 9.375 MED OPEN (MISCELLANEOUS) IMPLANT
CLIP VESOCCLUDE SM WIDE 6/CT (CLIP) IMPLANT
COVER BACK TABLE REUSABLE LG (DRAPES) ×2 IMPLANT
COVER MAYO STAND REUSABLE (DRAPES) ×2 IMPLANT
COVER PROBE W GEL 5X96 (DRAPES) ×2 IMPLANT
COVER WAND RF STERILE (DRAPES) IMPLANT
DECANTER SPIKE VIAL GLASS SM (MISCELLANEOUS) IMPLANT
DERMABOND ADVANCED (GAUZE/BANDAGES/DRESSINGS) ×2
DERMABOND ADVANCED .7 DNX12 (GAUZE/BANDAGES/DRESSINGS) ×1 IMPLANT
DRAIN CHANNEL 19F RND (DRAIN) ×2 IMPLANT
DRAPE TOP ARMCOVERS (MISCELLANEOUS) ×2 IMPLANT
DRAPE U-SHAPE 76X120 STRL (DRAPES) ×2 IMPLANT
DRAPE UTILITY XL STRL (DRAPES) ×2 IMPLANT
DRSG PAD ABDOMINAL 8X10 ST (GAUZE/BANDAGES/DRESSINGS) ×2 IMPLANT
DRSG TEGADERM 4X4.75 (GAUZE/BANDAGES/DRESSINGS) ×1 IMPLANT
ELECT BLADE 4.0 EZ CLEAN MEGAD (MISCELLANEOUS)
ELECT REM PT RETURN 9FT ADLT (ELECTROSURGICAL) ×2
ELECTRODE BLDE 4.0 EZ CLN MEGD (MISCELLANEOUS) IMPLANT
ELECTRODE REM PT RTRN 9FT ADLT (ELECTROSURGICAL) ×1 IMPLANT
EVACUATOR SILICONE 100CC (DRAIN) ×2 IMPLANT
GAUZE SPONGE 4X4 12PLY STRL LF (GAUZE/BANDAGES/DRESSINGS) ×1 IMPLANT
GLOVE BIO SURGEON STRL SZ7 (GLOVE) ×3 IMPLANT
GLOVE BIOGEL PI IND STRL 7.0 (GLOVE) IMPLANT
GLOVE BIOGEL PI IND STRL 7.5 (GLOVE) ×1 IMPLANT
GLOVE BIOGEL PI INDICATOR 7.0 (GLOVE) ×1
GLOVE BIOGEL PI INDICATOR 7.5 (GLOVE) ×2
GOWN STRL REUS W/ TWL LRG LVL3 (GOWN DISPOSABLE) ×3 IMPLANT
GOWN STRL REUS W/TWL LRG LVL3 (GOWN DISPOSABLE) ×2
HEMOSTAT ARISTA ABSORB 3G PWDR (HEMOSTASIS) ×2 IMPLANT
ILLUMINATOR WAVEGUIDE N/F (MISCELLANEOUS) IMPLANT
LIGHT WAVEGUIDE WIDE FLAT (MISCELLANEOUS) ×2 IMPLANT
NDL HYPO 25X1 1.5 SAFETY (NEEDLE) IMPLANT
NDL SAFETY ECLIPSE 18X1.5 (NEEDLE) IMPLANT
NEEDLE HYPO 18GX1.5 SHARP (NEEDLE) ×1
NEEDLE HYPO 25X1 1.5 SAFETY (NEEDLE) ×4 IMPLANT
NS IRRIG 1000ML POUR BTL (IV SOLUTION) ×2 IMPLANT
PACK BASIN DAY SURGERY FS (CUSTOM PROCEDURE TRAY) ×2 IMPLANT
PENCIL BUTTON HOLSTER BLD 10FT (ELECTRODE) ×2 IMPLANT
PIN SAFETY STERILE (MISCELLANEOUS) ×2 IMPLANT
SHEET MEDIUM DRAPE 40X70 STRL (DRAPES) IMPLANT
SLEEVE SCD COMPRESS KNEE MED (MISCELLANEOUS) ×2 IMPLANT
SPONGE LAP 18X18 RF (DISPOSABLE) ×3 IMPLANT
SPONGE LAP 4X18 RFD (DISPOSABLE) IMPLANT
STRIP CLOSURE SKIN 1/2X4 (GAUZE/BANDAGES/DRESSINGS) ×2 IMPLANT
SUT ETHILON 2 0 FS 18 (SUTURE) ×2 IMPLANT
SUT MNCRL AB 4-0 PS2 18 (SUTURE) ×1 IMPLANT
SUT SILK 2 0 SH (SUTURE) IMPLANT
SUT VIC AB 2-0 SH 27 (SUTURE) ×2
SUT VIC AB 2-0 SH 27XBRD (SUTURE) IMPLANT
SUT VIC AB 3-0 54X BRD REEL (SUTURE) IMPLANT
SUT VIC AB 3-0 BRD 54 (SUTURE)
SUT VIC AB 3-0 SH 27 (SUTURE)
SUT VIC AB 3-0 SH 27X BRD (SUTURE) IMPLANT
SUT VICRYL 3-0 CR8 SH (SUTURE) ×3 IMPLANT
SYR CONTROL 10ML LL (SYRINGE) ×2 IMPLANT
TOWEL GREEN STERILE FF (TOWEL DISPOSABLE) ×4 IMPLANT
TUBE CONNECTING 20X1/4 (TUBING) ×2 IMPLANT
YANKAUER SUCT BULB TIP NO VENT (SUCTIONS) ×2 IMPLANT

## 2018-10-22 NOTE — Transfer of Care (Signed)
Immediate Anesthesia Transfer of Care Note  Patient: Alice [name]  Procedure(s) Performed: RIGHT MASTECTOMY WITH RIGHT AXILLARY SENTINEL LYMPH NODE BIOPSY (Right Breast)  Patient Location: PACU  Anesthesia Type:GA combined with regional for post-op pain  Level of Consciousness: sedated  Airway & Oxygen Therapy: Patient Spontanous Breathing and Patient connected to nasal cannula oxygen  Post-op Assessment: Report given to RN and Post -op Vital signs reviewed and stable  Post vital signs: Reviewed and stable  Last Vitals:  Vitals Value Taken Time  BP    Temp    Pulse    Resp    SpO2      Last Pain:  Vitals:   10/22/18 1345  TempSrc:   PainSc: 0-No pain      Patients Stated Pain Goal: 4 (99/06/89 3406)  Complications: No apparent anesthesia complications

## 2018-10-22 NOTE — Progress Notes (Signed)
Assisted Dr. Foster with right, ultrasound guided, pectoralis block. Side rails up, monitors on throughout procedure. See vital signs in flow sheet. Tolerated Procedure well. 

## 2018-10-22 NOTE — Anesthesia Preprocedure Evaluation (Signed)
Anesthesia Evaluation  Patient identified by MRN, date of birth, ID band Patient awake    Reviewed: Allergy & Precautions, NPO status , Patient's Chart, lab work & pertinent test results  Airway Mallampati: II  TM Distance: >3 FB Neck ROM: Full    Dental no notable dental hx. (+) Teeth Intact   Pulmonary neg pulmonary ROS,    Pulmonary exam normal breath sounds clear to auscultation       Cardiovascular negative cardio ROS Normal cardiovascular exam Rhythm:Regular Rate:Normal     Neuro/Psych negative neurological ROS  negative psych ROS   GI/Hepatic Neg liver ROS, GERD  Controlled,  Endo/Other  negative endocrine ROSRecurrent right breast Ca  Renal/GU negative Renal ROS  negative genitourinary   Musculoskeletal negative musculoskeletal ROS (+)   Abdominal   Peds  Hematology  (+) anemia ,   Anesthesia Other Findings   Reproductive/Obstetrics                             Anesthesia Physical Anesthesia Plan  ASA: II  Anesthesia Plan: General   Post-op Pain Management:  Regional for Post-op pain   Induction: Intravenous  PONV Risk Score and Plan: 3 and Midazolam, Ondansetron, Dexamethasone and Treatment may vary due to age or medical condition  Airway Management Planned: LMA  Additional Equipment:   Intra-op Plan:   Post-operative Plan: Extubation in OR  Informed Consent: I have reviewed the patients History and Physical, chart, labs and discussed the procedure including the risks, benefits and alternatives for the proposed anesthesia with the patient or authorized representative who has indicated his/her understanding and acceptance.     Dental advisory given  Plan Discussed with: CRNA and Surgeon  Anesthesia Plan Comments:         Anesthesia Quick Evaluation

## 2018-10-22 NOTE — Op Note (Addendum)
Preoperative diagnosis:clinical stage II right breast cancer, recurrent Postoperative diagnosis: same as above Procedure: 1. Right mastectomy 2. Right deep axillary sentinel node biopsy 3. Injection blue dye for sentinel node identification Surgeon: Dr Serita Grammes EBL:25cc [phone] Fr Keenan Bachelor drain Specimens: 1. Right breast marked short superior, long lateral 2. Right deep axillary sentinel nodes  Complications none Sponge and needle count correct times two dispo to recovery stable  Indications:57 yof with recurrent right breast cancer. she has prior right breast cancer that I think was dcis. she underwent lumpectomy with [facility] in [facility] (I dont think they did nodes- I cannot find scar). she has done well since then. she has ruoq firmness felt in this area. no dc. she underwent evaluation and nothing really identified on mm except a density at site. on [place] there is a 2.6 x 3.8 cm, ax [place] is negative. biopsy of the breast is grade III IDC that is er/pr pos, her 2 negative Ki is 70%. We discussed options and have decided to proceed with right mastectomy, right axillary sn biopsy. She declined reconstruction.   Procedure: After informed consent was obtained the patient first underwent a pectoral block and was injected with technetium in the standard periareolar fashion on the right. She was given antibiotics. SCDs were in place. She was then placed under general anesthesia without complication. She was prepped and draped in the standard sterile surgical fashion. A surgical timeout was then performed.  I first injected 5 cc of a methylene blue saline mixture in the right periareolar position and massaged this.  I made an elliptical incision on the right side including the nac. I created flaps to the clavicle, parasternal region, IM fold and latissimus laterally.  I then removedthe breast and the pectoralis fashion from the pectoralis muscle.  I then was in the  axilla. I identified a couple radioactive nodes. I removed these through the same incision and sent them off separately. There were no palpable nodes. There was no background radioactivity. I then obtained hemostasis.I then placed a 19 Fr Blake drain and secured this with a 2-0 nylon. I then closed the lateral skin to the chest wall using 2-0 vicryl. The skin was closed with 3-0 vicryl and 4-0 monocryl. Glue and steristrips were applied.  I attempted to call her husband postoperatively as she requested at all numbers listed and there was no answer

## 2018-10-22 NOTE — Anesthesia Postprocedure Evaluation (Signed)
Anesthesia Post Note  Patient: Alice [name]  Procedure(s) Performed: RIGHT MASTECTOMY WITH RIGHT AXILLARY SENTINEL LYMPH NODE BIOPSY (Right Breast)     Patient location during evaluation: PACU Anesthesia Type: General Level of consciousness: awake and alert Pain management: pain level controlled Vital Signs Assessment: post-procedure vital signs reviewed and stable Respiratory status: spontaneous breathing, nonlabored ventilation and respiratory function stable Cardiovascular status: blood pressure returned to baseline and stable Postop Assessment: no apparent nausea or vomiting Anesthetic complications: no    Last Vitals:  Vitals:   10/22/18 1531 10/22/18 1545  BP: 101/65 106/69  Pulse: 92 99  Resp: 15 12  Temp: 36.4 C   SpO2: 100% 100%    Last Pain:  Vitals:   10/22/18 1545  TempSrc:   PainSc: 0-No pain                 Zekiah Coen A.

## 2018-10-22 NOTE — H&P (Signed)
56 yof referred by Dr Jana Hakim for recurrent right breast cancer. she has prior right breast cancer that I think was dcis. she underwent lumpectomy with radiotherapy at [facility] in 1997 (I dont think they did nodes- I cannot find scar). she has done well since then. she has ruoq firmness felt in this area. no dc. she underwent evaluation and nothing really identified on mm except a density at site. on [place] there is a 2.6 x 3.8 cm, ax [place] is negative. biopsy of the breast is grade III IDC that is er/pr pos, her 2 negative Ki is 70%. shelives in [place] and works as Passenger transport manager for [employer]. she lives with her husband , no kids. she is here to discuss options   Past Surgical History Tawni Pummel, RN; 09/30/2018 7:25 AM) Breast Biopsy  Right. Breast Reconstruction  Bilateral. Oral Surgery   Diagnostic Studies History Tawni Pummel, RN; 09/30/2018 7:25 AM) Colonoscopy  1-5 years ago Mammogram  within last year Pap Smear  1-5 years ago  Medication History Tawni Pummel, RN; 09/30/2018 7:25 AM) Medications Reconciled  Social History Tawni Pummel, RN; 09/30/2018 7:25 AM) No alcohol use  No caffeine use  No drug use  Tobacco use  Never smoker.  Family History Tawni Pummel, RN; 09/30/2018 7:25 AM) Diabetes Mellitus  Mother. Hypertension  Brother. Ovarian Cancer  Sister.  Pregnancy / Birth History Tawni Pummel, RN; 09/30/2018 7:25 AM) Age at menarche  64 years. Age of men[age]  [age] Irregular periods   Other Problems Tawni Pummel, RN; 09/30/2018 7:25 AM) Arthritis  Gastroesophageal Reflux Disease    Review of Systems Sunday Spillers Ledford RN; 09/30/2018 7:25 AM) General Not Present- Appetite Loss, Chills, Fatigue, Fever, Night Sweats, Weight Gain and Weight Loss. Skin Not Present- Change in Wart/Mole, Dryness, Hives, Jaundice, New Lesions, Non-Healing Wounds, Rash and Ulcer. HEENT Not Present- Earache, Hearing Loss, Hoarseness, Nose Bleed, Oral Ulcers, Ringing  in the Ears, Seasonal Allergies, Sinus Pain, Sore Throat, Visual Disturbances, Wears glasses/contact lenses and Yellow Eyes. Respiratory Not Present- Bloody sputum, Chronic Cough, Difficulty Breathing, Snoring and Wheezing. Breast Not Present- Breast Mass, Breast Pain, Nipple Discharge and Skin Changes. Cardiovascular Not Present- Chest Pain, Difficulty Breathing Lying Down, Leg Cramps, Palpitations, Rapid Heart Rate, Shortness of Breath and Swelling of Extremities. Gastrointestinal Not Present- Abdominal Pain, Bloating, Bloody Stool, Change in Bowel Habits, Chronic diarrhea, Constipation, Difficulty Swallowing, Excessive gas, Gets full quickly at meals, Hemorrhoids, Indigestion, Nausea, Rectal Pain and Vomiting. Female Genitourinary Not Present- Frequency, Nocturia, Painful Urination, Pelvic Pain and Urgency. Musculoskeletal Not Present- Back Pain, Joint Pain, Joint Stiffness, Muscle Pain, Muscle Weakness and Swelling of Extremities. Neurological Not Present- Decreased Memory, Fainting, Headaches, Numbness, Seizures, Tingling, Tremor, Trouble walking and Weakness. Psychiatric Not Present- Anxiety, Bipolar, Change in Sleep Pattern, Depression, Fearful and Frequent crying. Endocrine Not Present- Cold Intolerance, Excessive Hunger, Hair Changes, Heat Intolerance, Hot flashes and New Diabetes. Hematology Not Present- Blood Thinners, Easy Bruising, Excessive bleeding, Gland problems, HIV and Persistent Infections.   Physical Exam Rolm Bookbinder MD; 09/30/2018 2:04 PM) General Mental Status-Alert. Integumentary Global Assessment Upon inspection and palpation of skin surfaces of the - Head/Face: no rashes, ulcers, lesions or evidence of photo damage. No palpable nodules or masses. Head and Neck Trachea-midline. Thyroid Gland Characteristics - normal size and consistency. Eye Sclera/Conjunctiva - Bilateral-No scleral icterus. Chest and Lung Exam Chest and lung exam reveals -quiet, even  and easy respiratory effort with no use of accessory muscles. Breast Nipples-No Discharge. Note: vague 2-3 cm area  of thickening near old scar Cardiovascular Cardiovascular examination reveals -normal heart sounds, regular rate and rhythm with no murmurs. Abdomen Note: soft nt no hm Neurologic Neurologic evaluation reveals -alert and oriented x 3 with no impairment of recent or remote memory. Lymphatic Head & Neck General Head & Neck Lymphatics: Bilateral - Description - Normal. Axillary General Axillary Region: Bilateral - Description - Normal. Note: no [place] adenopathy  Assessment & Plan Rolm Bookbinder MD; 09/30/2018 2:10 PM)  RECURRENT BREAST CANCER, RIGHT (C50.911) Story: Right mastectomy, right axillary sentinel node biopsy,she wants to hold on port placement until final path is back.   Staging and pathophysiology of breast cancer and all available treatment options. I will attempt a sn biopsy. i do not think she has had nodal surgery before. this may be difficult due to prior surgery with lump/xrt and reduction. if nothing localizes will abort procedure. discussed risk of lymphedema and shoulder rom dysfunction. I think she needs mastectomy as well given prior lump/xrt and size of lesion. we discussed reconstruction and she will see plastic surgery although she is leaning against it now. discussed mastectomy incision, appearance, small risk of cancer even after mastectomy, drains, incisional complications. discussed prosthetic. I also discussed port placement if indicated with oncotype she asked about contralateral rrm and I told her if genetics not positive does not have any benefit and she was ok with that.

## 2018-10-22 NOTE — Interval H&P Note (Signed)
History and Physical Interval Note:  10/22/2018 1:17 PM  Alice [name]  has presented today for surgery, with the diagnosis of RECURRENT RIGHT BREAST CANCER.  The various methods of treatment have been discussed with the patient and family. After consideration of risks, benefits and other options for treatment, the patient has consented to  Procedure(s): RIGHT MASTECTOMY WITH RIGHT AXILLARY SENTINEL LYMPH NODE BIOPSY (Right) as a surgical intervention.  The patient's history has been reviewed, patient examined, no change in status, stable for surgery.  I have reviewed the patient's chart and labs.  Questions were answered to the patient's satisfaction.     Rolm Bookbinder

## 2018-10-22 NOTE — Anesthesia Procedure Notes (Signed)
Anesthesia Regional Block: Pectoralis block   Pre-Anesthetic Checklist: ,, timeout performed, Correct Patient, Correct Site, Correct Laterality, Correct Procedure, Correct Position, site marked, Risks and benefits discussed,  Surgical consent,  Pre-op evaluation,  At surgeon's request and post-op pain management  Laterality: Right  Prep: chloraprep       Needles:  Injection technique: Single-shot  Needle Type: Echogenic Stimulator Needle     Needle Length: 9cm  Needle Gauge: 21   Needle insertion depth: 7 cm   Additional Needles:   Procedures:,,,, ultrasound used (permanent image in chart),,,,  Narrative:  Start time: 10/22/2018 1:30 PM End time: 10/22/2018 1:35 PM Injection made incrementally with aspirations every 5 mL.  Performed by: Personally  Anesthesiologist: Josephine Igo, MD  Additional Notes:  Timeout performed. Patient sedated. Relevant anatomy ID'd using [place]. Incremental 2-96ml injection of LA with frequent aspiration. Patient tolerated procedure well.        Right Pectoralis Block

## 2018-10-22 NOTE — Anesthesia Procedure Notes (Signed)
Procedure Name: LMA Insertion Date/Time: 10/22/2018 2:00 PM Performed by: Maryella Shivers, CRNA Pre-anesthesia Checklist: Patient identified, Emergency Drugs available, Suction available and Patient being monitored Patient Re-evaluated:Patient Re-evaluated prior to induction Oxygen Delivery Method: Circle system utilized Preoxygenation: Pre-oxygenation with 100% oxygen Induction Type: IV induction Ventilation: Mask ventilation without difficulty LMA: LMA inserted LMA Size: 4.0 Number of attempts: 1 Airway Equipment and Method: Bite block Placement Confirmation: positive ETCO2 Tube secured with: Tape Dental Injury: Teeth and Oropharynx as per pre-operative assessment

## 2018-10-23 ENCOUNTER — Encounter (HOSPITAL_BASED_OUTPATIENT_CLINIC_OR_DEPARTMENT_OTHER): Payer: Self-pay | Admitting: General Surgery

## 2018-10-23 MED ORDER — OXYCODONE HCL 5 MG PO TABS: 5.0000 mg | ORAL_TABLET | ORAL | 0 refills | Status: DC | PRN

## 2018-10-23 NOTE — Discharge Instructions (Signed)
[facility], [place] [phone] Do not need to restart anastrozole until we discuss it in follow up MASTECTOMY: POST OP INSTRUCTIONS Take 400 mg of ibuprofen every 8 hours or 650 mg tylenol every 6 hours for next 72 hours then as needed. Use ice several times daily also. Always review your discharge instruction sheet given to you by the facility where your surgery was performed. IF YOU HAVE DISABILITY OR FAMILY LEAVE FORMS, YOU MUST BRING THEM TO THE OFFICE FOR PROCESSING.   DO NOT GIVE THEM TO YOUR DOCTOR. A prescription for pain medication may be given to you upon discharge.  Take your pain medication as prescribed, if needed.  If narcotic pain medicine is not needed, then you may take acetaminophen (Tylenol), naprosyn (Alleve) or ibuprofen (Advil) as needed. 1. Take your usually prescribed medications unless otherwise directed. 2. If you need a refill on your pain medication, please contact your pharmacy.  They will contact our office to request authorization.  Prescriptions will not be filled after 5pm or on week-ends. 3. You should follow a light diet the first few days after arrival home, such as soup and crackers, etc.  Resume your normal diet the day after surgery. 4. Most patients will experience some swelling and bruising on the chest and underarm.  Ice packs will help.  Swelling and bruising can take several days to resolve. Wear the binder day and night until you return to the office.  5. It is common to experience some constipation if taking pain medication after surgery.  Increasing fluid intake and taking a stool softener (such as Colace) will usually help or prevent this problem from occurring.  A mild laxative (Milk of Magnesia or Miralax) should be taken according to package instructions if there are no bowel movements after 48 hours. 6. Unless discharge instructions indicate otherwise, leave your bandage dry and in place until your next appointment in 3-5 days.  You may  take a limited sponge bath.  No tube baths or showers until the drains are removed.  You may have steri-strips (small skin tapes) in place directly over the incision.  These strips should be left on the skin for 7-10 days. If you have glue it will come off in next couple week.  Any sutures will be removed at an office visit 7. DRAINS:  If you have drains in place, it is important to keep a list of the amount of drainage produced each day in your drains.  Before leaving the hospital, you should be instructed on drain care.  Call our office if you have any questions about your drains. I will remove your drains when they put out less than 30 cc or ml for 2 consecutive days. 8. ACTIVITIES:  You may resume regular (light) daily activities beginning the next day--such as daily self-care, walking, climbing stairs--gradually increasing activities as tolerated.  You may have sexual intercourse when it is comfortable.  Refrain from any heavy lifting or straining until approved by your doctor. a. You may drive when you are no longer taking prescription pain medication, you can comfortably wear a seatbelt, and you can safely maneuver your car and apply brakes. b. RETURN TO WORK:  __________________________________________________________ 9. You should see your doctor in the office for a follow-up appointment approximately 3-5 days after your surgery.  Your doctors nurse will typically make your follow-up appointment when she calls you with your pathology report.  Expect your pathology report 3-4business days after surgery. 10. OTHER INSTRUCTIONS: ______________________________________________________________________________________________  ____________________________________________________________________________________________ WHEN TO CALL YOUR DR WAKEFIELD: 1. Fever over 101.0 2. Nausea and/or vomiting 3. Extreme swelling or bruising 4. Continued bleeding from incision. 5. Increased pain, redness, or drainage  from the incision. The clinic staff is available to answer your questions during regular business hours.  Please dont hesitate to call and ask to speak to one of the nurses for clinical concerns.  If you have a medical emergency, go to the nearest emergency room or call 911.  A surgeon from [facility] is always on call at the hospital. [address] [place], [place], [place]  [address] ? [address], [place], [place]   [address] [phone] ? [phone] ? FAX [phone] Web site: [email or web address]     About my Jackson-Pratt Bulb Drain  What is a Jackson-Pratt bulb? A Jackson-Pratt is a soft, round device used to collect drainage. It is connected to a long, thin drainage catheter, which is held in place by one or two small stiches near your surgical incision site. When the bulb is squeezed, it forms a vacuum, forcing the drainage to empty into the bulb.  Emptying the Jackson-Pratt bulb- To empty the bulb: 1. Release the plug on the top of the bulb. 2. Pour the bulb's contents into a measuring container which your nurse will provide. 3. Record the time emptied and amount of drainage. Empty the drain(s) as often as your     doctor or nurse recommends.  Date                  Time                    Amount (Drain 1)                 Amount (Drain 2)  _____________________________________________________________________  _____________________________________________________________________  _____________________________________________________________________  _____________________________________________________________________  _____________________________________________________________________  _____________________________________________________________________  _____________________________________________________________________  _____________________________________________________________________  Squeezing the Jackson-Pratt  Bulb- To squeeze the bulb: 1. Make sure the plug at the top of the bulb is open. 2. Squeeze the bulb tightly in your fist. You will hear air squeezing from the bulb. 3. Replace the plug while the bulb is squeezed. 4. Use a safety pin to attach the bulb to your clothing. This will keep the catheter from     pulling at the bulb insertion site.  When to call your doctor- Call your doctor if:  Drain site becomes red, swollen or hot.  You have a fever greater than 101 degrees F.  There is oozing at the drain site.  Drain falls out (apply a guaze bandage over the drain hole and secure it with tape).  Drainage increases daily not related to activity patterns. (You will usually have more drainage when you are active than when you are resting.)  Drainage has a bad odor.

## 2018-10-23 NOTE — Discharge Summary (Signed)
Physician Discharge Summary  Patient ID: Alice [name] MRN: [id number] DOB/AGE: Jul 04, 1960 58 y.o.  Admit date: 10/22/2018 Discharge date: 10/23/2018  Admission Diagnoses: Recurrent right breast cancer  Discharge Diagnoses:  Active Problems:   Breast cancer, right [facility])   Discharged Condition: good  Hospital Course: 58 yof with recurrent right breast cancer. Has oncotype of 26 on core. Would like to wait on decision for chemo. I did a right mastectomy and sn biopsy. She is doing well and will be discharged  Consults: None  Significant Diagnostic Studies: none  Treatments: surgery: right mastectomy, sn biopsy  Discharge Exam: Blood pressure 118/80, pulse 71, temperature (!) 97.1 F (36.2 C), resp. rate 20, height 5\' 4"  (1.626 m), weight 88.4 kg, SpO2 100 %. Incision/Wound:flaps viable, no hematoma, drain serosang  Disposition: Discharge disposition: 01-Home or Self Care        Allergies as of 10/23/2018   No Known Allergies     Medication List    STOP taking these medications   anastrozole 1 MG tablet Commonly known as: ARIMIDEX     TAKE these medications   oxyCODONE 5 MG immediate release tablet Commonly known as: Oxy IR/ROXICODONE Take 1-2 tablets (5-10 mg total) by mouth every 4 (four) hours as needed for moderate pain.      Follow-up Information    Follow up In 2 weeks.           Signed: Rolm Bookbinder 10/23/2018, 7:13 AM

## 2018-10-27 ENCOUNTER — Other Ambulatory Visit: Payer: Self-pay | Admitting: Oncology

## 2018-10-28 ENCOUNTER — Encounter: Payer: Self-pay | Admitting: *Deleted

## 2018-11-05 ENCOUNTER — Inpatient Hospital Stay: Payer: BC Managed Care – PPO | Attending: Oncology | Admitting: Oncology

## 2018-11-05 ENCOUNTER — Other Ambulatory Visit: Payer: Self-pay

## 2018-11-05 VITALS — BP 114/78 | HR 78 | Temp 98.0°F | Resp 18 | Ht 64.0 in | Wt 188.7 lb

## 2018-11-05 DIAGNOSIS — Z170 Estrogen receptor positive status [ER+]: Secondary | ICD-10-CM | POA: Insufficient documentation

## 2018-11-05 DIAGNOSIS — Z8041 Family history of malignant neoplasm of ovary: Secondary | ICD-10-CM | POA: Insufficient documentation

## 2018-11-05 DIAGNOSIS — Z79811 Long term (current) use of aromatase inhibitors: Secondary | ICD-10-CM | POA: Diagnosis not present

## 2018-11-05 DIAGNOSIS — Z9011 Acquired absence of right breast and nipple: Secondary | ICD-10-CM

## 2018-11-05 DIAGNOSIS — Z801 Family history of malignant neoplasm of trachea, bronchus and lung: Secondary | ICD-10-CM | POA: Diagnosis not present

## 2018-11-05 DIAGNOSIS — C50411 Malignant neoplasm of upper-outer quadrant of right female breast: Secondary | ICD-10-CM | POA: Diagnosis not present

## 2018-11-05 DIAGNOSIS — G935 Compression of brain: Secondary | ICD-10-CM | POA: Diagnosis not present

## 2018-11-05 MED ORDER — ANASTROZOLE 1 MG PO TABS: 1.0000 mg | ORAL_TABLET | Freq: Every day | ORAL | 4 refills | Status: DC

## 2018-11-05 NOTE — Progress Notes (Signed)
[place]  [phone]) [phone] [phone]     ID: Alice [name] Payment DOB: April 02, 1961  MR#: [id number]  ATF#:[id number]  Patient Care Team: Carlena Hurl, PA-C as PCP - General (Family Medicine) Mauro Kaufmann, RN as Oncology Nurse Navigator Rockwell Germany, RN as Oncology Nurse Navigator Magrinat, Virgie Dad, MD as Consulting Physician (Oncology) Kyung Rudd, MD as Consulting Physician (Radiation Oncology) Rolm Bookbinder, MD as Consulting Physician (General Surgery) Earnstine Regal, PA-C as Consulting Physician (Obstetrics and Gynecology) Juanita Craver, MD as Consulting Physician (Gastroenterology) [name], MD as Consulting Physician (Family Medicine) Cristine Polio, MD as Consulting Physician (Plastic Surgery) Chauncey Cruel, MD OTHER MD:   CHIEF COMPLAINT: Estrogen receptor positive breast cancer  CURRENT TREATMENT: Awaiting definitive surgery   INTERVAL HISTORY: [name] was seen today for follow up of her estrogen receptor positive breast cancer.  She interrupted anastrozole for her surgery, although this was not necessary.  She tolerated it well during the brief time that she took it, with no unusual side effects.  She proceeded to right mastectomy with sentinel lymph node biopsy on 10/22/2018 under Dr. Donne Hazel. Pathology from the procedure ([id number]) showed: invasive ductal carcinoma, grade 2, spanning 3.3 cm; perineural invasion identified; negative resection margins.  Of the three biopsied lymph nodes, none showed evidence of carcinoma (0/3).  She is scheduled to meet with Dr. Annabell Sabal at [facility] on 11/13/2018.   REVIEW OF SYSTEMS: Lakrisha reports surgery went well. She is not having any pain. She states she has changed her diet to improve her health.  She is also eating some special fruits that she thinks have anticancer properties.  She notes she has lost 12 lbs since beginning this diet. A detailed review of systems was  otherwise entirely negative.  Wt Readings from Last 3 Encounters:  11/05/18 188 lb 11.2 oz (85.6 kg)  10/22/18 194 lb 14.2 oz (88.4 kg)  09/30/18 196 lb 14.4 oz (89.3 kg)    HISTORY OF CURRENT ILLNESS: From the original intake note:  Alice [name] has a history of remote right breast cancer in 1997, treated with right lumpectomy and radiation in [facility].  The patient did not receive chemotherapy or antiestrogen treatments.  More recently she presented for her annual mammogram with a non-tender area of firmness in the right breast at 9 o'clock for 2 months. She underwent bilateral diagnostic mammography with tomography and right breast ultrasonography at [facility] on 09/08/2018 showing: breast density category A; scattered-appearing fibroglandular tissue at the lumpectomy site in the upper outer right breast middle depth spanning 3.4 cm, with indistinct margins, was not seen on prior mammogram from 2018.  There was also a keloid scar in the inferior medial left breast.  On physical exam there was a rectangular area of hardness measuring up to 7 cm in the right breast at 9:00, corresponding with the prior lumpectomy and consistent with radiation change.  Ultrasound of this area showed only postlumpectomy change, with no suspicious or discrete mass.  Ultrasound of the axilla was benign  Accordingly on 09/23/2018 she proceeded to biopsy of the right breast area in question. The pathology from this procedure SAA 20-07/02/2000 showed: invasive ductal carcinoma, E-cadherin positive,  grade 3,  Prognostic indicators significant for: estrogen receptor at 100% positive and progesterone receptor, 30% positive, both with strong staining intensity proliferation marker Ki67 at 70%. HER2 negative by immunohistochemistry (0).  The patient's subsequent history is as detailed below.   PAST MEDICAL HISTORY: Past Medical History:  Diagnosis Date  .  Anemia   . Breast cancer ([place])    right in 1997  .  Family history of lung cancer   . Family history of ovarian cancer   . GERD (gastroesophageal reflux disease) 06/30/2018  . Herpes simplex type 2 infection 02/2006  . History of breast cancer 1997  . History of esophagogastroduodenoscopy (EGD) 09/15/2015   small hiatal hernia was noted otherwise all was normal  . Personal history of breast cancer     PAST SURGICAL HISTORY: Past Surgical History:  Procedure Laterality Date  . BREAST ENHANCEMENT SURGERY     s/p cancer and surgery, right  . BREAST LUMPECTOMY     right due to breast cancer  . MASTECTOMY W/ SENTINEL NODE BIOPSY Right 10/22/2018   Procedure: RIGHT MASTECTOMY WITH RIGHT AXILLARY SENTINEL LYMPH NODE BIOPSY;  Surgeon: Rolm Bookbinder, MD;  Location: [place];  Service: General;  Laterality: Right;  wisdom teeth removal   FAMILY HISTORY: Family History  Problem Relation Age of Onset  . Ovarian cancer Sister 56  . Dementia Mother   . Aneurysm Father    Patient's father was 42 years old when he died from aneurysm. Patient's mother died from dementia at age 65. Her sister was diagnosed with ovarian cancer at age 75, and this subsequently took her life.  In total of the patient has 5 brothers and 2 sisters.  She is not aware of other cancers in the family  GYNECOLOGIC HISTORY:  No LMP recorded. Patient is postmenopausal. Menarche: 58 years old [place] P 0 LMP age 47 Contraceptive none HRT none  Hysterectomy? no BSO? 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 She is married. Husband Alice [name] is from [place], so his native language is [place]. She lives at home with her husband. They have no children. She attends a [employer] in [place].    ADVANCED DIRECTIVES: In the absence of documents to the contrary her husband is automatically her HCPOA.   HEALTH MAINTENANCE: Social History   Tobacco Use  . Smoking status: Never Smoker  .  Smokeless tobacco: Never Used  Substance Use Topics  . Alcohol use: No    Alcohol/week: 0.0 standard drinks  . Drug use: No     Colonoscopy: endoscopy in 2017, colonoscopy due 2020 but delayed due to virus  PAP: 12/03/2017, normal  Bone density: never done   No Known Allergies  Current Outpatient Medications  Medication Sig Dispense Refill  . anastrozole (ARIMIDEX) 1 MG tablet Take 1 tablet (1 mg total) by mouth daily. 90 tablet 4  . oxyCODONE (OXY IR/ROXICODONE) 5 MG immediate release tablet Take 1-2 tablets (5-10 mg total) by mouth every 4 (four) hours as needed for moderate pain. 10 tablet 0   No current facility-administered medications for this visit.     OBJECTIVE: Middle-aged African-American woman in no acute distress  Vitals:   11/05/18 1334  BP: 114/78  Pulse: 78  Resp: 18  Temp: 98 F (36.7 C)  SpO2: 100%     Body mass index is 32.39 kg/m.   Wt Readings from Last 3 Encounters:  11/05/18 188 lb 11.2 oz (85.6 kg)  10/22/18 194 lb 14.2 oz (88.4 kg)  09/30/18 196 lb 14.4 oz (89.3 kg)      ECOG FS:1 - Symptomatic but completely ambulatory  Sclerae unicteric, EOMs intact Wearing a mask No cervical or supraclavicular adenopathy Lungs no rales or rhonchi Heart regular rate  and rhythm Abd soft, nontender, positive bowel sounds MSK no focal spinal tenderness, no upper extremity lymphedema Neuro: nonfocal, well oriented, appropriate affect Breasts: The right breast is status post mastectomy.  Steri-Strips are still in place.  There is no evidence of dehiscence swelling or erythema.  The left breast is unremarkable.  Both axillae are benign.  LAB RESULTS:  CMP     Component Value Date/Time   NA 139 09/30/2018 0819   K 3.7 09/30/2018 0819   CL 107 09/30/2018 0819   CO2 22 09/30/2018 0819   GLUCOSE 90 09/30/2018 0819   BUN 5 (L) 09/30/2018 0819   CREATININE 0.73 09/30/2018 0819   CALCIUM 9.0 09/30/2018 0819   PROT 7.9 09/30/2018 0819   ALBUMIN 4.0  09/30/2018 0819   AST 20 09/30/2018 0819   ALT 6 09/30/2018 0819   ALKPHOS 126 09/30/2018 0819   BILITOT 0.5 09/30/2018 0819   GFRNONAA >60 09/30/2018 0819   GFRAA >60 09/30/2018 0819    No results found for: TOTALPROTELP, ALBUMINELP, A1GS, A2GS, BETS, BETA2SER, GAMS, MSPIKE, SPEI  No results found for: Nils Pyle, [facility]  Lab Results  Component Value Date   WBC 6.7 09/30/2018   NEUTROABS 4.2 09/30/2018   HGB 13.1 09/30/2018   HCT 39.6 09/30/2018   MCV 95.4 09/30/2018   PLT 353 09/30/2018    _0 @  No results found for: LABCA2  No components found for: UUVOZD664  No results for input(s): INR in the last 168 hours.  No results found for: LABCA2  No results found for: QIH474  No results found for: QVZ563  No results found for: OVF643  No results found for: CA2729  No components found for: HGQUANT  No results found for: CEA1 / No results found for: CEA1   No results found for: AFPTUMOR  No results found for: CHROMOGRNA  No results found for: PSA1  No visits with results within 3 Day(s) from this visit.  Latest known visit with results is:  Hospital Outpatient Visit on 10/19/2018  Component Date Value Ref Range Status  . SARS Coronavirus 2 10/19/2018 NEGATIVE  NEGATIVE Final   Comment: (NOTE) SARS-CoV-2 target nucleic acids are NOT DETECTED. The SARS-CoV-2 RNA is generally detectable in upper and lower respiratory specimens during the acute phase of infection. Negative results do not preclude SARS-CoV-2 infection, do not rule out co-infections with other pathogens, and should not be used as the sole basis for treatment or other patient management decisions. Negative results must be combined with clinical observations, patient history, and epidemiological information. The expected result is Negative. Fact Sheet for Patients: TrashEliminator.se Fact Sheet for Healthcare Providers:  WhoisBlogging.ch This test is not yet approved or cleared by the [place] FDA and  has been authorized for detection and/or diagnosis of SARS-CoV-2 by FDA under an Emergency Use Authorization (EUA). This EUA will remain  in effect (meaning this test can be used) for the duration of the COVID-19 declaration under Section 56                          4(b)(1) of the Act, 21 U.S.C. section 360bbb-3(b)(1), unless the authorization is terminated or revoked sooner. Performed at [facility], [place] [address], [place], [place] [address]     (this displays the last labs from the last 3 days)  No results found for: TOTALPROTELP, ALBUMINELP, A1GS, A2GS, BETS, BETA2SER, GAMS, MSPIKE, SPEI (this displays SPEP labs)  No results found for:  KPAFRELGTCHN, LAMBDASER, KAPLAMBRATIO (kappa/lambda light chains)  No results found for: HGBA, HGBA2QUANT, HGBFQUANT, HGBSQUAN (Hemoglobinopathy evaluation)   No results found for: LDH  No results found for: IRON, TIBC, IRONPCTSAT (Iron and TIBC)  No results found for: FERRITIN  Urinalysis No results found for: COLORURINE, APPEARANCEUR, LABSPEC, PHURINE, GLUCOSEU, HGBUR, BILIRUBINUR, KETONESUR, PROTEINUR, UROBILINOGEN, NITRITE, LEUKOCYTESUR   STUDIES: Nm Sentinel Node Inj-no Rpt (breast)  Result Date: 10/22/2018 Sulfur colloid was injected by the nuclear medicine technologist for melanoma sentinel node.    ELIGIBLE FOR AVAILABLE RESEARCH PROTOCOL: no  ASSESSMENT: 58 y.o. [place] woman   (1) status post right lumpectomy in 1997,  (a) status post adjuvant radiation  (b) did not receive antiestrogens or chemotherapy  (2) status post right breast upper outer quadrant biopsy 09/23/2018 for a clinical T2 N0, stage IIA invasive ductal carcinoma, grade 3, estrogen and progesterone receptor positive, HER-2 not amplified, with an MIB-1 of 70%  (3) Oncotype obtained from the initial biopsy showed a score of 26  predicting a risk of recurrence outside the breast of 16/21% (depending on nodal status), if the patient's only systemic therapy is an antiestrogen for 5 years.  It also predicts a significant benefit from chemotherapy  (4) status post right mastectomy and sentinel lymph node sampling 10/22/2018 for a pT2 pN0, stage IB invasive ductal carcinoma, grade 2, with negative margins  (a) not planning reconstruction at this point  (5) adjuvant chemotherapy recommended  (6) anastrozole started 09/30/2018  (7) genetics testing 10/06/2018 through the [facility] Common Hereditary Cancers Panel found no deleterious mutations in APC, ATM, AXIN2, BARD1, BMPR1A, BRCA1, BRCA2, BRIP1, CDH1, CDKN2A (p14ARF), CDKN2A (p16INK4a), CKD4, CHEK2, CTNNA1, DICER1, EPCAM (Deletion/duplication testing only), GREM1 (promoter region deletion/duplication testing only), KIT, MEN1, MLH1, MSH2, MSH3, MSH6, MUTYH, NBN, NF1, NHTL1, PALB2, PDGFRA, PMS2, POLD1, POLE, PTEN, RAD50, RAD51C, RAD51D, SDHB, SDHC, SDHD, SMAD4, SMARCA4. STK11, TP53, TSC1, TSC2, and VHL. The following genes were evaluated for sequence changes only: SDHA and HOXB13 c.251G>A variant only.   (a) 2 VUS's identified: VUS in APC c.-34014C>T and VHL c.340+272T>G identified   PLAN: I spent approximately 50 minutes today with Diane going over her situation in detail.  She has been doing quite a bit of reading and had questions regarding immunotherapy, the effect of diet, and holistic approaches.  We reviewed the fact that her Oncotype analysis her cancer is dangerous and will benefit from chemotherapy.  It predicts a 16% risk of this cancer recurring in her liver lungs bones or brain over the next 9 years if her only treatment is antiestrogens for 5 years.  If she does not take antiestrogens or chemotherapy the risk of recurrence would be much higher.  She feels chemotherapy is very damaging to multiple organ systems, and might possibly make her demented for example.  She is  concerned that if she takes anastrozole for a few years all her bones will break.  I worked quite hard today to make sure that Diane understood why we are recommending chemotherapy in her case.  She understands that we are not talking about her developing a new cancer in the future.  We are talking about this cancer that was removed recurring.  She understands the reason it might recur is because it could have spread to other parts of her body away of the blood, before the cancer in the breast was removed.  She also understands that if the cancer is present, it likely would be microscopic, since she has stage I disease, and  therefore CT scans bone scans and PET scans are not likely to discover it.  I am satisfied that she has a good understanding of the above.  She brings up the fact that her Oncotype score was just above the breakpoint and that if her score had been just one-point less we would not be discussing chemotherapy.  I agree with her in that regard.  Nevertheless I recommendation based on that Oncotype is that she do receive chemotherapy.  I have offered her Cytoxan/Taxotere, Cytoxan/Adriamycin followed by paclitaxel, or CMF chemotherapy.  She would like to be called by patients who are receiving these treatments to discuss how they are feeling.  I will try to arrange that for her.  She is seeking a second opinion at [facility] later this month.  She understands that she may also seek second opinions at [facility] or [facility] and that she can do this on her own so she does not feel that we are somehow influencing the oncologists she will be speaking with.  After all this what she wanted to do was return to see me on July 28 and give me her definitive decision at that point.  I have made that appointment.  I also refilled her anastrozole, which she apparently had run out of  Chauncey Cruel, MD   11/05/2018 4:37 PM Medical Oncology and Hematology [facility] [place],  [address] Tel. [phone]    Fax. [phone]   This document serves as a record of services personally performed by Lurline Del, MD. It was created on his behalf by Wilburn Mylar, a trained medical scribe. The creation of this record is based on the scribe's personal observations and the provider's statements to them.   I, Lurline Del MD, have reviewed the above documentation for accuracy and completeness, and I agree with the above.

## 2018-11-06 ENCOUNTER — Telehealth: Payer: Self-pay | Admitting: Oncology

## 2018-11-06 NOTE — Telephone Encounter (Signed)
I talk with patient regarding schedule  

## 2018-11-11 DIAGNOSIS — C50911 Malignant neoplasm of unspecified site of right female breast: Secondary | ICD-10-CM | POA: Diagnosis not present

## 2018-11-12 DIAGNOSIS — R634 Abnormal weight loss: Secondary | ICD-10-CM | POA: Diagnosis not present

## 2018-11-12 DIAGNOSIS — Z1211 Encounter for screening for malignant neoplasm of colon: Secondary | ICD-10-CM | POA: Diagnosis not present

## 2018-11-12 DIAGNOSIS — R1013 Epigastric pain: Secondary | ICD-10-CM | POA: Diagnosis not present

## 2018-11-12 DIAGNOSIS — Z853 Personal history of malignant neoplasm of breast: Secondary | ICD-10-CM | POA: Diagnosis not present

## 2018-11-13 DIAGNOSIS — D1771 Benign lipomatous neoplasm of kidney: Secondary | ICD-10-CM | POA: Diagnosis not present

## 2018-11-13 DIAGNOSIS — K56609 Unspecified intestinal obstruction, unspecified as to partial versus complete obstruction: Secondary | ICD-10-CM | POA: Diagnosis not present

## 2018-11-18 DIAGNOSIS — C50811 Malignant neoplasm of overlapping sites of right female breast: Secondary | ICD-10-CM | POA: Diagnosis not present

## 2018-11-18 DIAGNOSIS — Z170 Estrogen receptor positive status [ER+]: Secondary | ICD-10-CM | POA: Diagnosis not present

## 2018-11-18 DIAGNOSIS — C50411 Malignant neoplasm of upper-outer quadrant of right female breast: Secondary | ICD-10-CM | POA: Diagnosis not present

## 2018-11-19 DIAGNOSIS — R634 Abnormal weight loss: Secondary | ICD-10-CM | POA: Diagnosis not present

## 2018-11-19 DIAGNOSIS — R1013 Epigastric pain: Secondary | ICD-10-CM | POA: Diagnosis not present

## 2018-11-19 DIAGNOSIS — D123 Benign neoplasm of transverse colon: Secondary | ICD-10-CM | POA: Diagnosis not present

## 2018-11-19 DIAGNOSIS — Z1211 Encounter for screening for malignant neoplasm of colon: Secondary | ICD-10-CM | POA: Diagnosis not present

## 2018-11-23 ENCOUNTER — Ambulatory Visit: Payer: BC Managed Care – PPO | Admitting: Physical Therapy

## 2018-11-24 ENCOUNTER — Ambulatory Visit: Payer: BC Managed Care – PPO | Admitting: Oncology

## 2018-11-24 DIAGNOSIS — C50411 Malignant neoplasm of upper-outer quadrant of right female breast: Secondary | ICD-10-CM | POA: Diagnosis not present

## 2018-11-24 DIAGNOSIS — Z170 Estrogen receptor positive status [ER+]: Secondary | ICD-10-CM | POA: Diagnosis not present

## 2018-11-27 ENCOUNTER — Other Ambulatory Visit: Payer: Self-pay | Admitting: *Deleted

## 2018-11-27 ENCOUNTER — Inpatient Hospital Stay: Payer: BC Managed Care – PPO | Admitting: Adult Health

## 2018-11-27 ENCOUNTER — Telehealth: Payer: Self-pay | Admitting: Adult Health

## 2018-11-27 DIAGNOSIS — C50811 Malignant neoplasm of overlapping sites of right female breast: Secondary | ICD-10-CM | POA: Diagnosis not present

## 2018-11-27 MED ORDER — ANASTROZOLE 1 MG PO TABS: 1.0000 mg | ORAL_TABLET | Freq: Every day | ORAL | 4 refills | Status: DC

## 2018-11-27 NOTE — Telephone Encounter (Signed)
Called and LMOM to call back regarding missed appointment.  Wilber Bihari, NP

## 2018-11-30 ENCOUNTER — Ambulatory Visit: Payer: BC Managed Care – PPO | Attending: General Surgery | Admitting: Physical Therapy

## 2018-11-30 ENCOUNTER — Other Ambulatory Visit: Payer: Self-pay

## 2018-11-30 ENCOUNTER — Other Ambulatory Visit (HOSPITAL_BASED_OUTPATIENT_CLINIC_OR_DEPARTMENT_OTHER): Payer: BC Managed Care – PPO | Admitting: Adult Health

## 2018-11-30 ENCOUNTER — Encounter: Payer: Self-pay | Admitting: Physical Therapy

## 2018-11-30 DIAGNOSIS — Z170 Estrogen receptor positive status [ER+]: Secondary | ICD-10-CM

## 2018-11-30 DIAGNOSIS — M25611 Stiffness of right shoulder, not elsewhere classified: Secondary | ICD-10-CM

## 2018-11-30 DIAGNOSIS — Z483 Aftercare following surgery for neoplasm: Secondary | ICD-10-CM

## 2018-11-30 DIAGNOSIS — R293 Abnormal posture: Secondary | ICD-10-CM | POA: Diagnosis not present

## 2018-11-30 DIAGNOSIS — C50411 Malignant neoplasm of upper-outer quadrant of right female breast: Secondary | ICD-10-CM | POA: Diagnosis not present

## 2018-11-30 DIAGNOSIS — E2839 Other primary ovarian failure: Secondary | ICD-10-CM

## 2018-11-30 NOTE — Therapy (Signed)
[place], [place], [address] Phone: [phone]   Fax:  [phone]  Physical Therapy Evaluation  Patient Details  Name: Alice [name] MRN: [id number] Date of Birth: 23-Jun-1960 Referring Provider (PT): Dr. Rolm Bookbinder   Encounter Date: 11/30/2018  PT End of Session - 11/30/18 1542    Visit Number  2    Number of Visits  10    Date for PT Re-Evaluation  12/28/18    PT Start Time  9937    PT Stop Time  1400    PT Time Calculation (min)  55 min    Activity Tolerance  Patient tolerated treatment well    Behavior During Therapy  [facility] for tasks assessed/performed       Past Medical History:  Diagnosis Date  . Anemia   . Breast cancer ([place])    right in 1997  . Family history of lung cancer   . Family history of ovarian cancer   . GERD (gastroesophageal reflux disease) 06/30/2018  . Herpes simplex type 2 infection 02/2006  . History of breast cancer 1997  . History of esophagogastroduodenoscopy (EGD) 09/15/2015   small hiatal hernia was noted otherwise all was normal  . Personal history of breast cancer     Past Surgical History:  Procedure Laterality Date  . BREAST ENHANCEMENT SURGERY     s/p cancer and surgery, right  . BREAST LUMPECTOMY     right due to breast cancer  . MASTECTOMY W/ SENTINEL NODE BIOPSY Right 10/22/2018   Procedure: RIGHT MASTECTOMY WITH RIGHT AXILLARY SENTINEL LYMPH NODE BIOPSY;  Surgeon: Rolm Bookbinder, MD;  Location: [place];  Service: General;  Laterality: Right;    There were no vitals filed for this visit.   Subjective Assessment - 11/30/18 1315    Subjective  Patient underwent a right mastectomy and sentinel node biopsy (0/3 nodes positive) on 10/22/2018. She had an Oncotype score of 26 and is undecided about chemotherapy. She had a right lumpectomy and radiation in 1997.    Pertinent History  Patient was diagnosed on 09/08/2018  with right grade III invasive ductal carcinoma breast cancer. It measures 3.6 cm and is located in the upper outer quadrant. It is ER/PR positive and HER2 negative with a Ki67 of 70%. She has a history of a right breast cancer in 1997 when she had a lumpectomy and radiation. Patient underwent a right mastectomy and sentinel node biopsy (0/3 nodes positive) on 10/22/2018.    Patient Stated Goals  See if my arm is ok    Currently in Pain?  Yes    Pain Score  7     Pain Location  Axilla    Pain Orientation  Right    Pain Descriptors / Indicators  Aching    Pain Type  Surgical pain    Pain Onset  More than a month ago    Pain Frequency  Intermittent    Aggravating Factors   Nighttime    Pain Relieving Factors  Unknown    Multiple Pain Sites  No         [facility] PT Assessment - 11/30/18 0001      Assessment   Medical Diagnosis  s/p right mastectomy    Referring Provider (PT)  Dr. Rolm Bookbinder    Onset Date/Surgical Date  10/22/18    Hand Dominance  Right    Prior Therapy  Baselines      Precautions   Precautions  Other (comment)    Precaution Comments  recent surgery      Restrictions   Weight Bearing Restrictions  No      Balance Screen   Has the patient fallen in the past 6 months  No    Has the patient had a decrease in activity level because of a fear of falling?   No    Is the patient reluctant to leave their home because of a fear of falling?   No      Home Film/video editor residence    Living Arrangements  Spouse/significant other    Available Help at Discharge  Family      Prior Function   Level of Independence  Independent    Vocation  Full time employment    Vocation Requirements  Has not yet returned since surgery    Leisure  She is walking 2x/week for 40 minutes      Cognition   Overall Cognitive Status  Within Functional Limits for tasks assessed      Observation/Other Assessments   Observations  Incision appears well healed with  some scar tightness present which appears to be normal for 1 month post op      Posture/Postural Control   Posture/Postural Control  Postural limitations    Postural Limitations  Rounded Shoulders;Forward head      ROM / Strength   AROM / PROM / Strength  AROM      AROM   Overall AROM Comments  Unable to test IR/ER due to abduction limitations    AROM Assessment Site  Shoulder    Right/Left Shoulder  Right    Right Shoulder Extension  33 Degrees    Right Shoulder Flexion  78 Degrees    Right Shoulder ABduction  87 Degrees      Strength   Overall Strength  Unable to assess;Due to precautions;Due to pain        LYMPHEDEMA/ONCOLOGY QUESTIONNAIRE - 11/30/18 1334      Type   Cancer Type  Right breast      Surgeries   Mastectomy Date  10/22/18    Sentinel Lymph Node Biopsy Date  10/22/18    Number Lymph Nodes Removed  3      Treatment   Active Chemotherapy Treatment  No    Past Chemotherapy Treatment  No    Active Radiation Treatment  No    Past Radiation Treatment  Yes    Date  --   1997   Body Site  right breast    Current Hormone Treatment  No    Past Hormone Therapy  No      What other symptoms do you have   Are you Having Heaviness or Tightness  Yes    Are you having Pain  Yes    Are you having pitting edema  No    Is it Hard or Difficult finding clothes that fit  No    Do you have infections  No    Is there Decreased scar mobility  Yes    Stemmer Sign  No      Right Upper Extremity Lymphedema   10 cm Proximal to Olecranon Process  35.7 cm    Olecranon Process  23.8 cm    10 cm Proximal to Ulnar Styloid Process  20.1 cm    Just Proximal to Ulnar Styloid Process  14.3 cm    Across Hand at [employer]  18.6 cm  At [facility] of 2nd Digit  5.8 cm      Left Upper Extremity Lymphedema   10 cm Proximal to Olecranon Process  34.6 cm    Olecranon Process  24.4 cm    10 cm Proximal to Ulnar Styloid Process  19.1 cm    Just Proximal to Ulnar Styloid Process  14.1  cm    Across Hand at [employer]  18.4 cm    At [place] of 2nd Digit  5.7 cm          Quick Dash - 11/30/18 0001    Open a tight or new jar  Moderate difficulty    Do heavy household chores (wash walls, wash floors)  Unable    Carry a shopping bag or briefcase  Moderate difficulty    Wash your back  Unable    Use a knife to cut food  Moderate difficulty    Recreational activities in which you take some force or impact through your arm, shoulder, or hand (golf, hammering, tennis)  Severe difficulty    During the past week, to what extent has your arm, shoulder or hand problem interfered with your normal social activities with family, friends, neighbors, or groups?  Quite a bit    During the past week, to what extent has your arm, shoulder or hand problem limited your work or other regular daily activities  Quite a bit    Arm, shoulder, or hand pain.  Mild    Tingling (pins and needles) in your arm, shoulder, or hand  Moderate    Difficulty Sleeping  Moderate difficulty    DASH Score  63.64 %        Objective measurements completed on examination: See above findings.                   PT Long Term Goals - 11/30/18 1549      PT LONG TERM GOAL #1   Title  Patient will be able to demonstrate she has returned to baseline related to shoulder ROM and function.    Time  4    Period  Weeks    Status  On-going    Target Date  12/28/18      PT LONG TERM GOAL #2   Title  Patient will improve DASH score to </= 20 for improved overall right arm function.    Baseline  63.64    Time  4    Period  Weeks    Status  New    Target Date  12/28/18      PT LONG TERM GOAL #3   Title  Patient will increase right shoulder flexion to >/= 130 degrees for increased ease reaching.    Baseline  78 degrees    Time  4    Period  Weeks    Status  New    Target Date  12/28/18      PT LONG TERM GOAL #4   Title  Patient will increase right shoulder abduction to >/= 150 degrees for  increased ease reaching.    Baseline  87 degrees    Time  4    Period  Weeks    Status  New      PT LONG TERM GOAL #5   Title  Patient will demonstrate proper scar masssage technique for reducing tissue tightness.    Time  4    Period  Weeks    Status  New  Plan - 11/30/18 1542    Clinical Impression Statement  Patient arrived today wearing gloves and a mask. PT educated pt on how germs can be spread wearing gloves from one surface to another and that it is safer to clean hands frequently. She verbalized understanding. Patient is doing well s/p right mastectomy and sentinel node biopsy. She has very limited AROM in her right shoulder and poor function. This is limited by pain and fear of moving her arm. She had concerns today about feeling the hard place in her cehst but when she showed me what she was concerned about, I was able to reassure her that what I was feeling was her rib. I explained that she would be able to feel ribs on that side now as all breast tissue was removed. She had many concerns and questions about chemotherapy and whether to move forward with that. She was encouraged to talk with her oncologist about this. She appeared very anxious and nervous through most of today's visit but seemed to relax a bit after therapist worked to reassure her that she was healing normally. She will benefit from PT to regain shoulder ROM and fucntion and decrease scar tissue tightness.    Rehab Potential  Excellent    PT Frequency  2x / week    PT Duration  4 weeks    PT Treatment/Interventions  ADLs/Self Care Home Management;Patient/family education;Therapeutic exercise;Manual techniques;Manual lymph drainage;Scar mobilization    PT Next Visit Plan  Begin PROM bil shoulders; pulleys, genle ROM exercises    PT Home Exercise Plan  Post op shoulder ROM HEP    Consulted and Agree with Plan of Care  Patient       Patient will benefit from skilled therapeutic intervention in  order to improve the following deficits and impairments:  Decreased range of motion, Increased fascial restricitons, Impaired UE functional use, Pain, Postural dysfunction, Decreased knowledge of precautions, Decreased scar mobility  Visit Diagnosis: 1. Malignant neoplasm of upper-outer quadrant of right breast in female, estrogen receptor positive ([place])   2. Abnormal posture   3. Aftercare following surgery for neoplasm   4. Stiffness of right shoulder, not elsewhere classified        Problem List Patient Active Problem List   Diagnosis Date Noted  . Genetic testing 10/06/2018  . Family history of ovarian cancer   . Family history of lung cancer   . Personal history of breast cancer   . Malignant neoplasm of upper-outer quadrant of right breast in female, estrogen receptor positive ([place]) 09/29/2018   Annia Friendly, PT 11/30/18 3:55 PM  [place], [place], [address] Phone: [phone]   Fax:  [phone]  Name: Alice [name] MRN: [id number] Date of Birth: 1960-07-20

## 2018-11-30 NOTE — Progress Notes (Signed)
Talked with patient about her treatment options and about her considerations with whether or not to receive chemotherapy.  She is not taking the Anastrozole because she is concerned about bone loss and would like a bone density set up.  I placed orders for this today.  I asked her if she had made a decision about chemotherapy, and she said that she will let us know on 12/10/2018.  She is going to get a couple of other second opinions.  She does not want a port placed however, she wants a peripheral IV as if she gets chemotherapy she wants the Docetaxel and cyclophosphamide given every 21 days x 4 cycles.    We will see her back on 12/21/2018 for labs, f/u, and tentative chemotherapy.  I will have our navigators reach out to her on 8/13 to determine whether or not we need to set her up for chemo education.    Time spent: 20 minutes  Wilber Bihari, NP

## 2018-12-01 ENCOUNTER — Other Ambulatory Visit: Payer: Self-pay | Admitting: Oncology

## 2018-12-02 ENCOUNTER — Telehealth: Payer: Self-pay | Admitting: Adult Health

## 2018-12-02 NOTE — Telephone Encounter (Signed)
I talk with patient regarding schedule  

## 2018-12-03 ENCOUNTER — Ambulatory Visit: Payer: BC Managed Care – PPO | Admitting: Physical Therapy

## 2018-12-03 DIAGNOSIS — C50911 Malignant neoplasm of unspecified site of right female breast: Secondary | ICD-10-CM | POA: Diagnosis not present

## 2018-12-07 ENCOUNTER — Telehealth: Payer: Self-pay | Admitting: *Deleted

## 2018-12-07 NOTE — Telephone Encounter (Signed)
Received call from pt wanting office fax number to send FMLA paperwork.  Our direct fax number was given to pt ([phone]).  Pt appreciative of help.

## 2018-12-07 NOTE — Telephone Encounter (Signed)
Records faxed to [place] [phone]

## 2018-12-09 DIAGNOSIS — R070 Pain in throat: Secondary | ICD-10-CM | POA: Diagnosis not present

## 2018-12-10 ENCOUNTER — Ambulatory Visit: Payer: BC Managed Care – PPO | Admitting: Physical Therapy

## 2018-12-10 DIAGNOSIS — M810 Age-related osteoporosis without current pathological fracture: Secondary | ICD-10-CM | POA: Diagnosis not present

## 2018-12-10 DIAGNOSIS — Z853 Personal history of malignant neoplasm of breast: Secondary | ICD-10-CM | POA: Diagnosis not present

## 2018-12-10 DIAGNOSIS — Z780 Asymptomatic menopausal state: Secondary | ICD-10-CM | POA: Diagnosis not present

## 2018-12-11 ENCOUNTER — Telehealth: Payer: Self-pay | Admitting: Adult Health

## 2018-12-11 DIAGNOSIS — D126 Benign neoplasm of colon, unspecified: Secondary | ICD-10-CM | POA: Diagnosis not present

## 2018-12-11 DIAGNOSIS — K5904 Chronic idiopathic constipation: Secondary | ICD-10-CM | POA: Diagnosis not present

## 2018-12-11 DIAGNOSIS — R1013 Epigastric pain: Secondary | ICD-10-CM | POA: Diagnosis not present

## 2018-12-11 NOTE — Telephone Encounter (Signed)
Left voicemail to confirm appt and verify info. °

## 2018-12-14 ENCOUNTER — Inpatient Hospital Stay: Payer: BC Managed Care – PPO | Attending: Oncology | Admitting: Adult Health

## 2018-12-14 ENCOUNTER — Encounter: Payer: Self-pay | Admitting: Adult Health

## 2018-12-14 DIAGNOSIS — C50411 Malignant neoplasm of upper-outer quadrant of right female breast: Secondary | ICD-10-CM | POA: Insufficient documentation

## 2018-12-14 DIAGNOSIS — Z79899 Other long term (current) drug therapy: Secondary | ICD-10-CM | POA: Insufficient documentation

## 2018-12-14 DIAGNOSIS — Z170 Estrogen receptor positive status [ER+]: Secondary | ICD-10-CM | POA: Diagnosis not present

## 2018-12-14 DIAGNOSIS — Z9011 Acquired absence of right breast and nipple: Secondary | ICD-10-CM | POA: Diagnosis not present

## 2018-12-14 DIAGNOSIS — Z5111 Encounter for antineoplastic chemotherapy: Secondary | ICD-10-CM | POA: Insufficient documentation

## 2018-12-14 DIAGNOSIS — Z5189 Encounter for other specified aftercare: Secondary | ICD-10-CM | POA: Insufficient documentation

## 2018-12-14 NOTE — Progress Notes (Signed)
[place]  [phone] Fax:([phone]     ID: [name] DOB: 09/23/1960  MR#: [id number]  TDV#:[id number]  Patient Care Team: Carlena Hurl, PA-C as PCP - General (Family Medicine) Mauro Kaufmann, RN as Oncology Nurse Navigator Rockwell Germany, RN as Oncology Nurse Navigator Magrinat, Virgie Dad, MD as Consulting Physician (Oncology) Kyung Rudd, MD as Consulting Physician (Radiation Oncology) Rolm Bookbinder, MD as Consulting Physician (General Surgery) Earnstine Regal, PA-C as Consulting Physician (Obstetrics and Gynecology) Juanita Craver, MD as Consulting Physician (Gastroenterology) [name], MD as Consulting Physician (Family Medicine) Cristine Polio, MD as Consulting Physician (Plastic Surgery) Scot Dock, NP OTHER MD:  I connected with [name] on 12/14/18 at 11:30 AM EDT by telephone and verified that I am speaking with the correct person using two identifiers. I discussed the limitations, risks, security and privacy concerns of performing an evaluation and management service by telephone and the availability of in person appointments. I also discussed with the patient that there may be a patient responsible charge related to this service. The patient expressed understanding and agreed to proceed.    CHIEF COMPLAINT: Estrogen receptor positive breast cancer  CURRENT TREATMENT: s/p surgery.  To start adjuvant chemotherapy   INTERVAL HISTORY: Moana was seen today for follow up of her estrogen receptor positive breast cancer.  [place] and I spoke last on 11/30/2018.  She did not want to take Anastrozole due to fear of bone loss and wanted a bone density test done.  She underwent this on 12/10/2018 and we are awaiting results.  She and I reviewed her chemotherapy options again that she spoke with Dr. Jana Hakim about.  She has spent a lot of time thinking about it and she would like to start with Docetaxel  and Cyclophosphamide.  She wants more information about the side effects that she might experience.    REVIEW OF SYSTEMS: Angeliki is doing well otherwise and a detailed ROS was non contributory today.   HISTORY OF CURRENT ILLNESS: From the original intake note:  Raelee Rossmann has a history of remote right breast cancer in 1997, treated with right lumpectomy and radiation in [place].  The patient did not receive chemotherapy or antiestrogen treatments.  More recently she presented for her annual mammogram with a non-tender area of firmness in the right breast at 9 o'clock for 2 months. She underwent bilateral diagnostic mammography with tomography and right breast ultrasonography at [facility] on 09/08/2018 showing: breast density category A; scattered-appearing fibroglandular tissue at the lumpectomy site in the upper outer right breast middle depth spanning 3.4 cm, with indistinct margins, was not seen on prior mammogram from 2018.  There was also a keloid scar in the inferior medial left breast.  On physical exam there was a rectangular area of hardness measuring up to 7 cm in the right breast at 9:00, corresponding with the prior lumpectomy and consistent with radiation change.  Ultrasound of this area showed only postlumpectomy change, with no suspicious or discrete mass.  Ultrasound of the axilla was benign  Accordingly on 09/23/2018 she proceeded to biopsy of the right breast area in question. The pathology from this procedure S[facility] 20-07/02/2000 showed: invasive ductal carcinoma, E-cadherin positive,  grade 3,  Prognostic indicators significant for: estrogen receptor at 100% positive and progesterone receptor, 30% positive, both with strong staining intensity proliferation marker Ki67 at 70%. HER2 negative by immunohistochemistry (0).  The patient's subsequent history is as detailed below.   PAST MEDICAL HISTORY:  Past Medical History:  Diagnosis Date  . Anemia   . Breast cancer ([place])     right in 1997  . Family history of lung cancer   . Family history of ovarian cancer   . GERD (gastroesophageal reflux disease) 06/30/2018  . Herpes simplex type 2 infection 02/2006  . History of breast cancer 1997  . History of esophagogastroduodenoscopy (EGD) 09/15/2015   small hiatal hernia was noted otherwise all was normal  . Personal history of breast cancer     PAST SURGICAL HISTORY: Past Surgical History:  Procedure Laterality Date  . BREAST ENHANCEMENT SURGERY     s/p cancer and surgery, right  . BREAST LUMPECTOMY     right due to breast cancer  . MASTECTOMY W/ SENTINEL NODE BIOPSY Right 10/22/2018   Procedure: RIGHT MASTECTOMY WITH RIGHT AXILLARY SENTINEL LYMPH NODE BIOPSY;  Surgeon: Rolm Bookbinder, MD;  Location: [place];  Service: General;  Laterality: Right;  wisdom teeth removal   FAMILY HISTORY: Family History  Problem Relation Age of Onset  . Ovarian cancer Sister 47  . Dementia Mother   . Aneurysm Father    Patient's father was 54 years old when he died from aneurysm. Patient's mother died from dementia at age 36. Her sister was diagnosed with ovarian cancer at age 40, and this subsequently took her life.  In total of the patient has 5 brothers and 2 sisters.  She is not aware of other cancers in the family  GYNECOLOGIC HISTORY:  No LMP recorded. Patient is postmenopausal. Menarche: 58 years old [place] P 0 LMP age 58 Contraceptive none HRT none  Hysterectomy? no BSO? 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 She is married. Husband Edmonia Caprio is from [place], so his native language is [place]. She lives at home with her husband. They have no children. She attends a [employer] in [place].    ADVANCED DIRECTIVES: In the absence of documents to the contrary her husband is automatically her HCPOA.   HEALTH MAINTENANCE: Social History   Tobacco Use  . Smoking  status: Never Smoker  . Smokeless tobacco: Never Used  Substance Use Topics  . Alcohol use: No    Alcohol/week: 0.0 standard drinks  . Drug use: No     Colonoscopy: endoscopy in 2017, colonoscopy due 2020 but delayed due to virus  PAP: 12/03/2017, normal  Bone density: never done   No Known Allergies  Current Outpatient Medications  Medication Sig Dispense Refill  . anastrozole (ARIMIDEX) 1 MG tablet Take 1 tablet (1 mg total) by mouth daily. (Patient not taking: Reported on 11/30/2018) 90 tablet 4  . oxyCODONE (OXY IR/ROXICODONE) 5 MG immediate release tablet Take 1-2 tablets (5-10 mg total) by mouth every 4 (four) hours as needed for moderate pain. (Patient not taking: Reported on 11/30/2018) 10 tablet 0   No current facility-administered medications for this visit.     OBJECTIVE:    LAB RESULTS:  CMP     Component Value Date/Time   NA 139 09/30/2018 0819   K 3.7 09/30/2018 0819   CL 107 09/30/2018 0819   CO2 22 09/30/2018 0819   GLUCOSE 90 09/30/2018 0819   BUN 5 (L) 09/30/2018 0819   CREATININE 0.73 09/30/2018 0819   CALCIUM 9.0 09/30/2018 0819   PROT 7.9 09/30/2018 0819   ALBUMIN 4.0 09/30/2018 0819   AST 20 09/30/2018 0819   ALT 6 09/30/2018  0819   ALKPHOS 126 09/30/2018 0819   BILITOT 0.5 09/30/2018 0819   GFRNONAA >60 09/30/2018 0819   GFRAA >60 09/30/2018 0819    No results found for: TOTALPROTELP, ALBUMINELP, A1GS, A2GS, BETS, BETA2SER, GAMS, MSPIKE, SPEI  No results found for: Nils Pyle, [facility]  Lab Results  Component Value Date   WBC 6.7 09/30/2018   NEUTROABS 4.2 09/30/2018   HGB 13.1 09/30/2018   HCT 39.6 09/30/2018   MCV 95.4 09/30/2018   PLT 353 09/30/2018    _0 @  No results found for: LABCA2  No components found for: LEXNTZ001  No results for input(s): INR in the last 168 hours.  No results found for: LABCA2  No results found for: VCB449  No results found for: QPR916  No results found for: BWG665   No results found for: CA2729  No components found for: HGQUANT  No results found for: CEA1 / No results found for: CEA1   No results found for: AFPTUMOR  No results found for: CHROMOGRNA  No results found for: PSA1  No visits with results within 3 Day(s) from this visit.  Latest known visit with results is:  Hospital Outpatient Visit on 10/19/2018  Component Date Value Ref Range Status  . SARS Coronavirus 2 10/19/2018 NEGATIVE  NEGATIVE Final   Comment: (NOTE) SARS-CoV-2 target nucleic acids are NOT DETECTED. The SARS-CoV-2 RNA is generally detectable in upper and lower respiratory specimens during the acute phase of infection. Negative results do not preclude SARS-CoV-2 infection, do not rule out co-infections with other pathogens, and should not be used as the sole basis for treatment or other patient management decisions. Negative results must be combined with clinical observations, patient history, and epidemiological information. The expected result is Negative. Fact Sheet for Patients: TrashEliminator.se Fact Sheet for Healthcare Providers: WhoisBlogging.ch This test is not yet approved or cleared by the [place] FDA and  has been authorized for detection and/or diagnosis of SARS-CoV-2 by FDA under an Emergency Use Authorization (EUA). This EUA will remain  in effect (meaning this test can be used) for the duration of the COVID-19 declaration under Section 56                          4(b)(1) of the Act, 21 U.S.C. section 360bbb-3(b)(1), unless the authorization is terminated or revoked sooner. Performed at [facility], [place] [address] [place], [place] [address]     (this displays the last labs from the last 3 days)  No results found for: TOTALPROTELP, ALBUMINELP, A1GS, A2GS, BETS, BETA2SER, GAMS, MSPIKE, SPEI (this displays SPEP labs)  No results found for: KPAFRELGTCHN, LAMBDASER, KAPLAMBRATIO  (kappa/lambda light chains)  No results found for: HGBA, HGBA2QUANT, HGBFQUANT, HGBSQUAN (Hemoglobinopathy evaluation)   No results found for: LDH  No results found for: IRON, TIBC, IRONPCTSAT (Iron and TIBC)  No results found for: FERRITIN  Urinalysis No results found for: COLORURINE, APPEARANCEUR, LABSPEC, PHURINE, GLUCOSEU, HGBUR, BILIRUBINUR, KETONESUR, PROTEINUR, UROBILINOGEN, NITRITE, LEUKOCYTESUR   STUDIES: No results found.  ELIGIBLE FOR AVAILABLE RESEARCH PROTOCOL: no  ASSESSMENT: 58 y.o.  woman   (1) status post right lumpectomy in 1997,  (a) status post adjuvant radiation  (b) did not receive antiestrogens or chemotherapy  (2) status post right breast upper outer quadrant biopsy 09/23/2018 for a clinical T2 N0, stage IIA invasive ductal carcinoma, grade 3, estrogen and progesterone receptor positive, HER-2 not amplified, with an MIB-1 of 70%  (3) Oncotype obtained from  the initial biopsy showed a score of 26 predicting a risk of recurrence outside the breast of 16/21% (depending on nodal status), if the patient's only systemic therapy is an antiestrogen for 5 years.  It also predicts a significant benefit from chemotherapy  (4) status post right mastectomy and sentinel lymph node sampling 10/22/2018 for a pT2 pN0, stage IB invasive ductal carcinoma, grade 2, with negative margins  (a) not planning reconstruction at this point  (5) adjuvant chemotherapy recommended on 11/05/2018 by Dr. Jana Hakim  (a) second opinion with Dr. Annabell Sabal at [facility] on 11/18/2018 recommended 4 cycles of adjuvant docetaxel and cyclophosphamide   (b) Third opinion with Dr. Adan Sis on 11/24/2018 recommended 4 cycles of adjuvant Docetaxel and Cyclophosphamide  (c) To start adjuvant chemotherapy with four cycles of Docetaxel and Cyclophosphamide on 12/23/2018  (6) anastrozole started 09/30/2018 (patient stopped due to concerns over bone loss--to restart once adjuvant therapy has completed)   (a) Bone density on 12/10/2018 shows a T score of -3.8 in the AP spine.    (7) genetics testing 10/06/2018 through the [facility] Common Hereditary Cancers Panel found no deleterious mutations in APC, ATM, AXIN2, BARD1, BMPR1A, BRCA1, BRCA2, BRIP1, CDH1, CDKN2A (p14ARF), CDKN2A (p16INK4a), CKD4, CHEK2, CTNNA1, DICER1, EPCAM (Deletion/duplication testing only), GREM1 (promoter region deletion/duplication testing only), KIT, MEN1, MLH1, MSH2, MSH3, MSH6, MUTYH, NBN, NF1, NHTL1, PALB2, PDGFRA, PMS2, POLD1, POLE, PTEN, RAD50, RAD51C, RAD51D, SDHB, SDHC, SDHD, SMAD4, SMARCA4. STK11, TP53, TSC1, TSC2, and VHL. The following genes were evaluated for sequence changes only: SDHA and HOXB13 c.251G>A variant only.   (a) 2 VUS's identified: VUS in APC c.-34014C>T and VHL c.340+272T>G identified   PLAN: Zakya and I spent 30 minutes today discussing whether or not she wants to proceed with chemotherapy.  After much thought, and consideration, she would like to proceed with adjuvant chemotherapy with Docetaxel and Cyclophosphamide.  She and I spent time discussing what the common side effects are including mucositis, cytopenias, possible irreversible hair loss, peripheral neuropathy, skin changes in the hands and feet, fatigue, nausea, vomiting, diarrhea.    We talked about the cold caps, and she is not interested at this point in them.    Ravleen will need chemo education and I will set this up for her.  She also will need to get her anti emetics and we will send these in for her.  I assured her that I will see her next week and review her medications and how to take them in detail.    Jordon does not want a port for chemotherapy.  She wants to get a peripheral IV.    Follow up instructions:    -Return to cancer center 8/26 for labs, f/u with LC, Chemotherapy  -She was recommended to continue with the appropriate pandemic precautions.     The patient was provided an opportunity to ask questions and all were  answered. The patient agreed with the plan and demonstrated an understanding of the instructions.   The patient was advised to call back or seek an in-person evaluation if the symptoms worsen or if the condition fails to improve as anticipated.   I provided 30 minutes of non face-to-face telephone visit time during this encounter, and > 50% was spent counseling as documented under my assessment & plan.   Scot Dock, NP   12/14/2018 10:07 AM Medical Oncology and Hematology [facility] [address] [place], [place] [address] Tel. [phone]    Fax. [phone]

## 2018-12-14 NOTE — Progress Notes (Signed)
START ON PATHWAY REGIMEN - Breast     A cycle is every 21 days:     Docetaxel      Cyclophosphamide   **Always confirm dose/schedule in your pharmacy ordering system**  Patient Characteristics: Postoperative without Neoadjuvant Therapy (Pathologic Staging), Invasive Disease, Adjuvant Therapy, HER2 Negative/Unknown/Equivocal, ER Positive, Node Negative, pT1a-c, pN0/N75m or pT2 or Higher, pN0, Oncotype High Risk (? 26) Therapeutic Status: Postoperative without Neoadjuvant Therapy (Pathologic Staging) AJCC Grade: G3 AJCC N Category: pN0 AJCC M Category: cM0 ER Status: Positive (+) AJCC 8 Stage Grouping: IB HER2 Status: Negative (-) Oncotype Dx Recurrence Score: 26 AJCC T Category: pT2 PR Status: Positive (+) Has this patient completed genomic testing<= Yes - Oncotype DX(R) Intent of Therapy: Curative Intent, Discussed with Patient

## 2018-12-15 ENCOUNTER — Ambulatory Visit: Payer: BC Managed Care – PPO | Admitting: Physical Therapy

## 2018-12-15 ENCOUNTER — Telehealth: Payer: Self-pay | Admitting: Adult Health

## 2018-12-15 NOTE — Telephone Encounter (Signed)
Called patient regarding upcoming scheduled appointments per 08/17 los, patient is notified.

## 2018-12-16 ENCOUNTER — Other Ambulatory Visit: Payer: Self-pay | Admitting: Oncology

## 2018-12-16 ENCOUNTER — Other Ambulatory Visit: Payer: Self-pay | Admitting: Adult Health

## 2018-12-16 DIAGNOSIS — C50411 Malignant neoplasm of upper-outer quadrant of right female breast: Secondary | ICD-10-CM

## 2018-12-16 DIAGNOSIS — Z170 Estrogen receptor positive status [ER+]: Secondary | ICD-10-CM

## 2018-12-16 MED ORDER — LIDOCAINE-PRILOCAINE 2.5-2.5 % EX CREA: TOPICAL_CREAM | CUTANEOUS | 3 refills | Status: DC

## 2018-12-16 MED ORDER — LORAZEPAM 0.5 MG PO TABS: 0.5000 mg | ORAL_TABLET | Freq: Every evening | ORAL | 0 refills | Status: DC | PRN

## 2018-12-16 MED ORDER — DEXAMETHASONE 4 MG PO TABS: 8.0000 mg | ORAL_TABLET | Freq: Two times a day (BID) | ORAL | 1 refills | Status: DC

## 2018-12-16 MED ORDER — PROCHLORPERAZINE MALEATE 10 MG PO TABS: 10.0000 mg | ORAL_TABLET | Freq: Four times a day (QID) | ORAL | 1 refills | Status: DC | PRN

## 2018-12-17 ENCOUNTER — Inpatient Hospital Stay: Payer: BC Managed Care – PPO

## 2018-12-17 ENCOUNTER — Other Ambulatory Visit: Payer: Self-pay | Admitting: Adult Health

## 2018-12-17 ENCOUNTER — Telehealth: Payer: Self-pay

## 2018-12-17 ENCOUNTER — Ambulatory Visit: Payer: BC Managed Care – PPO

## 2018-12-17 ENCOUNTER — Other Ambulatory Visit: Payer: Self-pay

## 2018-12-17 NOTE — Telephone Encounter (Signed)
Called patient in regards to her missing her second appointment now with physical therapy. She said she wasn't coming as she had a chemo class at noon, although her appointment with [place] was scheduled from 1000-1045 which would have given her ample time to get to [facility] for her class. Asked patient to please call us ahead of time when she decides she's not coming as we are able to schedule other patients in that slot. She verbalized understanding and plans to come to Mondays appointment.

## 2018-12-18 ENCOUNTER — Encounter: Payer: Self-pay | Admitting: *Deleted

## 2018-12-21 ENCOUNTER — Ambulatory Visit: Payer: BC Managed Care – PPO

## 2018-12-21 ENCOUNTER — Other Ambulatory Visit: Payer: Self-pay

## 2018-12-21 DIAGNOSIS — C50411 Malignant neoplasm of upper-outer quadrant of right female breast: Secondary | ICD-10-CM

## 2018-12-21 DIAGNOSIS — M25611 Stiffness of right shoulder, not elsewhere classified: Secondary | ICD-10-CM | POA: Diagnosis not present

## 2018-12-21 DIAGNOSIS — Z483 Aftercare following surgery for neoplasm: Secondary | ICD-10-CM | POA: Diagnosis not present

## 2018-12-21 DIAGNOSIS — Z170 Estrogen receptor positive status [ER+]: Secondary | ICD-10-CM | POA: Diagnosis not present

## 2018-12-21 DIAGNOSIS — R293 Abnormal posture: Secondary | ICD-10-CM | POA: Diagnosis not present

## 2018-12-21 NOTE — Therapy (Addendum)
[place], [place], [address] Phone: [phone]   Fax:  [phone]  Physical Therapy Treatment  Patient Details  Name: Alice [name] MRN: [id number] Date of Birth: Sep 12, 1960 Referring Provider (PT): Dr. Rolm Bookbinder   Encounter Date: 12/21/2018  PT End of Session - 12/21/18 1209    Visit Number  3    Number of Visits  10    Date for PT Re-Evaluation  12/28/18    PT Start Time  1109    PT Stop Time  1205    PT Time Calculation (min)  56 min    Activity Tolerance  Patient tolerated treatment well    Behavior During Therapy  [facility] for tasks assessed/performed       Past Medical History:  Diagnosis Date  . Anemia   . Breast cancer ([place])    right in 1997  . Family history of lung cancer   . Family history of ovarian cancer   . GERD (gastroesophageal reflux disease) 06/30/2018  . Herpes simplex type 2 infection 02/2006  . History of breast cancer 1997  . History of esophagogastroduodenoscopy (EGD) 09/15/2015   small hiatal hernia was noted otherwise all was normal  . Personal history of breast cancer     Past Surgical History:  Procedure Laterality Date  . BREAST ENHANCEMENT SURGERY     s/p cancer and surgery, right  . BREAST LUMPECTOMY     right due to breast cancer  . MASTECTOMY W/ SENTINEL NODE BIOPSY Right 10/22/2018   Procedure: RIGHT MASTECTOMY WITH RIGHT AXILLARY SENTINEL LYMPH NODE BIOPSY;  Surgeon: Rolm Bookbinder, MD;  Location: [place];  Service: General;  Laterality: Right;    There were no vitals filed for this visit.  Subjective Assessment - 12/21/18 1111    Subjective  I start my chemotherapy Wednesday. I've been doing the exercises some.    Pertinent History  Patient was diagnosed on 09/08/2018 with right grade III invasive ductal carcinoma breast cancer. It measures 3.6 cm and is located in the upper outer quadrant. It is ER/PR positive and  HER2 negative with a Ki67 of 70%. She has a history of a right breast cancer in 1997 when she had a lumpectomy and radiation. Patient underwent a right mastectomy and sentinel node biopsy (0/3 nodes positive) on 10/22/2018.    Patient Stated Goals  See if my arm is ok    Currently in Pain?  No/denies         [facility] PT Assessment - 12/21/18 0001      AROM   Right Shoulder Flexion  106 Degrees    Right Shoulder ABduction  90 Degrees                   [facility] Adult PT Treatment/Exercise - 12/21/18 0001      Manual Therapy   Manual Therapy  Myofascial release;Passive ROM    Myofascial Release  To Rt axillae during P/ROM    Passive ROM  In Supine to Rt shoulders into flexion, abduction and er to tolerance with max VCs throughout to relax                  PT Long Term Goals - 11/30/18 1549      PT LONG TERM GOAL #1   Title  Patient will be able to demonstrate she has returned to baseline related to shoulder ROM and function.    Time  4  Period  Weeks    Status  On-going    Target Date  12/28/18      PT LONG TERM GOAL #2   Title  Patient will improve DASH score to </= 20 for improved overall right arm function.    Baseline  63.64    Time  4    Period  Weeks    Status  New    Target Date  12/28/18      PT LONG TERM GOAL #3   Title  Patient will increase right shoulder flexion to >/= 130 degrees for increased ease reaching.    Baseline  78 degrees    Time  4    Period  Weeks    Status  New    Target Date  12/28/18      PT LONG TERM GOAL #4   Title  Patient will increase right shoulder abduction to >/= 150 degrees for increased ease reaching.    Baseline  87 degrees    Time  4    Period  Weeks    Status  New      PT LONG TERM GOAL #5   Title  Patient will demonstrate proper scar masssage technique for reducing tissue tightness.    Time  4    Period  Weeks    Status  New            Plan - 12/21/18 1210    Clinical Impression Statement  Pt  comes back today for first visit since evaluation. She starts chemo Wed and has an appt with Dr. Donne Hazel for check up [place]. She feels much better since surgery and tolerated stetching very well today overall, just needed VCs throughout to relax Rt shoulder as pt isstil very guarded. Discussed this with her throughout session, about importance of erect posture now that she is well healed from surgery. Also using Rt UE more with ADLs and "sprinkling" exerciess into her daily routine (i.e. reaching to higher shelf for strtch and holding, sliding arm up doorway after each bathroom use). Pt verbalized understanding all and was grateful fpor instruction and stretching today, reporting feeling better at end of session.    Personal Factors and Comorbidities  Comorbidity 1    Comorbidities  Previous right breast cancer    Stability/Clinical Decision Making  Stable/Uncomplicated    Rehab Potential  Excellent    PT Frequency  2x / week    PT Duration  4 weeks    PT Treatment/Interventions  ADLs/Self Care Home Management;Patient/family education;Therapeutic exercise;Manual techniques;Manual lymph drainage;Scar mobilization    PT Next Visit Plan  Instruct in supine dowel exercises; Cont PROM bil shoulders; pulleys, genle ROM exercises    PT Home Exercise Plan  Post op shoulder ROM HEP    Consulted and Agree with Plan of Care  Patient       Patient will benefit from skilled therapeutic intervention in order to improve the following deficits and impairments:  Decreased range of motion, Increased fascial restricitons, Impaired UE functional use, Pain, Postural dysfunction, Decreased knowledge of precautions, Decreased scar mobility  Visit Diagnosis: Malignant neoplasm of upper-outer quadrant of right breast in female, estrogen receptor positive ([place])  Abnormal posture  Aftercare following surgery for neoplasm  Stiffness of right shoulder, not elsewhere classified     Problem List Patient Active  Problem List   Diagnosis Date Noted  . Genetic testing 10/06/2018  . Family history of ovarian cancer   . Family history of lung cancer   .  Personal history of breast cancer   . Malignant neoplasm of upper-outer quadrant of right breast in female, estrogen receptor positive ([place]) 09/29/2018   PHYSICAL THERAPY DISCHARGE SUMMARY  Visits from Start of Care: 3  Current functional level related to goals / functional outcomes: Patient did not return after 3 visits for unknown reasons. Unable to know current functional level.   Remaining deficits: Unknown   Education / Equipment: Lymphedema risk reduction and HEP  Plan: Patient agrees to discharge.  Patient goals were not met. Patient is being discharged due to not returning since the last visit.  ?????         Annia Friendly, [place] 03/09/19 2:17 PM  [place], [place], [address] Phone: [phone]   Fax:  [phone]  Name: Tucker Steedley MRN: [id number] Date of Birth: Feb 26, 1961

## 2018-12-22 ENCOUNTER — Other Ambulatory Visit: Payer: BC Managed Care – PPO

## 2018-12-22 ENCOUNTER — Ambulatory Visit: Payer: BC Managed Care – PPO | Admitting: Adult Health

## 2018-12-22 ENCOUNTER — Ambulatory Visit: Payer: BC Managed Care – PPO

## 2018-12-23 ENCOUNTER — Inpatient Hospital Stay: Payer: BC Managed Care – PPO

## 2018-12-23 ENCOUNTER — Encounter: Payer: Self-pay | Admitting: *Deleted

## 2018-12-23 ENCOUNTER — Encounter: Payer: Self-pay | Admitting: Adult Health

## 2018-12-23 ENCOUNTER — Inpatient Hospital Stay: Payer: BC Managed Care – PPO | Admitting: Adult Health

## 2018-12-23 ENCOUNTER — Other Ambulatory Visit: Payer: Self-pay

## 2018-12-23 VITALS — BP 106/82 | HR 79 | Temp 98.9°F | Resp 18

## 2018-12-23 VITALS — BP 121/87 | HR 85 | Temp 98.5°F | Resp 17 | Ht 64.0 in | Wt 179.3 lb

## 2018-12-23 DIAGNOSIS — Z170 Estrogen receptor positive status [ER+]: Secondary | ICD-10-CM

## 2018-12-23 DIAGNOSIS — C50411 Malignant neoplasm of upper-outer quadrant of right female breast: Secondary | ICD-10-CM

## 2018-12-23 DIAGNOSIS — Z79899 Other long term (current) drug therapy: Secondary | ICD-10-CM | POA: Diagnosis not present

## 2018-12-23 DIAGNOSIS — Z5111 Encounter for antineoplastic chemotherapy: Secondary | ICD-10-CM | POA: Diagnosis not present

## 2018-12-23 DIAGNOSIS — Z5189 Encounter for other specified aftercare: Secondary | ICD-10-CM | POA: Diagnosis not present

## 2018-12-23 LAB — CBC WITH DIFFERENTIAL/PLATELET
Abs Immature Granulocytes: 0.02 10*3/uL (ref 0.00–0.07)
Basophils Absolute: 0 10*3/uL (ref 0.0–0.1)
Basophils Relative: 0 %
Eosinophils Absolute: 0 10*3/uL (ref 0.0–0.5)
Eosinophils Relative: 0 %
HCT: 40.4 % (ref 36.0–46.0)
Hemoglobin: 13.2 g/dL (ref 12.0–15.0)
Immature Granulocytes: 0 %
Lymphocytes Relative: 11 %
Lymphs Abs: 0.7 10*3/uL (ref 0.7–4.0)
MCH: 31.2 pg (ref 26.0–34.0)
MCHC: 32.7 g/dL (ref 30.0–36.0)
MCV: 95.5 fL (ref 80.0–100.0)
Monocytes Absolute: 0.1 10*3/uL (ref 0.1–1.0)
Monocytes Relative: 1 %
Neutro Abs: 5.5 10*3/uL (ref 1.7–7.7)
Neutrophils Relative %: 88 %
Platelets: 363 10*3/uL (ref 150–400)
RBC: 4.23 MIL/uL (ref 3.87–5.11)
RDW: 13.2 % (ref 11.5–15.5)
WBC: 6.3 10*3/uL (ref 4.0–10.5)
nRBC: 0 % (ref 0.0–0.2)

## 2018-12-23 LAB — COMPREHENSIVE METABOLIC PANEL
ALT: 9 U/L (ref 0–44)
AST: 20 U/L (ref 15–41)
Albumin: 3.7 g/dL (ref 3.5–5.0)
Alkaline Phosphatase: 107 U/L (ref 38–126)
Anion gap: 9 (ref 5–15)
BUN: 4 mg/dL — ABNORMAL LOW (ref 6–20)
CO2: 19 mmol/L — ABNORMAL LOW (ref 22–32)
Calcium: 9.6 mg/dL (ref 8.9–10.3)
Chloride: 110 mmol/L (ref 98–111)
Creatinine, Ser: 0.71 mg/dL (ref 0.44–1.00)
GFR calc Af Amer: >60 mL/min (ref >60)
GFR calc non Af Amer: >60 mL/min (ref >60)
Glucose, Bld: 141 mg/dL — ABNORMAL HIGH (ref 70–99)
Potassium: 4.4 mmol/L (ref 3.5–5.1)
Sodium: 138 mmol/L (ref 135–145)
Total Bilirubin: 0.3 mg/dL (ref 0.3–1.2)
Total Protein: 7.9 g/dL (ref 6.5–8.1)

## 2018-12-23 MED ORDER — DEXAMETHASONE SODIUM PHOSPHATE 10 MG/ML IJ SOLN
INTRAMUSCULAR | Status: AC
  Filled 2018-12-23: qty 1

## 2018-12-23 MED ORDER — SODIUM CHLORIDE 0.9 % IV SOLN
600.0000 mg/m2 | Freq: Once | INTRAVENOUS | Status: AC
  Administered 2018-12-23: 1180 mg via INTRAVENOUS
  Filled 2018-12-23: qty 59

## 2018-12-23 MED ORDER — PALONOSETRON HCL INJECTION 0.25 MG/5ML
0.2500 mg | Freq: Once | INTRAVENOUS | Status: AC
  Administered 2018-12-23: 0.25 mg via INTRAVENOUS

## 2018-12-23 MED ORDER — SODIUM CHLORIDE 0.9 % IV SOLN
75.0000 mg/m2 | Freq: Once | INTRAVENOUS | Status: AC
  Administered 2018-12-23: 150 mg via INTRAVENOUS
  Filled 2018-12-23: qty 15

## 2018-12-23 MED ORDER — PALONOSETRON HCL INJECTION 0.25 MG/5ML
INTRAVENOUS | Status: AC
  Filled 2018-12-23: qty 5

## 2018-12-23 MED ORDER — SODIUM CHLORIDE 0.9 % IV SOLN
Freq: Once | INTRAVENOUS | Status: AC
  Administered 2018-12-23: 11:00:00 via INTRAVENOUS
  Filled 2018-12-23: qty 250

## 2018-12-23 MED ORDER — DEXAMETHASONE SODIUM PHOSPHATE 10 MG/ML IJ SOLN
10.0000 mg | Freq: Once | INTRAMUSCULAR | Status: AC
  Administered 2018-12-23: 10 mg via INTRAVENOUS

## 2018-12-23 NOTE — Progress Notes (Addendum)
[place]  [phone] [phone]     ID: Alice [name] DOB: 1960/11/01  MR#: [id number]  RWE#:[id number]  Patient Care Team: Carlena Hurl, PA-C as PCP - General (Family Medicine) Mauro Kaufmann, RN as Oncology Nurse Navigator Rockwell Germany, RN as Oncology Nurse Navigator Magrinat, Virgie Dad, MD as Consulting Physician (Oncology) Kyung Rudd, MD as Consulting Physician (Radiation Oncology) Rolm Bookbinder, MD as Consulting Physician (General Surgery) Earnstine Regal, PA-C as Consulting Physician (Obstetrics and Gynecology) Juanita Craver, MD as Consulting Physician (Gastroenterology) [name], MD as Consulting Physician (Family Medicine) Cristine Polio, MD as Consulting Physician (Plastic Surgery) Scot Dock, NP OTHER MD:    CHIEF COMPLAINT: Estrogen receptor positive breast cancer  CURRENT TREATMENT: Adjuvant chemotherapy   INTERVAL HISTORY: Alice [name] was seen today for follow up of her estrogen receptor positive breast cancer.  She is here today to start adjuvant chemotherapy with Docetaxel and Cyclophosphamide given every 21 days x 4 cycles.  This is with Neulasta support.    REVIEW OF SYSTEMS: [name] notes that she is very nervous about receiving her chemotherapy treatments.  She isn't sure how the treatment is going to affect her and that's what makes her concerned.  She met with the chemotherapy education nurse, and has picked up her anti estrogen therapy.  She is feeling well otherwise, and a detailed ROS was otherwise non contributory.    HISTORY OF CURRENT ILLNESS: From the original intake note:  Alice [name] has a history of remote right breast cancer in 1997, treated with right lumpectomy and radiation in [place].  The patient did not receive chemotherapy or antiestrogen treatments.  More recently she presented for her annual mammogram with a non-tender area of firmness in the right  breast at 9 o'clock for 2 months. She underwent bilateral diagnostic mammography with tomography and right breast ultrasonography at [facility] on 09/08/2018 showing: breast density category A; scattered-appearing fibroglandular tissue at the lumpectomy site in the upper outer right breast middle depth spanning 3.4 cm, with indistinct margins, was not seen on prior mammogram from 2018.  There was also a keloid scar in the inferior medial left breast.  On physical exam there was a rectangular area of hardness measuring up to 7 cm in the right breast at 9:00, corresponding with the prior lumpectomy and consistent with radiation change.  Ultrasound of this area showed only postlumpectomy change, with no suspicious or discrete mass.  Ultrasound of the axilla was benign  Accordingly on 09/23/2018 she proceeded to biopsy of the right breast area in question. The pathology from this procedure SAA 20-07/02/2000 showed: invasive ductal carcinoma, E-cadherin positive,  grade 3,  Prognostic indicators significant for: estrogen receptor at 100% positive and progesterone receptor, 30% positive, both with strong staining intensity proliferation marker Ki67 at 70%. HER2 negative by immunohistochemistry (0).  The patient's subsequent history is as detailed below.   PAST MEDICAL HISTORY: Past Medical History:  Diagnosis Date   Anemia    Breast cancer ([place])    right in 1997   Family history of lung cancer    Family history of ovarian cancer    GERD (gastroesophageal reflux disease) 06/30/2018   Herpes simplex type 2 infection 02/2006   History of breast cancer 1997   History of esophagogastroduodenoscopy (EGD) 09/15/2015   small hiatal hernia was noted otherwise all was normal   Personal history of breast cancer     PAST SURGICAL HISTORY: Past Surgical History:  Procedure Laterality Date  BREAST ENHANCEMENT SURGERY     s/p cancer and surgery, right   BREAST LUMPECTOMY     right due to breast cancer     MASTECTOMY W/ SENTINEL NODE BIOPSY Right 10/22/2018   Procedure: RIGHT MASTECTOMY WITH RIGHT AXILLARY SENTINEL LYMPH NODE BIOPSY;  Surgeon: Rolm Bookbinder, MD;  Location: [place] [facility];  Service: General;  Laterality: Right;  wisdom teeth removal   FAMILY HISTORY: Family History  Problem Relation Age of Onset   Ovarian cancer Sister 9   Dementia Mother    Aneurysm Father    Patient's father was 73 years old when he died from aneurysm. Patient's mother died from dementia at age 53. Her sister was diagnosed with ovarian cancer at age 37, and this subsequently took her life.  In total of the patient has 5 brothers and 2 sisters.  She is not aware of other cancers in the family  GYNECOLOGIC HISTORY:  No LMP recorded. Patient is postmenopausal. Menarche: 58 years old [place] P 0 LMP age 52 Contraceptive none HRT none  Hysterectomy? no BSO? 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 She is married. Husband Edmonia Caprio is from [place], so his native language is [place]. She lives at home with her husband. They have no children. She attends a [employer] in [place].    ADVANCED DIRECTIVES: In the absence of documents to the contrary her husband is automatically her HCPOA.   HEALTH MAINTENANCE: Social History   Tobacco Use   Smoking status: Never Smoker   Smokeless tobacco: Never Used  Substance Use Topics   Alcohol use: No    Alcohol/week: 0.0 standard drinks   Drug use: No     Colonoscopy: endoscopy in 2017, colonoscopy due 2020 but delayed due to virus  PAP: 12/03/2017, normal  Bone density: never done   No Known Allergies  Current Outpatient Medications  Medication Sig Dispense Refill   anastrozole (ARIMIDEX) 1 MG tablet Take 1 tablet (1 mg total) by mouth daily. 90 tablet 4   dexamethasone (DECADRON) 4 MG tablet Take 2 tablets (8 mg total) by mouth 2 (two) times daily. Start the  day before Taxotere. Then again the day after chemo for 3 days. 30 tablet 1   lidocaine-prilocaine (EMLA) cream Apply to affected area once 30 g 3   LORazepam (ATIVAN) 0.5 MG tablet Take 1 tablet (0.5 mg total) by mouth at bedtime as needed (Nausea or vomiting). 30 tablet 0   oxyCODONE (OXY IR/ROXICODONE) 5 MG immediate release tablet Take 1-2 tablets (5-10 mg total) by mouth every 4 (four) hours as needed for moderate pain. 10 tablet 0   prochlorperazine (COMPAZINE) 10 MG tablet Take 1 tablet (10 mg total) by mouth every 6 (six) hours as needed (Nausea or vomiting). 30 tablet 1   No current facility-administered medications for this visit.    Facility-Administered Medications Ordered in Other Visits  Medication Dose Route Frequency Provider Last Rate Last Dose   cyclophosphamide (CYTOXAN) 1,180 mg in sodium chloride 0.9 % 250 mL chemo infusion  600 mg/m2 (Order-Specific) Intravenous Once Magrinat, Virgie Dad, MD       DOCEtaxel (TAXOTERE) 150 mg in sodium chloride 0.9 % 250 mL chemo infusion  75 mg/m2 (Order-Specific) Intravenous Once Chauncey Cruel, MD 133 mL/hr at 12/23/18 1242      OBJECTIVE:  BP 121/87 (BP Location: Left Arm, Patient Position: Sitting)    Pulse 85  Temp 98.5 F (36.9 C) (Oral)    Resp 17    Ht _0  (1.626 m)    Wt 179 lb 4.8 oz (81.3 kg)    SpO2 100%    BMI 30.78 kg/m  Filed Weights   12/23/18 0824  Weight: 179 lb 4.8 oz (81.3 kg)   ECOG: 0 GENERAL: Patient is a well appearing female in no acute distress HEENT:  Sclerae anicteric.  Oropharynx clear and moist. No ulcerations or evidence of oropharyngeal candidiasis. Neck is supple.  NODES:  No cervical, supraclavicular, or axillary lymphadenopathy palpated.  BREAST EXAM: right breast s/p mastectomy, well healed, no sign of local recurrence. LUNGS:  Clear to auscultation bilaterally.  No wheezes or rhonchi. HEART:  Regular rate and rhythm. No murmur appreciated. ABDOMEN:  Soft, nontender.  Positive,  normoactive bowel sounds. No organomegaly palpated. MSK:  No focal spinal tenderness to palpation. Full range of motion bilaterally in the upper extremities. EXTREMITIES:  No peripheral edema.   SKIN:  Clear with no obvious rashes or skin changes. No nail dyscrasia. NEURO:  Nonfocal. Well oriented.  Appropriate affect.    LAB RESULTS:  CMP     Component Value Date/Time   NA 138 12/23/2018 0804   K 4.4 12/23/2018 0804   CL 110 12/23/2018 0804   CO2 19 (L) 12/23/2018 0804   GLUCOSE 141 (H) 12/23/2018 0804   BUN 4 (L) 12/23/2018 0804   CREATININE 0.71 12/23/2018 0804   CREATININE 0.73 09/30/2018 0819   CALCIUM 9.6 12/23/2018 0804   PROT 7.9 12/23/2018 0804   ALBUMIN 3.7 12/23/2018 0804   AST 20 12/23/2018 0804   AST 20 09/30/2018 0819   ALT 9 12/23/2018 0804   ALT 6 09/30/2018 0819   ALKPHOS 107 12/23/2018 0804   BILITOT 0.3 12/23/2018 0804   BILITOT 0.5 09/30/2018 0819   GFRNONAA >60 12/23/2018 0804   GFRNONAA >60 09/30/2018 0819   GFRAA >60 12/23/2018 0804   GFRAA >60 09/30/2018 0819    No results found for: TOTALPROTELP, ALBUMINELP, A1GS, A2GS, BETS, BETA2SER, GAMS, MSPIKE, SPEI  No results found for: KPAFRELGTCHN, LAMBDASER, [facility]  Lab Results  Component Value Date   WBC 6.3 12/23/2018   NEUTROABS 5.5 12/23/2018   HGB 13.2 12/23/2018   HCT 40.4 12/23/2018   MCV 95.5 12/23/2018   PLT 363 12/23/2018    _1 @  No results found for: LABCA2  No components found for: [id number]  No results for input(s): INR in the last 168 hours.  No results found for: LABCA2  No results found for: BHA193  No results found for: XTK240  No results found for: XBD532  No results found for: CA2729  No components found for: HGQUANT  No results found for: CEA1 / No results found for: CEA1   No results found for: AFPTUMOR  No results found for: [place]  No results found for: PSA1  Appointment on 12/23/2018  Component Date Value Ref Range Status     WBC 12/23/2018 6.3  4.0 - 10.5 K/uL Final   RBC 12/23/2018 4.23  3.87 - 5.11 MIL/uL Final   Hemoglobin 12/23/2018 13.2  12.0 - 15.0 g/dL Final   HCT 12/23/2018 40.4  36.0 - 46.0 % Final   MCV 12/23/2018 95.5  80.0 - 100.0 fL Final   MCH 12/23/2018 31.2  26.0 - 34.0 pg Final   MCHC 12/23/2018 32.7  30.0 - 36.0 g/dL Final   RDW 12/23/2018 13.2  11.5 - 15.5 % Final   Platelets 12/23/2018  363  150 - 400 K/uL Final   nRBC 12/23/2018 0.0  0.0 - 0.2 % Final   Neutrophils Relative % 12/23/2018 88  % Final   Neutro Abs 12/23/2018 5.5  1.7 - 7.7 K/uL Final   Lymphocytes Relative 12/23/2018 11  % Final   Lymphs Abs 12/23/2018 0.7  0.7 - 4.0 K/uL Final   Monocytes Relative 12/23/2018 1  % Final   Monocytes Absolute 12/23/2018 0.1  0.1 - 1.0 K/uL Final   Eosinophils Relative 12/23/2018 0  % Final   Eosinophils Absolute 12/23/2018 0.0  0.0 - 0.5 K/uL Final   Basophils Relative 12/23/2018 0  % Final   Basophils Absolute 12/23/2018 0.0  0.0 - 0.1 K/uL Final   Immature Granulocytes 12/23/2018 0  % Final   Abs Immature Granulocytes 12/23/2018 0.02  0.00 - 0.07 K/uL Final   Performed at [facility], [place] [address], [place], [place] [address]   Sodium 12/23/2018 138  135 - 145 mmol/L Final   Potassium 12/23/2018 4.4  3.5 - 5.1 mmol/L Final   Chloride 12/23/2018 110  98 - 111 mmol/L Final   CO2 12/23/2018 19* 22 - 32 mmol/L Final   Glucose, Bld 12/23/2018 141* 70 - 99 mg/dL Final   BUN 12/23/2018 4* 6 - 20 mg/dL Final   Creatinine, Ser 12/23/2018 0.71  0.44 - 1.00 mg/dL Final   Calcium 12/23/2018 9.6  8.9 - 10.3 mg/dL Final   Total Protein 12/23/2018 7.9  6.5 - 8.1 g/dL Final   Albumin 12/23/2018 3.7  3.5 - 5.0 g/dL Final   AST 12/23/2018 20  15 - 41 U/L Final   ALT 12/23/2018 9  0 - 44 U/L Final   Alkaline Phosphatase 12/23/2018 107  38 - 126 U/L Final   Total Bilirubin 12/23/2018 0.3  0.3 - 1.2 mg/dL Final   GFR calc non Af Amer  12/23/2018 >60  >60 mL/min Final   GFR calc Af Amer 12/23/2018 >60  >60 mL/min Final   Anion gap 12/23/2018 9  5 - 15 Final   Performed at [facility], [place] [address] [place], [place] [address]    (this displays the last labs from the last 3 days)  No results found for: TOTALPROTELP, ALBUMINELP, A1GS, A2GS, BETS, BETA2SER, GAMS, MSPIKE, SPEI (this displays SPEP labs)  No results found for: KPAFRELGTCHN, LAMBDASER, KAPLAMBRATIO (kappa/lambda light chains)  No results found for: HGBA, HGBA2QUANT, HGBFQUANT, HGBSQUAN (Hemoglobinopathy evaluation)   No results found for: LDH  No results found for: IRON, TIBC, IRONPCTSAT (Iron and TIBC)  No results found for: FERRITIN  Urinalysis No results found for: COLORURINE, APPEARANCEUR, LABSPEC, PHURINE, GLUCOSEU, HGBUR, BILIRUBINUR, KETONESUR, PROTEINUR, UROBILINOGEN, NITRITE, LEUKOCYTESUR   STUDIES: No results found.  ELIGIBLE FOR AVAILABLE RESEARCH PROTOCOL: no  ASSESSMENT: 58 y.o. Alice [name] woman   (1) status post right lumpectomy in 1997,  (a) status post adjuvant radiation  (b) did not receive antiestrogens or chemotherapy  (2) status post right breast upper outer quadrant biopsy 09/23/2018 for a clinical T2 N0, stage IIA invasive ductal carcinoma, grade 3, estrogen and progesterone receptor positive, HER-2 not amplified, with an MIB-1 of 70%  (3) Oncotype obtained from the initial biopsy showed a score of 26 predicting a risk of recurrence outside the breast of 16/21% (depending on nodal status), if the patient's only systemic therapy is an antiestrogen for 5 years.  It also predicts a significant benefit from chemotherapy  (4) status post right mastectomy and sentinel  lymph node sampling 10/22/2018 for a pT2 pN0, stage IB invasive ductal carcinoma, grade 2, with negative margins  (a) not planning reconstruction at this point  (5) adjuvant chemotherapy recommended on 11/05/2018 by Dr. Jana Hakim  (a)  second opinion with Dr. Annabell Sabal at [facility] on 11/18/2018 recommended 4 cycles of adjuvant docetaxel and cyclophosphamide   (b) Third opinion with Dr. Adan Sis on 11/24/2018 recommended 4 cycles of adjuvant Docetaxel and Cyclophosphamide  (c) To start adjuvant chemotherapy with four cycles of Docetaxel and Cyclophosphamide on 12/23/2018  (6) anastrozole started 09/30/2018 (patient stopped due to concerns over bone loss--to restart once adjuvant therapy has completed)  (a) Bone density on 12/10/2018 shows a T score of -3.8 in the AP spine.    (7) genetics testing 10/06/2018 through the [facility] Common Hereditary Cancers Panel found no deleterious mutations in APC, ATM, AXIN2, BARD1, BMPR1A, BRCA1, BRCA2, BRIP1, CDH1, CDKN2A (p14ARF), CDKN2A (p16INK4a), CKD4, CHEK2, CTNNA1, DICER1, EPCAM (Deletion/duplication testing only), GREM1 (promoter region deletion/duplication testing only), KIT, MEN1, MLH1, MSH2, MSH3, MSH6, MUTYH, NBN, NF1, NHTL1, PALB2, PDGFRA, PMS2, POLD1, POLE, PTEN, RAD50, RAD51C, RAD51D, SDHB, SDHC, SDHD, SMAD4, SMARCA4. STK11, TP53, TSC1, TSC2, and VHL. The following genes were evaluated for sequence changes only: SDHA and HOXB13 c.251G>A variant only.   (a) 2 VUS's identified: VUS in APC c.-34014C>T and VHL c.340+272T>G identified   PLAN: Gerarda is doing well today.  I reviewed her CBC with her in detail.  I reviewed her chemotherapy regimen again, risks/benefits in detail.  We reviewed Neulasta.  She understands her chemotherapy regimen, along with the anti nasuea medication.  She will proceed with her first cycle of Docetaxel and Cyclophosphamide. She also met with Dr. Jana Hakim again today who explained everything in detail.     Haily and I also discussed her bone density results which are consistent with osteoporosis. She is actively working on losing weight and has lost 20 pounds with changing her diet this far.  I gave her information on bone health.  I assured her that we will talk about  bisphosphanate therapy along with antiestrogen therapies once she completes her chemotherapy.  We will see [place] back in 1 week for labs, f/u.  She was recommended to continue with the appropriate pandemic precautions. She knows to call for any questions that may arise between now and her next appointment.  We are happy to see her sooner if needed.   Wilber Bihari, NP   12/23/2018 1:02 PM Medical Oncology and Hematology [facility] [address] [place], [place] [address] Tel. [phone]    Fax. [phone]   ADDENDUM: Delayla has done an incredible amount of research to understand why she needs chemotherapy, something anyone would rather avoid. She agrees to proceed, and has also done quite a bit of reading in the specific agents she is receiving. She is aware of the risk of permanent alopecia and of possibly pemanent neuropathy, as well as the multiple other reversible side effects, toxicties and complications possible. She agrees to proceed.  I asked her to keep a diary of symptoms so we can address problems when she returns to see [place] 09/02. I urged her to call us before then with any other problems that might arise.  I personally saw this patient and performed a substantive portion of this encounter with the listed APP documented above.   Chauncey Cruel, MD Medical Oncology and Hematology [facility] [address] [place], [place] [address] Tel. [phone]  Fax. [phone]

## 2018-12-23 NOTE — Patient Instructions (Addendum)
[facility] Discharge Instructions for Patients Receiving Chemotherapy  Today you received the following chemotherapy agents Taxotere and Cytixan  To help prevent nausea and vomiting after your treatment, we encourage you to take your nausea medication as directed    If you develop nausea and vomiting that is not controlled by your nausea medication, call the clinic.   BELOW ARE SYMPTOMS THAT SHOULD BE REPORTED IMMEDIATELY:  *FEVER GREATER THAN 100.5 F  *CHILLS WITH OR WITHOUT FEVER  NAUSEA AND VOMITING THAT IS NOT CONTROLLED WITH YOUR NAUSEA MEDICATION  *UNUSUAL SHORTNESS OF BREATH  *UNUSUAL BRUISING OR BLEEDING  TENDERNESS IN MOUTH AND THROAT WITH OR WITHOUT PRESENCE OF ULCERS  *URINARY PROBLEMS  *BOWEL PROBLEMS  UNUSUAL RASH Items with * indicate a potential emergency and should be followed up as soon as possible.  Feel free to call the clinic should you have any questions or concerns. The clinic phone number is [phone].  Please show the [place] at check-in to the Emergency Department and triage nurse.  Docetaxel injection(Taxotere) What is this medicine? DOCETAXEL (doe se TAX el) is a chemotherapy drug. It targets fast dividing cells, like cancer cells, and causes these cells to die. This medicine is used to treat many types of cancers like breast cancer, certain stomach cancers, head and neck cancer, lung cancer, and prostate cancer. This medicine may be used for other purposes; ask your health care provider or pharmacist if you have questions. COMMON BRAND NAME(S): Docefrez, Taxotere What should I tell my health care provider before I take this medicine? They need to know if you have any of these conditions:  infection (especially a virus infection such as chickenpox, cold sores, or herpes)  liver disease  low blood counts, like low white cell, platelet, or red cell counts  an unusual or allergic reaction to docetaxel, polysorbate  80, other chemotherapy agents, other medicines, foods, dyes, or preservatives  pregnant or trying to get pregnant  breast-feeding How should I use this medicine? This drug is given as an infusion into a vein. It is administered in a hospital or clinic by a specially trained health care professional. Talk to your pediatrician regarding the use of this medicine in children. Special care may be needed. Overdosage: If you think you have taken too much of this medicine contact a poison control center or emergency room at once. NOTE: This medicine is only for you. Do not share this medicine with others. What if I miss a dose? It is important not to miss your dose. Call your doctor or health care professional if you are unable to keep an appointment. What may interact with this medicine?  aprepitant  certain antibiotics like erythromycin or clarithromycin  certain antivirals for HIV or hepatitis  certain medicines for fungal infections like fluconazole, itraconazole, ketoconazole, posaconazole, or voriconazole  cimetidine  ciprofloxacin  conivaptan  cyclosporine  dronedarone  fluvoxamine  grapefruit juice  imatinib  verapamil This list may not describe all possible interactions. Give your health care provider a list of all the medicines, herbs, non-prescription drugs, or dietary supplements you use. Also tell them if you smoke, drink alcohol, or use illegal drugs. Some items may interact with your medicine. What should I watch for while using this medicine? Your condition will be monitored carefully while you are receiving this medicine. You will need important blood work done while you are taking this medicine. Call your doctor or health care professional for advice if you get a  fever, chills or sore throat, or other symptoms of a cold or flu. Do not treat yourself. This drug decreases your body's ability to fight infections. Try to avoid being around people who are sick. Some  products may contain alcohol. Ask your health care professional if this medicine contains alcohol. Be sure to tell all health care professionals you are taking this medicine. Certain medicines, like metronidazole and disulfiram, can cause an unpleasant reaction when taken with alcohol. The reaction includes flushing, headache, nausea, vomiting, sweating, and increased thirst. The reaction can last from 30 minutes to several hours. You may get drowsy or dizzy. Do not drive, use machinery, or do anything that needs mental alertness until you know how this medicine affects you. Do not stand or sit up quickly, especially if you are an older patient. This reduces the risk of dizzy or fainting spells. Alcohol may interfere with the effect of this medicine. Talk to your health care professional about your risk of cancer. You may be more at risk for certain types of cancer if you take this medicine. Do not become pregnant while taking this medicine or for 6 months after stopping it. Women should inform their doctor if they wish to become pregnant or think they might be pregnant. There is a potential for serious side effects to an unborn child. Talk to your health care professional or pharmacist for more information. Do not breast-feed an infant while taking this medicine or for 1 to 2 weeks after stopping it. Males who get this medicine must use a condom during sex with females who can get pregnant. If you get a woman pregnant, the baby could have birth defects. The baby could die before they are born. You will need to continue wearing a condom for 3 months after stopping the medicine. Tell your health care provider right away if your partner becomes pregnant while you are taking this medicine. This may interfere with the ability to father a child. You should talk to your doctor or health care professional if you are concerned about your fertility. What side effects may I notice from receiving this medicine? Side  effects that you should report to your doctor or health care professional as soon as possible:  allergic reactions like skin rash, itching or hives, swelling of the face, lips, or tongue  blurred vision  breathing problems  changes in vision  low blood counts - This drug may decrease the number of white blood cells, red blood cells and platelets. You may be at increased risk for infections and bleeding.  nausea and vomiting  pain, redness or irritation at site where injected  pain, tingling, numbness in the hands or feet  redness, blistering, peeling, or loosening of the skin, including inside the mouth  signs of decreased platelets or bleeding - bruising, pinpoint red spots on the skin, black, tarry stools, nosebleeds  signs of decreased red blood cells - unusually weak or tired, fainting spells, lightheadedness  signs of infection - fever or chills, cough, sore throat, pain or difficulty passing urine  swelling of the ankle, feet, hands Side effects that usually do not require medical attention (report to your doctor or health care professional if they continue or are bothersome):  constipation  diarrhea  fingernail or toenail changes  hair loss  loss of appetite  mouth sores  muscle pain This list may not describe all possible side effects. Call your doctor for medical advice about side effects. You may report side effects to  FDA at 1-800-FDA-1088. Where should I keep my medicine? This drug is given in a hospital or clinic and will not be stored at home. NOTE: This sheet is a summary. It may not cover all possible information. If you have questions about this medicine, talk to your doctor, pharmacist, or health care provider.  2020 Elsevier/Gold Standard (2018-06-19 12:23:11)   Cyclophosphamide injection(cytoxan) What is this medicine? CYCLOPHOSPHAMIDE (sye kloe FOSS fa mide) is a chemotherapy drug. It slows the growth of cancer cells. This medicine is used to  treat many types of cancer like lymphoma, myeloma, leukemia, breast cancer, and ovarian cancer, to name a few. This medicine may be used for other purposes; ask your health care provider or pharmacist if you have questions. COMMON BRAND NAME(S): Cytoxan, Neosar What should I tell my health care provider before I take this medicine? They need to know if you have any of these conditions:  blood disorders  history of other chemotherapy  infection  kidney disease  liver disease  recent or ongoing radiation therapy  tumors in the bone marrow  an unusual or allergic reaction to cyclophosphamide, other chemotherapy, other medicines, foods, dyes, or preservatives  pregnant or trying to get pregnant  breast-feeding How should I use this medicine? This drug is usually given as an injection into a vein or muscle or by infusion into a vein. It is administered in a hospital or clinic by a specially trained health care professional. Talk to your pediatrician regarding the use of this medicine in children. Special care may be needed. Overdosage: If you think you have taken too much of this medicine contact a poison control center or emergency room at once. NOTE: This medicine is only for you. Do not share this medicine with others. What if I miss a dose? It is important not to miss your dose. Call your doctor or health care professional if you are unable to keep an appointment. What may interact with this medicine? This medicine may interact with the following medications:  amiodarone  amphotericin B  azathioprine  certain antiviral medicines for HIV or AIDS such as protease inhibitors (e.g., indinavir, ritonavir) and zidovudine  certain blood pressure medications such as benazepril, captopril, enalapril, fosinopril, lisinopril, moexipril, monopril, perindopril, quinapril, ramipril, trandolapril  certain cancer medications such as anthracyclines (e.g., daunorubicin, doxorubicin),  busulfan, cytarabine, paclitaxel, pentostatin, tamoxifen, trastuzumab  certain diuretics such as chlorothiazide, chlorthalidone, hydrochlorothiazide, indapamide, metolazone  certain medicines that treat or prevent blood clots like warfarin  certain muscle relaxants such as succinylcholine  cyclosporine  etanercept  indomethacin  medicines to increase blood counts like filgrastim, pegfilgrastim, sargramostim  medicines used as general anesthesia  metronidazole  natalizumab This list may not describe all possible interactions. Give your health care provider a list of all the medicines, herbs, non-prescription drugs, or dietary supplements you use. Also tell them if you smoke, drink alcohol, or use illegal drugs. Some items may interact with your medicine. What should I watch for while using this medicine? Visit your doctor for checks on your progress. This drug may make you feel generally unwell. This is not uncommon, as chemotherapy can affect healthy cells as well as cancer cells. Report any side effects. Continue your course of treatment even though you feel ill unless your doctor tells you to stop. Drink water or other fluids as directed. Urinate often, even at night. In some cases, you may be given additional medicines to help with side effects. Follow all directions for their use. Call your  doctor or health care professional for advice if you get a fever, chills or sore throat, or other symptoms of a cold or flu. Do not treat yourself. This drug decreases your body's ability to fight infections. Try to avoid being around people who are sick. This medicine may increase your risk to bruise or bleed. Call your doctor or health care professional if you notice any unusual bleeding. Be careful brushing and flossing your teeth or using a toothpick because you may get an infection or bleed more easily. If you have any dental work done, tell your dentist you are receiving this medicine. You  may get drowsy or dizzy. Do not drive, use machinery, or do anything that needs mental alertness until you know how this medicine affects you. Do not become pregnant while taking this medicine or for 1 year after stopping it. Women should inform their doctor if they wish to become pregnant or think they might be pregnant. Men should not father a child while taking this medicine and for 4 months after stopping it. There is a potential for serious side effects to an unborn child. Talk to your health care professional or pharmacist for more information. Do not breast-feed an infant while taking this medicine. This medicine may interfere with the ability to have a child. This medicine has caused ovarian failure in some women. This medicine has caused reduced sperm counts in some men. You should talk with your doctor or health care professional if you are concerned about your fertility. If you are going to have surgery, tell your doctor or health care professional that you have taken this medicine. What side effects may I notice from receiving this medicine? Side effects that you should report to your doctor or health care professional as soon as possible:  allergic reactions like skin rash, itching or hives, swelling of the face, lips, or tongue  low blood counts - this medicine may decrease the number of white blood cells, red blood cells and platelets. You may be at increased risk for infections and bleeding.  signs of infection - fever or chills, cough, sore throat, pain or difficulty passing urine  signs of decreased platelets or bleeding - bruising, pinpoint red spots on the skin, black, tarry stools, blood in the urine  signs of decreased red blood cells - unusually weak or tired, fainting spells, lightheadedness  breathing problems  dark urine  dizziness  palpitations  swelling of the ankles, feet, hands  trouble passing urine or change in the amount of urine  weight gain  yellowing  of the eyes or skin Side effects that usually do not require medical attention (report to your doctor or health care professional if they continue or are bothersome):  changes in nail or skin color  hair loss  missed menstrual periods  mouth sores  nausea, vomiting This list may not describe all possible side effects. Call your doctor for medical advice about side effects. You may report side effects to FDA at 1-800-FDA-1088. Where should I keep my medicine? This drug is given in a hospital or clinic and will not be stored at home. NOTE: This sheet is a summary. It may not cover all possible information. If you have questions about this medicine, talk to your doctor, pharmacist, or health care provider.  2020 Elsevier/Gold Standard (2012-02-28 16:22:58)

## 2018-12-23 NOTE — Patient Instructions (Signed)

## 2018-12-24 ENCOUNTER — Telehealth: Payer: Self-pay | Admitting: *Deleted

## 2018-12-24 NOTE — Telephone Encounter (Signed)
Spoke to pt to assess needs after 1st TC. Relate doing well. Denies n/v or fatigue. Discussed anti-nausea medications and hydration as well as a walking program during chemotherapy treatment. Discussed cryotherapy for hands and feet for next TC treatment. Denies further questions or needs at this time. Encourage pt to call with concerns Received verbal understanding.

## 2018-12-25 ENCOUNTER — Inpatient Hospital Stay: Payer: BC Managed Care – PPO

## 2018-12-25 ENCOUNTER — Other Ambulatory Visit: Payer: Self-pay

## 2018-12-25 ENCOUNTER — Encounter: Payer: Self-pay | Admitting: Adult Health

## 2018-12-25 ENCOUNTER — Telehealth: Payer: Self-pay | Admitting: *Deleted

## 2018-12-25 VITALS — BP 100/70 | HR 73 | Temp 98.0°F | Resp 17

## 2018-12-25 DIAGNOSIS — C50411 Malignant neoplasm of upper-outer quadrant of right female breast: Secondary | ICD-10-CM

## 2018-12-25 DIAGNOSIS — Z170 Estrogen receptor positive status [ER+]: Secondary | ICD-10-CM

## 2018-12-25 DIAGNOSIS — Z5111 Encounter for antineoplastic chemotherapy: Secondary | ICD-10-CM | POA: Diagnosis not present

## 2018-12-25 DIAGNOSIS — Z79899 Other long term (current) drug therapy: Secondary | ICD-10-CM | POA: Diagnosis not present

## 2018-12-25 DIAGNOSIS — Z5189 Encounter for other specified aftercare: Secondary | ICD-10-CM | POA: Diagnosis not present

## 2018-12-25 MED ORDER — PEGFILGRASTIM-JMDB 6 MG/0.6ML ~~LOC~~ SOSY
6.0000 mg | PREFILLED_SYRINGE | Freq: Once | SUBCUTANEOUS | Status: AC
  Administered 2018-12-25: 6 mg via SUBCUTANEOUS
  Filled 2018-12-25: qty 0.6

## 2018-12-25 NOTE — Progress Notes (Signed)
Pt stated she has noticed that her bilateral fingertips are beginning to feel "tingly, and burning" not constant but "off and on". Pt advised to notify RN/MD next visit. Pt verbalized understanding.

## 2018-12-25 NOTE — Telephone Encounter (Signed)
Called pt to see how she did with her treatment on 8/26.  She had her inj today & felt a little queezy & achey after getting home.  She took nausea med but had not taken claritin this am.  Instructed to take claritin before her shot next time & go ahead & take today & cont for several days.  She expressed understanding for bone pain.  She states she knows to call for questions/concerns & knows how to get in touch with [place].

## 2018-12-25 NOTE — Telephone Encounter (Signed)
-----   Message from Royston Bake, RN sent at 12/23/2018  3:41 PM EDT ----- Regarding: Dr Jana Hakim Patient first time Taxotere Cytoxan Dr. Jana Hakim patient first time taxotere and cytoxan

## 2018-12-25 NOTE — Patient Instructions (Signed)

## 2018-12-25 NOTE — Progress Notes (Signed)
Met with patient at registration to introduce myself as Arboriculturist and to offer available resources.  Discussed one-time $1000 Radio broadcast assistant to assist with personal expenses while going through treatment. Based on income guidelines, patient states her household exceeds those guidelines.  Also asked about insurance ded/OOP. Patient states she has met everything. Advised there was copay assistance available for injection if needed. She verbalized understanding.  Gave her my card for any additional financial questions or concerns.

## 2018-12-28 ENCOUNTER — Ambulatory Visit: Payer: BC Managed Care – PPO

## 2018-12-29 DIAGNOSIS — Z170 Estrogen receptor positive status [ER+]: Secondary | ICD-10-CM | POA: Diagnosis not present

## 2018-12-29 DIAGNOSIS — C50411 Malignant neoplasm of upper-outer quadrant of right female breast: Secondary | ICD-10-CM | POA: Diagnosis not present

## 2018-12-29 DIAGNOSIS — K219 Gastro-esophageal reflux disease without esophagitis: Secondary | ICD-10-CM | POA: Diagnosis not present

## 2018-12-29 DIAGNOSIS — J342 Deviated nasal septum: Secondary | ICD-10-CM | POA: Diagnosis not present

## 2018-12-30 ENCOUNTER — Inpatient Hospital Stay: Payer: BC Managed Care – PPO | Attending: Oncology

## 2018-12-30 ENCOUNTER — Encounter: Payer: Self-pay | Admitting: Adult Health

## 2018-12-30 ENCOUNTER — Inpatient Hospital Stay (HOSPITAL_BASED_OUTPATIENT_CLINIC_OR_DEPARTMENT_OTHER): Payer: BC Managed Care – PPO | Admitting: Adult Health

## 2018-12-30 ENCOUNTER — Inpatient Hospital Stay: Payer: BC Managed Care – PPO

## 2018-12-30 ENCOUNTER — Other Ambulatory Visit: Payer: Self-pay

## 2018-12-30 VITALS — BP 104/70 | HR 105 | Temp 99.1°F | Resp 18 | Ht 64.0 in | Wt 178.5 lb

## 2018-12-30 DIAGNOSIS — Z5189 Encounter for other specified aftercare: Secondary | ICD-10-CM | POA: Diagnosis not present

## 2018-12-30 DIAGNOSIS — E860 Dehydration: Secondary | ICD-10-CM | POA: Diagnosis not present

## 2018-12-30 DIAGNOSIS — Z5111 Encounter for antineoplastic chemotherapy: Secondary | ICD-10-CM | POA: Insufficient documentation

## 2018-12-30 DIAGNOSIS — R21 Rash and other nonspecific skin eruption: Secondary | ICD-10-CM | POA: Insufficient documentation

## 2018-12-30 DIAGNOSIS — D709 Neutropenia, unspecified: Secondary | ICD-10-CM | POA: Diagnosis not present

## 2018-12-30 DIAGNOSIS — Z170 Estrogen receptor positive status [ER+]: Secondary | ICD-10-CM | POA: Diagnosis not present

## 2018-12-30 DIAGNOSIS — C50411 Malignant neoplasm of upper-outer quadrant of right female breast: Secondary | ICD-10-CM | POA: Insufficient documentation

## 2018-12-30 DIAGNOSIS — R1012 Left upper quadrant pain: Secondary | ICD-10-CM | POA: Insufficient documentation

## 2018-12-30 DIAGNOSIS — K219 Gastro-esophageal reflux disease without esophagitis: Secondary | ICD-10-CM | POA: Diagnosis not present

## 2018-12-30 DIAGNOSIS — Z79811 Long term (current) use of aromatase inhibitors: Secondary | ICD-10-CM | POA: Diagnosis not present

## 2018-12-30 DIAGNOSIS — Z79899 Other long term (current) drug therapy: Secondary | ICD-10-CM | POA: Diagnosis not present

## 2018-12-30 LAB — URINALYSIS, COMPLETE (UACMP) WITH MICROSCOPIC
Bacteria, UA: NONE SEEN
Bilirubin Urine: NEGATIVE
Glucose, UA: NEGATIVE mg/dL
Hgb urine dipstick: NEGATIVE
Ketones, ur: NEGATIVE mg/dL
Leukocytes,Ua: NEGATIVE
Nitrite: NEGATIVE
Protein, ur: NEGATIVE mg/dL
Specific Gravity, Urine: 1.004 — ABNORMAL LOW (ref 1.005–1.030)
pH: 8 (ref 5.0–8.0)

## 2018-12-30 LAB — CBC WITH DIFFERENTIAL/PLATELET
Abs Immature Granulocytes: 0.02 10*3/uL (ref 0.00–0.07)
Basophils Absolute: 0 10*3/uL (ref 0.0–0.1)
Basophils Relative: 2 %
Eosinophils Absolute: 0 10*3/uL (ref 0.0–0.5)
Eosinophils Relative: 2 %
HCT: 37.3 % (ref 36.0–46.0)
Hemoglobin: 12.5 g/dL (ref 12.0–15.0)
Immature Granulocytes: 1 %
Lymphocytes Relative: 61 %
Lymphs Abs: 1 10*3/uL (ref 0.7–4.0)
MCH: 31.5 pg (ref 26.0–34.0)
MCHC: 33.5 g/dL (ref 30.0–36.0)
MCV: 94 fL (ref 80.0–100.0)
Monocytes Absolute: 0.3 10*3/uL (ref 0.1–1.0)
Monocytes Relative: 18 %
Neutro Abs: 0.3 10*3/uL — CL (ref 1.7–7.7)
Neutrophils Relative %: 16 %
Platelets: 219 10*3/uL (ref 150–400)
RBC: 3.97 MIL/uL (ref 3.87–5.11)
RDW: 12.7 % (ref 11.5–15.5)
WBC: 1.7 10*3/uL — ABNORMAL LOW (ref 4.0–10.5)
nRBC: 0 % (ref 0.0–0.2)

## 2018-12-30 LAB — COMPREHENSIVE METABOLIC PANEL
ALT: 25 U/L (ref 0–44)
AST: 28 U/L (ref 15–41)
Albumin: 3.4 g/dL — ABNORMAL LOW (ref 3.5–5.0)
Alkaline Phosphatase: 93 U/L (ref 38–126)
Anion gap: 9 (ref 5–15)
BUN: <4 mg/dL — ABNORMAL LOW (ref 6–20)
CO2: 23 mmol/L (ref 22–32)
Calcium: 9.2 mg/dL (ref 8.9–10.3)
Chloride: 104 mmol/L (ref 98–111)
Creatinine, Ser: 0.72 mg/dL (ref 0.44–1.00)
GFR calc Af Amer: >60 mL/min (ref >60)
GFR calc non Af Amer: >60 mL/min (ref >60)
Glucose, Bld: 98 mg/dL (ref 70–99)
Potassium: 4.8 mmol/L (ref 3.5–5.1)
Sodium: 136 mmol/L (ref 135–145)
Total Bilirubin: 0.4 mg/dL (ref 0.3–1.2)
Total Protein: 7.3 g/dL (ref 6.5–8.1)

## 2018-12-30 MED ORDER — CIPROFLOXACIN HCL 500 MG PO TABS: 500.0000 mg | ORAL_TABLET | Freq: Two times a day (BID) | ORAL | 0 refills | Status: DC

## 2018-12-30 MED ORDER — SODIUM CHLORIDE 0.9 % IV SOLN
INTRAVENOUS | Status: DC
  Administered 2018-12-30: 10:00:00 via INTRAVENOUS
  Filled 2018-12-30 (×2): qty 250

## 2018-12-30 MED ORDER — OXYCODONE-ACETAMINOPHEN 5-325 MG PO TABS: 1.0000 | ORAL_TABLET | Freq: Four times a day (QID) | ORAL | 0 refills | Status: DC | PRN

## 2018-12-30 MED ORDER — OXYCODONE-ACETAMINOPHEN 5-325 MG PO TABS
1.0000 | ORAL_TABLET | Freq: Once | ORAL | Status: AC
  Administered 2018-12-30: 1 via ORAL

## 2018-12-30 MED ORDER — OXYCODONE-ACETAMINOPHEN 5-325 MG PO TABS
ORAL_TABLET | ORAL | Status: AC
  Filled 2018-12-30: qty 1

## 2018-12-30 NOTE — Patient Instructions (Signed)

## 2018-12-30 NOTE — Progress Notes (Unsigned)
Pt received 1L IVF NS and oral percocet per NP Mendel Ryder.  Tolerated both well.  VSS.  Reports feeling better at end of tx.  Lower back pain resolved.  Urine sample taken to lab.  Afebrile.  Pt aware that she will receive a f/u call about her urine results and if NP Mendel Ryder will be sending in a percocet prescriptions per pt request.  Denies any other questions or concerns at time of d/c.

## 2018-12-30 NOTE — Progress Notes (Signed)
[facility]  Telephone:([phone]) [phone] Fax:(336) [phone]     ID: Alice [name] DOB: [birth year]  MR#: [id number]  STM#:[id number]  Patient Care Team: Carlena Hurl, PA-C as PCP - General (Family Medicine) Mauro Kaufmann, RN as Oncology Nurse Navigator Rockwell Germany, RN as Oncology Nurse Navigator Magrinat, Virgie Dad, MD as Consulting Physician (Oncology) Kyung Rudd, MD as Consulting Physician (Radiation Oncology) Rolm Bookbinder, MD as Consulting Physician (General Surgery) Earnstine Regal, PA-C as Consulting Physician (Obstetrics and Gynecology) Juanita Craver, MD as Consulting Physician (Gastroenterology) [name], MD as Consulting Physician (Family Medicine) Cristine Polio, MD as Consulting Physician (Plastic Surgery) Scot Dock, NP OTHER MD:    CHIEF COMPLAINT: Estrogen receptor positive breast cancer  CURRENT TREATMENT: Adjuvant chemotherapy   INTERVAL HISTORY: Alice [name] was seen today for follow up of her estrogen receptor positive breast cancer.  She is here today after starting adjuvant chemotherapy with Docetaxel and Cyclophosphamide given every 21 days x 4 cycles.  This is with Neulasta support.    REVIEW OF SYSTEMS: Alice [name] is not feeling well today.  She is tearful because she is in a lot of pain.  She notes she has had lower back pain that started when she received the injection.  She did not take the Claritin that was recommended.  She is very worried and concerned about the pain.  She notes that she is drinking about 2-3 bottles of water per day and that her urine is more concentrated than it normally is.  She also has increased urinary frequency.    Alice [name] denies fever, chills, chest pain, palpitations, cough, shortness of breath.  She has no nausea, vomiting, bowel/bladder changes.  She has no skin changes.  A detailed ROS was otherwise non contributory.    HISTORY OF CURRENT ILLNESS: From the original intake note:   Alice [name] has a history of remote right breast cancer in 1997, treated with right lumpectomy and radiation in [place].  The patient did not receive chemotherapy or antiestrogen treatments.  More recently she presented for her annual mammogram with a non-tender area of firmness in the right breast at 9 o'clock for 2 months. She underwent bilateral diagnostic mammography with tomography and right breast ultrasonography at [facility] on 09/08/2018 showing: breast density category A; scattered-appearing fibroglandular tissue at the lumpectomy site in the upper outer right breast middle depth spanning 3.4 cm, with indistinct margins, was not seen on prior mammogram from 2018.  There was also a keloid scar in the inferior medial left breast.  On physical exam there was a rectangular area of hardness measuring up to 7 cm in the right breast at 9:00, corresponding with the prior lumpectomy and consistent with radiation change.  Ultrasound of this area showed only postlumpectomy change, with no suspicious or discrete mass.  Ultrasound of the axilla was benign  Accordingly on 09/23/2018 she proceeded to biopsy of the right breast area in question. The pathology from this procedure SAA 20-07/02/2000 showed: invasive ductal carcinoma, E-cadherin positive,  grade 3,  Prognostic indicators significant for: estrogen receptor at 100% positive and progesterone receptor, 30% positive, both with strong staining intensity proliferation marker Ki67 at 70%. HER2 negative by immunohistochemistry (0).  The patient's subsequent history is as detailed below.   PAST MEDICAL HISTORY: Past Medical History:  Diagnosis Date  . Anemia   . Breast cancer ([place])    right in 1997  . Family history of lung cancer   . Family history of ovarian cancer   .  GERD (gastroesophageal reflux disease) 06/30/2018  . Herpes simplex type 2 infection 02/2006  . History of breast cancer 1997  . History of esophagogastroduodenoscopy  (EGD) 09/15/2015   small hiatal hernia was noted otherwise all was normal  . Personal history of breast cancer     PAST SURGICAL HISTORY: Past Surgical History:  Procedure Laterality Date  . BREAST ENHANCEMENT SURGERY     s/p cancer and surgery, right  . BREAST LUMPECTOMY     right due to breast cancer  . MASTECTOMY W/ SENTINEL NODE BIOPSY Right 10/22/2018   Procedure: RIGHT MASTECTOMY WITH RIGHT AXILLARY SENTINEL LYMPH NODE BIOPSY;  Surgeon: Rolm Bookbinder, MD;  Location: [facility];  Service: General;  Laterality: Right;  wisdom teeth removal   FAMILY HISTORY: Family History  Problem Relation Age of Onset  . Ovarian cancer Sister 44  . Dementia Mother   . Aneurysm Father    Patient's father was 60 years old when he died from aneurysm. Patient's mother died from dementia at age 55. Her sister was diagnosed with ovarian cancer at age 83, and this subsequently took her life.  In total of the patient has 5 brothers and 2 sisters.  She is not aware of other cancers in the family  GYNECOLOGIC HISTORY:  No LMP recorded. Patient is postmenopausal. Menarche: 58 years old [place] P 0 LMP age 61 Contraceptive none HRT none  Hysterectomy? no BSO? 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 She is married. Husband Edmonia Caprio is from [place], so his native language is [place]. She lives at home with her husband. They have no children. She attends a [employer] in [place].    ADVANCED DIRECTIVES: In the absence of documents to the contrary her husband is automatically her HCPOA.   HEALTH MAINTENANCE: Social History   Tobacco Use  . Smoking status: Never Smoker  . Smokeless tobacco: Never Used  Substance Use Topics  . Alcohol use: No    Alcohol/week: 0.0 standard drinks  . Drug use: No     Colonoscopy: endoscopy in 2017, colonoscopy due 2020 but delayed due to virus  PAP: 12/03/2017, normal   Bone density: never done   No Known Allergies  Current Outpatient Medications  Medication Sig Dispense Refill  . anastrozole (ARIMIDEX) 1 MG tablet Take 1 tablet (1 mg total) by mouth daily. 90 tablet 4  . ciprofloxacin (CIPRO) 500 MG tablet Take 1 tablet (500 mg total) by mouth 2 (two) times daily. 10 tablet 0  . dexamethasone (DECADRON) 4 MG tablet Take 2 tablets (8 mg total) by mouth 2 (two) times daily. Start the day before Taxotere. Then again the day after chemo for 3 days. 30 tablet 1  . lidocaine-prilocaine (EMLA) cream Apply to affected area once 30 g 3  . LORazepam (ATIVAN) 0.5 MG tablet Take 1 tablet (0.5 mg total) by mouth at bedtime as needed (Nausea or vomiting). 30 tablet 0  . oxyCODONE (OXY IR/ROXICODONE) 5 MG immediate release tablet Take 1-2 tablets (5-10 mg total) by mouth every 4 (four) hours as needed for moderate pain. 10 tablet 0  . oxyCODONE-acetaminophen (PERCOCET/ROXICET) 5-325 MG tablet Take 1 tablet by mouth every 6 (six) hours as needed for severe pain. 15 tablet 0  . prochlorperazine (COMPAZINE) 10 MG tablet Take 1 tablet (10 mg total) by mouth every 6 (six) hours as needed (Nausea or vomiting). 30 tablet 1   Current Facility-Administered  Medications  Medication Dose Route Frequency Provider Last Rate Last Dose  . 0.9 %  sodium chloride infusion   Intravenous Continuous Gardenia Phlegm, NP   Stopped at 12/30/18 1208    OBJECTIVE:  BP 104/70 (BP Location: Left Arm, Patient Position: Sitting)   Pulse (!) 105   Temp 99.1 F (37.3 C) (Oral)   Resp 18   Ht '5\' 4"'$  (1.626 m)   Wt 178 lb 8 oz (81 kg)   SpO2 100%   BMI 30.64 kg/m  Filed Weights   12/30/18 0844  Weight: 178 lb 8 oz (81 kg)   ECOG: 0 GENERAL: Patient is tearful and in obvious discomfort HEENT:  Sclerae anicteric.  Oropharynx clear and moist. No ulcerations or evidence of oropharyngeal candidiasis. Neck is supple.  NODES:  No cervical, supraclavicular, or axillary lymphadenopathy  palpated.  BREAST EXAM: deferred. LUNGS:  Clear to auscultation bilaterally.  No wheezes or rhonchi. HEART:  Regular rate and rhythm. No murmur appreciated. ABDOMEN:  Soft, nontender.  Positive, normoactive bowel sounds. No organomegaly palpated. MSK:  No focal spinal tenderness to palpation. Full range of motion bilaterally in the upper extremities. EXTREMITIES:  No peripheral edema.   SKIN:  Clear with no obvious rashes or skin changes. No nail dyscrasia. NEURO:  Nonfocal. Well oriented.  Anxious    LAB RESULTS:  CMP     Component Value Date/Time   NA 136 12/30/2018 0818   K 4.8 12/30/2018 0818   CL 104 12/30/2018 0818   CO2 23 12/30/2018 0818   GLUCOSE 98 12/30/2018 0818   BUN <4 (L) 12/30/2018 0818   CREATININE 0.72 12/30/2018 0818   CREATININE 0.73 09/30/2018 0819   CALCIUM 9.2 12/30/2018 0818   PROT 7.3 12/30/2018 0818   ALBUMIN 3.4 (L) 12/30/2018 0818   AST 28 12/30/2018 0818   AST 20 09/30/2018 0819   ALT 25 12/30/2018 0818   ALT 6 09/30/2018 0819   ALKPHOS 93 12/30/2018 0818   BILITOT 0.4 12/30/2018 0818   BILITOT 0.5 09/30/2018 0819   GFRNONAA >60 12/30/2018 0818   GFRNONAA >60 09/30/2018 0819   GFRAA >60 12/30/2018 0818   GFRAA >60 09/30/2018 0819    No results found for: TOTALPROTELP, ALBUMINELP, A1GS, A2GS, BETS, BETA2SER, GAMS, MSPIKE, SPEI  No results found for: KPAFRELGTCHN, LAMBDASER, KAPLAMBRATIO  Lab Results  Component Value Date   WBC 1.7 (L) 12/30/2018   NEUTROABS 0.3 (LL) 12/30/2018   HGB 12.5 12/30/2018   HCT 37.3 12/30/2018   MCV 94.0 12/30/2018   PLT 219 12/30/2018    '@LASTCHEMISTRY'$ @  No results found for: LABCA2  No components found for: [id number]  No results for input(s): INR in the last 168 hours.  No results found for: LABCA2  No results found for: LKG401  No results found for: UUV253  No results found for: GUY403  No results found for: CA2729  No components found for: HGQUANT  No results found for: CEA1 / No  results found for: CEA1   No results found for: AFPTUMOR  No results found for: CHROMOGRNA  No results found for: PSA1  Appointment on 12/30/2018  Component Date Value Ref Range Status  . Sodium 12/30/2018 136  135 - 145 mmol/L Final  . Potassium 12/30/2018 4.8  3.5 - 5.1 mmol/L Final  . Chloride 12/30/2018 104  98 - 111 mmol/L Final  . CO2 12/30/2018 23  22 - 32 mmol/L Final  . Glucose, Bld 12/30/2018 98  70 - 99 mg/dL Final  .  BUN 12/30/2018 <4* 6 - 20 mg/dL Final  . Creatinine, Ser 12/30/2018 0.72  0.44 - 1.00 mg/dL Final  . Calcium 12/30/2018 9.2  8.9 - 10.3 mg/dL Final  . Total Protein 12/30/2018 7.3  6.5 - 8.1 g/dL Final  . Albumin 12/30/2018 3.4* 3.5 - 5.0 g/dL Final  . AST 12/30/2018 28  15 - 41 U/L Final  . ALT 12/30/2018 25  0 - 44 U/L Final  . Alkaline Phosphatase 12/30/2018 93  38 - 126 U/L Final  . Total Bilirubin 12/30/2018 0.4  0.3 - 1.2 mg/dL Final  . GFR calc non Af Amer 12/30/2018 >60  >60 mL/min Final  . GFR calc Af Amer 12/30/2018 >60  >60 mL/min Final  . Anion gap 12/30/2018 9  5 - 15 Final   Performed at [facility], [place] [address], [place], [place] [address]  . WBC 12/30/2018 1.7* 4.0 - 10.5 K/uL Final  . RBC 12/30/2018 3.97  3.87 - 5.11 MIL/uL Final  . Hemoglobin 12/30/2018 12.5  12.0 - 15.0 g/dL Final  . HCT 12/30/2018 37.3  36.0 - 46.0 % Final  . MCV 12/30/2018 94.0  80.0 - 100.0 fL Final  . MCH 12/30/2018 31.5  26.0 - 34.0 pg Final  . MCHC 12/30/2018 33.5  30.0 - 36.0 g/dL Final  . RDW 12/30/2018 12.7  11.5 - 15.5 % Final  . Platelets 12/30/2018 219  150 - 400 K/uL Final  . nRBC 12/30/2018 0.0  0.0 - 0.2 % Final  . Neutrophils Relative % 12/30/2018 16  % Final  . Neutro Abs 12/30/2018 0.3* 1.7 - 7.7 K/uL Final   This critical result has verified and been called to RN VALAREI DUNN _0 .59AM. KU by Julian Hy on 09 02 2020 at 3614, and has been read back.   . Lymphocytes Relative 12/30/2018 61  % Final  . Lymphs Abs  12/30/2018 1.0  0.7 - 4.0 K/uL Final  . Monocytes Relative 12/30/2018 18  % Final  . Monocytes Absolute 12/30/2018 0.3  0.1 - 1.0 K/uL Final  . Eosinophils Relative 12/30/2018 2  % Final  . Eosinophils Absolute 12/30/2018 0.0  0.0 - 0.5 K/uL Final  . Basophils Relative 12/30/2018 2  % Final  . Basophils Absolute 12/30/2018 0.0  0.0 - 0.1 K/uL Final  . Immature Granulocytes 12/30/2018 1  % Final  . Abs Immature Granulocytes 12/30/2018 0.02  0.00 - 0.07 K/uL Final   Performed at [facility], [place] [address], [place], [place] [address]  . Color, Urine 12/30/2018 YELLOW  YELLOW Final  . APPearance 12/30/2018 CLEAR  CLEAR Final  . Specific Gravity, Urine 12/30/2018 1.004* 1.005 - 1.030 Final  . pH 12/30/2018 8.0  5.0 - 8.0 Final  . Glucose, UA 12/30/2018 NEGATIVE  NEGATIVE mg/dL Final  . Hgb urine dipstick 12/30/2018 NEGATIVE  NEGATIVE Final  . Bilirubin Urine 12/30/2018 NEGATIVE  NEGATIVE Final  . Ketones, ur 12/30/2018 NEGATIVE  NEGATIVE mg/dL Final  . Protein, ur 12/30/2018 NEGATIVE  NEGATIVE mg/dL Final  . Nitrite 12/30/2018 NEGATIVE  NEGATIVE Final  . Leukocytes,Ua 12/30/2018 NEGATIVE  NEGATIVE Final  . RBC / HPF 12/30/2018 0-5  0 - 5 RBC/hpf Final  . WBC, UA 12/30/2018 0-5  0 - 5 WBC/hpf Final  . Bacteria, UA 12/30/2018 NONE SEEN  NONE SEEN Final  . Squamous Epithelial / LPF 12/30/2018 0-5  0 - 5 Final   Performed at [facility], [address] [place],  [place] [address]    (this displays the last labs from the last 3 days)  No results found for: TOTALPROTELP, ALBUMINELP, A1GS, A2GS, BETS, BETA2SER, GAMS, MSPIKE, SPEI (this displays SPEP labs)  No results found for: KPAFRELGTCHN, LAMBDASER, KAPLAMBRATIO (kappa/lambda light chains)  No results found for: HGBA, HGBA2QUANT, HGBFQUANT, HGBSQUAN (Hemoglobinopathy evaluation)   No results found for: LDH  No results found for: IRON, TIBC, IRONPCTSAT (Iron and TIBC)  No results found  for: FERRITIN  Urinalysis    Component Value Date/Time   COLORURINE YELLOW 12/30/2018 0948   APPEARANCEUR CLEAR 12/30/2018 0948   LABSPEC 1.004 (L) 12/30/2018 0948   PHURINE 8.0 12/30/2018 [place] 12/30/2018 0948   HGBUR NEGATIVE 12/30/2018 [place] 12/30/2018 0948   KETONESUR NEGATIVE 12/30/2018 0948   PROTEINUR NEGATIVE 12/30/2018 0948   NITRITE NEGATIVE 12/30/2018 0948   LEUKOCYTESUR NEGATIVE 12/30/2018 0948     STUDIES: No results found.  ELIGIBLE FOR AVAILABLE RESEARCH PROTOCOL: no  ASSESSMENT: 58 y.o.  woman   (1) status post right lumpectomy in 1997,  (a) status post adjuvant radiation  (b) did not receive antiestrogens or chemotherapy  (2) status post right breast upper outer quadrant biopsy 09/23/2018 for a clinical T2 N0, stage IIA invasive ductal carcinoma, grade 3, estrogen and progesterone receptor positive, HER-2 not amplified, with an MIB-1 of 70%  (3) Oncotype obtained from the initial biopsy showed a score of 26 predicting a risk of recurrence outside the breast of 16/21% (depending on nodal status), if the patient's only systemic therapy is an antiestrogen for 5 years.  It also predicts a significant benefit from chemotherapy  (4) status post right mastectomy and sentinel lymph node sampling 10/22/2018 for a pT2 pN0, stage IB invasive ductal carcinoma, grade 2, with negative margins  (a) not planning reconstruction at this point  (5) adjuvant chemotherapy recommended on 11/05/2018 by Dr. Jana Hakim  (a) second opinion with Dr. Annabell Sabal at [facility] on 11/18/2018 recommended 4 cycles of adjuvant docetaxel and cyclophosphamide   (b) Third opinion with Dr. Adan Sis on 11/24/2018 recommended 4 cycles of adjuvant Docetaxel and Cyclophosphamide  (c) To start adjuvant chemotherapy with four cycles of Docetaxel and Cyclophosphamide on 12/23/2018  (6) anastrozole started 09/30/2018 (patient stopped due to concerns over bone  loss--to restart once adjuvant therapy has completed)  (a) Bone density on 12/10/2018 shows a T score of -3.8 in the AP spine.    (7) genetics testing 10/06/2018 through the [facility] Common Hereditary Cancers Panel found no deleterious mutations in APC, ATM, AXIN2, BARD1, BMPR1A, BRCA1, BRCA2, BRIP1, CDH1, CDKN2A (p14ARF), CDKN2A (p16INK4a), CKD4, CHEK2, CTNNA1, DICER1, EPCAM (Deletion/duplication testing only), GREM1 (promoter region deletion/duplication testing only), KIT, MEN1, MLH1, MSH2, MSH3, MSH6, MUTYH, NBN, NF1, NHTL1, PALB2, PDGFRA, PMS2, POLD1, POLE, PTEN, RAD50, RAD51C, RAD51D, SDHB, SDHC, SDHD, SMAD4, SMARCA4. STK11, TP53, TSC1, TSC2, and VHL. The following genes were evaluated for sequence changes only: SDHA and HOXB13 c.251G>A variant only.   (a) 2 VUS's identified: VUS in APC c.-34014C>T and VHL c.340+272T>G identified   PLAN: Tressa completed her first cycle of chemotherapy with Docetaxel and Cyclophosphamide with Neulasta support.  She is in a lot of pain today. She is tearful and anxious about this pain and what it will mean for the long term and if it will ever go away.  I gave Alice [name] a lot of reassurance today, and let her know that we will work with her in the future to help prevent things like this from  getting to this point.  She is likely dehydrated, and I have ordered for her to receive one liter of normal saline.  She also needs some pain medication.  We have percocet in the pyxis, so we will see if that helps with her severe lower back pain.  If so, then I will send in a small script to take when she experiences the back pain related to the Neulasta.  I will also get a urinalysis.  Her labs aren't entirely back yet, but we will keep an eye out for them, and follow up if necessary.  Xitlalli will return in 2 weeks for labs, f/u, and cycle 2 of her treatment.  She was recommended to continue with the appropriate pandemic precautions. She knows to call for any questions that may  arise between now and her next appointment.  We are happy to see her sooner if needed.  A total of (30) minutes of face-to-face time was spent with this patient with greater than 50% of that time in counseling and care-coordination.   Alice Bihari, NP   12/30/2018 3:39 PM Medical Oncology and Hematology [facility] [address] [place], [place] [address] Tel. [phone]    Fax. [phone]

## 2018-12-31 LAB — URINE CULTURE: Culture: NO GROWTH

## 2019-01-11 ENCOUNTER — Ambulatory Visit: Payer: BC Managed Care – PPO | Attending: General Surgery

## 2019-01-13 ENCOUNTER — Inpatient Hospital Stay (HOSPITAL_BASED_OUTPATIENT_CLINIC_OR_DEPARTMENT_OTHER): Payer: BC Managed Care – PPO | Admitting: Adult Health

## 2019-01-13 ENCOUNTER — Encounter: Payer: Self-pay | Admitting: Oncology

## 2019-01-13 ENCOUNTER — Inpatient Hospital Stay: Payer: BC Managed Care – PPO

## 2019-01-13 ENCOUNTER — Encounter: Payer: Self-pay | Admitting: Adult Health

## 2019-01-13 ENCOUNTER — Other Ambulatory Visit: Payer: Self-pay

## 2019-01-13 VITALS — BP 111/76 | HR 89 | Temp 98.7°F | Resp 17 | Ht 64.0 in | Wt 177.9 lb

## 2019-01-13 DIAGNOSIS — D709 Neutropenia, unspecified: Secondary | ICD-10-CM | POA: Diagnosis not present

## 2019-01-13 DIAGNOSIS — K219 Gastro-esophageal reflux disease without esophagitis: Secondary | ICD-10-CM | POA: Diagnosis not present

## 2019-01-13 DIAGNOSIS — E860 Dehydration: Secondary | ICD-10-CM | POA: Diagnosis not present

## 2019-01-13 DIAGNOSIS — C50411 Malignant neoplasm of upper-outer quadrant of right female breast: Secondary | ICD-10-CM

## 2019-01-13 DIAGNOSIS — R21 Rash and other nonspecific skin eruption: Secondary | ICD-10-CM | POA: Diagnosis not present

## 2019-01-13 DIAGNOSIS — Z5111 Encounter for antineoplastic chemotherapy: Secondary | ICD-10-CM | POA: Diagnosis not present

## 2019-01-13 DIAGNOSIS — Z170 Estrogen receptor positive status [ER+]: Secondary | ICD-10-CM

## 2019-01-13 DIAGNOSIS — R1012 Left upper quadrant pain: Secondary | ICD-10-CM | POA: Diagnosis not present

## 2019-01-13 DIAGNOSIS — Z79899 Other long term (current) drug therapy: Secondary | ICD-10-CM | POA: Diagnosis not present

## 2019-01-13 DIAGNOSIS — Z79811 Long term (current) use of aromatase inhibitors: Secondary | ICD-10-CM | POA: Diagnosis not present

## 2019-01-13 DIAGNOSIS — Z5189 Encounter for other specified aftercare: Secondary | ICD-10-CM | POA: Diagnosis not present

## 2019-01-13 LAB — CBC WITH DIFFERENTIAL/PLATELET
Abs Immature Granulocytes: 0.02 10*3/uL (ref 0.00–0.07)
Basophils Absolute: 0 10*3/uL (ref 0.0–0.1)
Basophils Relative: 1 %
Eosinophils Absolute: 0 10*3/uL (ref 0.0–0.5)
Eosinophils Relative: 0 %
HCT: 35.7 % — ABNORMAL LOW (ref 36.0–46.0)
Hemoglobin: 11.7 g/dL — ABNORMAL LOW (ref 12.0–15.0)
Immature Granulocytes: 0 %
Lymphocytes Relative: 22 %
Lymphs Abs: 1.2 10*3/uL (ref 0.7–4.0)
MCH: 31.4 pg (ref 26.0–34.0)
MCHC: 32.8 g/dL (ref 30.0–36.0)
MCV: 95.7 fL (ref 80.0–100.0)
Monocytes Absolute: 0.5 10*3/uL (ref 0.1–1.0)
Monocytes Relative: 8 %
Neutro Abs: 4 10*3/uL (ref 1.7–7.7)
Neutrophils Relative %: 69 %
Platelets: 386 10*3/uL (ref 150–400)
RBC: 3.73 MIL/uL — ABNORMAL LOW (ref 3.87–5.11)
RDW: 14.2 % (ref 11.5–15.5)
WBC: 5.8 10*3/uL (ref 4.0–10.5)
nRBC: 0 % (ref 0.0–0.2)

## 2019-01-13 LAB — COMPREHENSIVE METABOLIC PANEL
ALT: 7 U/L (ref 0–44)
AST: 22 U/L (ref 15–41)
Albumin: 3.4 g/dL — ABNORMAL LOW (ref 3.5–5.0)
Alkaline Phosphatase: 94 U/L (ref 38–126)
Anion gap: 7 (ref 5–15)
BUN: <4 mg/dL — ABNORMAL LOW (ref 6–20)
CO2: 22 mmol/L (ref 22–32)
Calcium: 8.8 mg/dL — ABNORMAL LOW (ref 8.9–10.3)
Chloride: 111 mmol/L (ref 98–111)
Creatinine, Ser: 0.63 mg/dL (ref 0.44–1.00)
GFR calc Af Amer: >60 mL/min (ref >60)
GFR calc non Af Amer: >60 mL/min (ref >60)
Glucose, Bld: 85 mg/dL (ref 70–99)
Potassium: 4.3 mmol/L (ref 3.5–5.1)
Sodium: 140 mmol/L (ref 135–145)
Total Bilirubin: 0.4 mg/dL (ref 0.3–1.2)
Total Protein: 6.6 g/dL (ref 6.5–8.1)

## 2019-01-13 MED ORDER — SODIUM CHLORIDE 0.9 % IV SOLN
600.0000 mg/m2 | Freq: Once | INTRAVENOUS | Status: AC
  Administered 2019-01-13: 1180 mg via INTRAVENOUS
  Filled 2019-01-13: qty 59

## 2019-01-13 MED ORDER — SODIUM CHLORIDE 0.9 % IV SOLN
75.0000 mg/m2 | Freq: Once | INTRAVENOUS | Status: AC
  Administered 2019-01-13: 150 mg via INTRAVENOUS
  Filled 2019-01-13: qty 15

## 2019-01-13 MED ORDER — SODIUM CHLORIDE 0.9 % IV SOLN
Freq: Once | INTRAVENOUS | Status: AC
  Administered 2019-01-13: 10:00:00 via INTRAVENOUS
  Filled 2019-01-13: qty 250

## 2019-01-13 MED ORDER — DEXAMETHASONE SODIUM PHOSPHATE 10 MG/ML IJ SOLN
10.0000 mg | Freq: Once | INTRAMUSCULAR | Status: AC
  Administered 2019-01-13: 10 mg via INTRAVENOUS

## 2019-01-13 MED ORDER — PALONOSETRON HCL INJECTION 0.25 MG/5ML
INTRAVENOUS | Status: AC
  Filled 2019-01-13: qty 5

## 2019-01-13 MED ORDER — DEXAMETHASONE SODIUM PHOSPHATE 10 MG/ML IJ SOLN
INTRAMUSCULAR | Status: AC
  Filled 2019-01-13: qty 1

## 2019-01-13 MED ORDER — PALONOSETRON HCL INJECTION 0.25 MG/5ML
0.2500 mg | Freq: Once | INTRAVENOUS | Status: AC
  Administered 2019-01-13: 11:00:00 0.25 mg via INTRAVENOUS

## 2019-01-13 NOTE — Progress Notes (Signed)
[place]  [phone] [phone]     ID: [name] DOB: 1961/04/03  MR#: [id number]  NFA#:[id number]  Patient Care Team: [name], MD as PCP - General (Family Medicine) Mauro Kaufmann, RN as Oncology Nurse Navigator Rockwell Germany, RN as Oncology Nurse Navigator Magrinat, Virgie Dad, MD as Consulting Physician (Oncology) Kyung Rudd, MD as Consulting Physician (Radiation Oncology) Rolm Bookbinder, MD as Consulting Physician (General Surgery) Earnstine Regal, PA-C as Consulting Physician (Obstetrics and Gynecology) Juanita Craver, MD as Consulting Physician (Gastroenterology) [name], MD as Consulting Physician (Family Medicine) Cristine Polio, MD as Consulting Physician (Plastic Surgery) Scot Dock, NP OTHER MD:    CHIEF COMPLAINT: Estrogen receptor positive breast cancer  CURRENT TREATMENT: Adjuvant chemotherapy   INTERVAL HISTORY: Alice [name] was seen today for follow up of her estrogen receptor positive breast cancer.  She is here today after starting adjuvant chemotherapy with Docetaxel and Cyclophosphamide given every 21 days x 4 cycles.  This is with Neulasta support.   Today is cycle 2 day 1.    After her last cycle, she had significant back pain.  This was relieved with percocet and IV fluids she received on day 8. She has been doing well since.  REVIEW OF SYSTEMS: Alice [name] is doing well today.  She has noted her nails are darkening at the base of the first digit.  She has no peripheral neuropathy.  She has some lower back aching that happens from time to time, but nothing persistent.  She continues to partake in healthy diet, focusing on greens and vegetables, and drinking plenty of water.    Alice [name] is without fever, chills, chest pain, palpitations, cough, shortness of breath.  She has no nausea, vomiting, bowel/bladder changes.  A detailed ROS was otherwise non contributory.    HISTORY OF CURRENT  ILLNESS: From the original intake note:  Alice [name] has a history of remote right breast cancer in 1997, treated with right lumpectomy and radiation in [place].  The patient did not receive chemotherapy or antiestrogen treatments.  More recently she presented for her annual mammogram with a non-tender area of firmness in the right breast at 9 o'clock for 2 months. She underwent bilateral diagnostic mammography with tomography and right breast ultrasonography at [facility] on 09/08/2018 showing: breast density category A; scattered-appearing fibroglandular tissue at the lumpectomy site in the upper outer right breast middle depth spanning 3.4 cm, with indistinct margins, was not seen on prior mammogram from 2018.  There was also a keloid scar in the inferior medial left breast.  On physical exam there was a rectangular area of hardness measuring up to 7 cm in the right breast at 9:00, corresponding with the prior lumpectomy and consistent with radiation change.  Ultrasound of this area showed only postlumpectomy change, with no suspicious or discrete mass.  Ultrasound of the axilla was benign  Accordingly on 09/23/2018 she proceeded to biopsy of the right breast area in question. The pathology from this procedure [facility] 20-07/02/2000 showed: invasive ductal carcinoma, E-cadherin positive,  grade 3,  Prognostic indicators significant for: estrogen receptor at 100% positive and progesterone receptor, 30% positive, both with strong staining intensity proliferation marker Ki67 at 70%. HER2 negative by immunohistochemistry (0).  The patient's subsequent history is as detailed below.   PAST MEDICAL HISTORY: Past Medical History:  Diagnosis Date  . Anemia   . Breast cancer ([place])    right in 1997  . Family history of lung cancer   .  Family history of ovarian cancer   . GERD (gastroesophageal reflux disease) 06/30/2018  . Herpes simplex type 2 infection 02/2006  . History of breast cancer 1997   . History of esophagogastroduodenoscopy (EGD) 09/15/2015   small hiatal hernia was noted otherwise all was normal  . Personal history of breast cancer     PAST SURGICAL HISTORY: Past Surgical History:  Procedure Laterality Date  . BREAST ENHANCEMENT SURGERY     s/p cancer and surgery, right  . BREAST LUMPECTOMY     right due to breast cancer  . MASTECTOMY W/ SENTINEL NODE BIOPSY Right 10/22/2018   Procedure: RIGHT MASTECTOMY WITH RIGHT AXILLARY SENTINEL LYMPH NODE BIOPSY;  Surgeon: Rolm Bookbinder, MD;  Location: [facility];  Service: General;  Laterality: Right;  wisdom teeth removal   FAMILY HISTORY: Family History  Problem Relation Age of Onset  . Ovarian cancer Sister 58  . Dementia Mother   . Aneurysm Father    Patient's father was 48 years old when he died from aneurysm. Patient's mother died from dementia at age 53. Her sister was diagnosed with ovarian cancer at age 18, and this subsequently took her life.  In total of the patient has 5 brothers and 2 sisters.  She is not aware of other cancers in the family  GYNECOLOGIC HISTORY:  No LMP recorded. Patient is postmenopausal. Menarche: 58 years old [place] P 0 LMP age 43 Contraceptive none HRT none  Hysterectomy? no BSO? 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 She is married. Husband Edmonia Caprio is from [place], so his native language is [place]. She lives at home with her husband. They have no children. She attends a [employer] in [place].    ADVANCED DIRECTIVES: In the absence of documents to the contrary her husband is automatically her HCPOA.   HEALTH MAINTENANCE: Social History   Tobacco Use  . Smoking status: Never Smoker  . Smokeless tobacco: Never Used  Substance Use Topics  . Alcohol use: No    Alcohol/week: 0.0 standard drinks  . Drug use: No     Colonoscopy: endoscopy in 2017, colonoscopy due 2020 but delayed  due to virus  PAP: 12/03/2017, normal  Bone density: never done   No Known Allergies  Current Outpatient Medications  Medication Sig Dispense Refill  . anastrozole (ARIMIDEX) 1 MG tablet Take 1 tablet (1 mg total) by mouth daily. 90 tablet 4  . ciprofloxacin (CIPRO) 500 MG tablet Take 1 tablet (500 mg total) by mouth 2 (two) times daily. 10 tablet 0  . dexamethasone (DECADRON) 4 MG tablet Take 2 tablets (8 mg total) by mouth 2 (two) times daily. Start the day before Taxotere. Then again the day after chemo for 3 days. 30 tablet 1  . lidocaine-prilocaine (EMLA) cream Apply to affected area once 30 g 3  . LORazepam (ATIVAN) 0.5 MG tablet Take 1 tablet (0.5 mg total) by mouth at bedtime as needed (Nausea or vomiting). 30 tablet 0  . oxyCODONE (OXY IR/ROXICODONE) 5 MG immediate release tablet Take 1-2 tablets (5-10 mg total) by mouth every 4 (four) hours as needed for moderate pain. 10 tablet 0  . oxyCODONE-acetaminophen (PERCOCET/ROXICET) 5-325 MG tablet Take 1 tablet by mouth every 6 (six) hours as needed for severe pain. 15 tablet 0  . prochlorperazine (COMPAZINE) 10 MG tablet Take 1 tablet (10 mg total) by mouth every 6 (six) hours as needed (Nausea or  vomiting). 30 tablet 1   No current facility-administered medications for this visit.     OBJECTIVE:  BP 111/76 (BP Location: Left Arm, Patient Position: Sitting)   Pulse 89   Temp 98.7 F (37.1 C) (Temporal)   Resp 17   Ht _0  (1.626 m)   Wt 177 lb 14.4 oz (80.7 kg)   SpO2 100%   BMI 30.54 kg/m  Filed Weights   01/13/19 0837  Weight: 177 lb 14.4 oz (80.7 kg)   ECOG: 1 GENERAL: Patient appears well and she is in no apparent distress HEENT:  Sclerae anicteric.  Oropharynx clear and moist. No ulcerations or evidence of oropharyngeal candidiasis. Neck is supple.  NODES:  No cervical, supraclavicular, or axillary lymphadenopathy palpated.  BREAST EXAM: deferred. LUNGS:  Clear to auscultation bilaterally.  No wheezes or rhonchi.  HEART:  Regular rate and rhythm. No murmur appreciated. ABDOMEN:  Soft, nontender.  Positive, normoactive bowel sounds. No organomegaly palpated. MSK:  No focal spinal tenderness to palpation. Full range of motion bilaterally in the upper extremities. EXTREMITIES:  No peripheral edema.   SKIN:  Clear with no obvious rashes or skin changes. No nail dyscrasia. NEURO:  Nonfocal. Well oriented.  Anxious    LAB RESULTS:  CMP     Component Value Date/Time   NA 136 12/30/2018 0818   K 4.8 12/30/2018 0818   CL 104 12/30/2018 0818   CO2 23 12/30/2018 0818   GLUCOSE 98 12/30/2018 0818   BUN <4 (L) 12/30/2018 0818   CREATININE 0.72 12/30/2018 0818   CREATININE 0.73 09/30/2018 0819   CALCIUM 9.2 12/30/2018 0818   PROT 7.3 12/30/2018 0818   ALBUMIN 3.4 (L) 12/30/2018 0818   AST 28 12/30/2018 0818   AST 20 09/30/2018 0819   ALT 25 12/30/2018 0818   ALT 6 09/30/2018 0819   ALKPHOS 93 12/30/2018 0818   BILITOT 0.4 12/30/2018 0818   BILITOT 0.5 09/30/2018 0819   GFRNONAA >60 12/30/2018 0818   GFRNONAA >60 09/30/2018 0819   GFRAA >60 12/30/2018 0818   GFRAA >60 09/30/2018 0819    No results found for: TOTALPROTELP, ALBUMINELP, A1GS, A2GS, BETS, BETA2SER, GAMS, MSPIKE, SPEI  No results found for: KPAFRELGTCHN, LAMBDASER, KAPLAMBRATIO  Lab Results  Component Value Date   WBC 1.7 (L) 12/30/2018   NEUTROABS 0.3 (LL) 12/30/2018   HGB 12.5 12/30/2018   HCT 37.3 12/30/2018   MCV 94.0 12/30/2018   PLT 219 12/30/2018    _1 @  No results found for: LABCA2  No components found for: KKXFGH829  No results for input(s): INR in the last 168 hours.  No results found for: LABCA2  No results found for: HBZ169  No results found for: CVE938  No results found for: BOF751  No results found for: CA2729  No components found for: HGQUANT  No results found for: CEA1 / No results found for: CEA1   No results found for: AFPTUMOR  No results found for: CHROMOGRNA  No  results found for: PSA1  No visits with results within 3 Day(s) from this visit.  Latest known visit with results is:  Appointment on 12/30/2018  Component Date Value Ref Range Status  . Sodium 12/30/2018 136  135 - 145 mmol/L Final  . Potassium 12/30/2018 4.8  3.5 - 5.1 mmol/L Final  . Chloride 12/30/2018 104  98 - 111 mmol/L Final  . CO2 12/30/2018 23  22 - 32 mmol/L Final  . Glucose, Bld 12/30/2018 98  70 - 99 mg/dL Final  .  BUN 12/30/2018 <4* 6 - 20 mg/dL Final  . Creatinine, Ser 12/30/2018 0.72  0.44 - 1.00 mg/dL Final  . Calcium 12/30/2018 9.2  8.9 - 10.3 mg/dL Final  . Total Protein 12/30/2018 7.3  6.5 - 8.1 g/dL Final  . Albumin 12/30/2018 3.4* 3.5 - 5.0 g/dL Final  . AST 12/30/2018 28  15 - 41 U/L Final  . ALT 12/30/2018 25  0 - 44 U/L Final  . Alkaline Phosphatase 12/30/2018 93  38 - 126 U/L Final  . Total Bilirubin 12/30/2018 0.4  0.3 - 1.2 mg/dL Final  . GFR calc non Af Amer 12/30/2018 >60  >60 mL/min Final  . GFR calc Af Amer 12/30/2018 >60  >60 mL/min Final  . Anion gap 12/30/2018 9  5 - 15 Final   Performed at [facility], [place] [address], [place], [place] [address]  . WBC 12/30/2018 1.7* 4.0 - 10.5 K/uL Final  . RBC 12/30/2018 3.97  3.87 - 5.11 MIL/uL Final  . Hemoglobin 12/30/2018 12.5  12.0 - 15.0 g/dL Final  . HCT 12/30/2018 37.3  36.0 - 46.0 % Final  . MCV 12/30/2018 94.0  80.0 - 100.0 fL Final  . MCH 12/30/2018 31.5  26.0 - 34.0 pg Final  . MCHC 12/30/2018 33.5  30.0 - 36.0 g/dL Final  . RDW 12/30/2018 12.7  11.5 - 15.5 % Final  . Platelets 12/30/2018 219  150 - 400 K/uL Final  . nRBC 12/30/2018 0.0  0.0 - 0.2 % Final  . Neutrophils Relative % 12/30/2018 16  % Final  . Neutro Abs 12/30/2018 0.3* 1.7 - 7.7 K/uL Final   This critical result has verified and been called to RN VALAREI DUNN _0 .59AM. KU by Julian Hy on 09 02 2020 at 3614, and has been read back.   . Lymphocytes Relative 12/30/2018 61  % Final  . Lymphs Abs  12/30/2018 1.0  0.7 - 4.0 K/uL Final  . Monocytes Relative 12/30/2018 18  % Final  . Monocytes Absolute 12/30/2018 0.3  0.1 - 1.0 K/uL Final  . Eosinophils Relative 12/30/2018 2  % Final  . Eosinophils Absolute 12/30/2018 0.0  0.0 - 0.5 K/uL Final  . Basophils Relative 12/30/2018 2  % Final  . Basophils Absolute 12/30/2018 0.0  0.0 - 0.1 K/uL Final  . Immature Granulocytes 12/30/2018 1  % Final  . Abs Immature Granulocytes 12/30/2018 0.02  0.00 - 0.07 K/uL Final   Performed at [facility], [place] [address], [place], [place] [address]  . Color, Urine 12/30/2018 YELLOW  YELLOW Final  . APPearance 12/30/2018 CLEAR  CLEAR Final  . Specific Gravity, Urine 12/30/2018 1.004* 1.005 - 1.030 Final  . pH 12/30/2018 8.0  5.0 - 8.0 Final  . Glucose, UA 12/30/2018 NEGATIVE  NEGATIVE mg/dL Final  . Hgb urine dipstick 12/30/2018 NEGATIVE  NEGATIVE Final  . Bilirubin Urine 12/30/2018 NEGATIVE  NEGATIVE Final  . Ketones, ur 12/30/2018 NEGATIVE  NEGATIVE mg/dL Final  . Protein, ur 12/30/2018 NEGATIVE  NEGATIVE mg/dL Final  . Nitrite 12/30/2018 NEGATIVE  NEGATIVE Final  . Leukocytes,Ua 12/30/2018 NEGATIVE  NEGATIVE Final  . RBC / HPF 12/30/2018 0-5  0 - 5 RBC/hpf Final  . WBC, UA 12/30/2018 0-5  0 - 5 WBC/hpf Final  . Bacteria, UA 12/30/2018 NONE SEEN  NONE SEEN Final  . Squamous Epithelial / LPF 12/30/2018 0-5  0 - 5 Final   Performed at [facility], [address] [place],  [place] [address]  . Specimen Description 12/30/2018    Final                   Value:URINE, CLEAN CATCH Performed at [facility], [place] [address] [place], [place] [address]   . Special Requests 12/30/2018    Final                   Value:NONE Performed at [facility], [place] [address] [place], [place] [address]   . Culture 12/30/2018    Final                   Value:NO GROWTH Performed at [facility], [place] [address]  [place], [place] [address]   . Report Status 12/30/2018 12/31/2018 FINAL   Final    (this displays the last labs from the last 3 days)  No results found for: TOTALPROTELP, ALBUMINELP, A1GS, A2GS, BETS, BETA2SER, GAMS, MSPIKE, SPEI (this displays SPEP labs)  No results found for: KPAFRELGTCHN, LAMBDASER, KAPLAMBRATIO (kappa/lambda light chains)  No results found for: HGBA, HGBA2QUANT, HGBFQUANT, HGBSQUAN (Hemoglobinopathy evaluation)   No results found for: LDH  No results found for: IRON, TIBC, IRONPCTSAT (Iron and TIBC)  No results found for: FERRITIN  Urinalysis    Component Value Date/Time   COLORURINE YELLOW 12/30/2018 0948   APPEARANCEUR CLEAR 12/30/2018 0948   LABSPEC 1.004 (L) 12/30/2018 0948   PHURINE 8.0 12/30/2018 [place] 12/30/2018 0948   HGBUR NEGATIVE 12/30/2018 [place] 12/30/2018 0948   KETONESUR NEGATIVE 12/30/2018 0948   PROTEINUR NEGATIVE 12/30/2018 0948   NITRITE NEGATIVE 12/30/2018 0948   LEUKOCYTESUR NEGATIVE 12/30/2018 0948     STUDIES: No results found.  ELIGIBLE FOR AVAILABLE RESEARCH PROTOCOL: no  ASSESSMENT: 58 y.o. [place] woman   (1) status post right lumpectomy in 1997,  (a) status post adjuvant radiation  (b) did not receive antiestrogens or chemotherapy  (2) status post right breast upper outer quadrant biopsy 09/23/2018 for a clinical T2 N0, stage IIA invasive ductal carcinoma, grade 3, estrogen and progesterone receptor positive, HER-2 not amplified, with an MIB-1 of 70%  (3) Oncotype obtained from the initial biopsy showed a score of 26 predicting a risk of recurrence outside the breast of 16/21% (depending on nodal status), if the patient's only systemic therapy is an antiestrogen for 5 years.  It also predicts a significant benefit from chemotherapy  (4) status post right mastectomy and sentinel lymph node sampling 10/22/2018 for a pT2 pN0, stage IB invasive ductal carcinoma, grade 2, with  negative margins  (a) not planning reconstruction at this point  (5) adjuvant chemotherapy recommended on 11/05/2018 by Dr. Jana Hakim  (a) second opinion with Dr. Annabell Sabal at [facility] on 11/18/2018 recommended 4 cycles of adjuvant docetaxel and cyclophosphamide   (b) Third opinion with Dr. Adan Sis on 11/24/2018 recommended 4 cycles of adjuvant Docetaxel and Cyclophosphamide  (c) To start adjuvant chemotherapy with four cycles of Docetaxel and Cyclophosphamide on 12/23/2018  (6) anastrozole started 09/30/2018 (patient stopped due to concerns over bone loss--to restart once adjuvant therapy has completed)  (a) Bone density on 12/10/2018 shows a T score of -3.8 in the AP spine.    (7) genetics testing 10/06/2018 through the [facility] Common Hereditary Cancers Panel found no deleterious mutations in APC, ATM, AXIN2, BARD1, BMPR1A, BRCA1, BRCA2, BRIP1, CDH1, CDKN2A (p14ARF), CDKN2A (p16INK4a), CKD4, CHEK2, CTNNA1, DICER1, EPCAM (Deletion/duplication testing only), GREM1 (promoter region deletion/duplication  testing only), KIT, MEN1, MLH1, MSH2, MSH3, MSH6, MUTYH, NBN, NF1, NHTL1, PALB2, PDGFRA, PMS2, POLD1, POLE, PTEN, RAD50, RAD51C, RAD51D, SDHB, SDHC, SDHD, SMAD4, SMARCA4. STK11, TP53, TSC1, TSC2, and VHL. The following genes were evaluated for sequence changes only: SDHA and HOXB13 c.251G>A variant only.   (a) 2 VUS's identified: VUS in APC c.-34014C>T and VHL c.340+272T>G identified   PLAN: Alice [name] is doing well today.  Her labs are stable and she will proceed with her second cycle of Docetaxel and Cyclophosphamide.  She and I discussed in detail her chemotherapy and her lab work, and her kidney function.  Alice [name] and I talked about what to expect after treatment, and I answered her questions about antiestrogen therapies.    Alice [name] requested that the IV nurse start her IV with the ultrasound again.  I sent this request to the infusion room.    Alice [name] will return on Friday for IV fluids, and in 1 week for  labs and f/u.  She was recommended to continue with the appropriate pandemic precautions. She knows to call for any questions that may arise between now and her next appointment.  We are happy to see her sooner if needed.  A total of (30) minutes of face-to-face time was spent with this patient with greater than 50% of that time in counseling and care-coordination.   Wilber Bihari, NP   01/13/2019 8:56 AM Medical Oncology and Hematology [facility] [address] [place], [place] [address] Tel. [phone]    Fax. [phone]  At the completion of the appointment, the patient had a question for me, and her nurse sent her over to talk to me.  The patient has questions about her bowel and if something could be prescribed for her bowel.  I reviewed my recommendations for bowel regimens, which are all available over the counter.

## 2019-01-13 NOTE — Patient Instructions (Signed)
[place] Discharge Instructions for Patients Receiving Chemotherapy  Today you received the following chemotherapy agents Docetaxel (TAXOTERE) & Cyclophosphamide (CYTOXAN).  To help prevent nausea and vomiting after your treatment, we encourage you to take your nausea medication as prescribed.   If you develop nausea and vomiting that is not controlled by your nausea medication, call the clinic.   BELOW ARE SYMPTOMS THAT SHOULD BE REPORTED IMMEDIATELY:  *FEVER GREATER THAN 100.5 F  *CHILLS WITH OR WITHOUT FEVER  NAUSEA AND VOMITING THAT IS NOT CONTROLLED WITH YOUR NAUSEA MEDICATION  *UNUSUAL SHORTNESS OF BREATH  *UNUSUAL BRUISING OR BLEEDING  TENDERNESS IN MOUTH AND THROAT WITH OR WITHOUT PRESENCE OF ULCERS  *URINARY PROBLEMS  *BOWEL PROBLEMS  UNUSUAL RASH Items with * indicate a potential emergency and should be followed up as soon as possible.  Feel free to call the clinic should you have any questions or concerns. The clinic phone number is [phone].  Please show the [place] at check-in to the Emergency Department and triage nurse.  Coronavirus (COVID-19) Are you at risk?  Are you at risk for the Coronavirus (COVID-19)?  To be considered HIGH RISK for Coronavirus (COVID-19), you have to meet the following criteria:  . Traveled to [place], [place], [place], [place] or [place]; or in the [place] to [place], [place], [place], or [place]; and have fever, cough, and shortness of breath within the last 2 weeks of travel OR . Been in close contact with a person diagnosed with COVID-19 within the last 2 weeks and have fever, cough, and shortness of breath . IF YOU DO NOT MEET THESE CRITERIA, YOU ARE CONSIDERED LOW RISK FOR COVID-19.  What to do if you are HIGH RISK for COVID-19?  Marland Kitchen If you are having a medical emergency, call 911. . Seek medical care right away. Before you go to a doctor's office, urgent care or emergency  department, call ahead and tell them about your recent travel, contact with someone diagnosed with COVID-19, and your symptoms. You should receive instructions from your physician's office regarding next steps of care.  . When you arrive at healthcare provider, tell the healthcare staff immediately you have returned from visiting [place], [place], [place], [place] or [place]; or traveled in the [place] to [place], [place], [place], or [place]; in the last two weeks or you have been in close contact with a person diagnosed with COVID-19 in the last 2 weeks.   . Tell the health care staff about your symptoms: fever, cough and shortness of breath. . After you have been seen by a medical provider, you will be either: o Tested for (COVID-19) and discharged home on quarantine except to seek medical care if symptoms worsen, and asked to  - Stay home and avoid contact with others until you get your results (4-5 days)  - Avoid travel on public transportation if possible (such as bus, train, or airplane) or o Sent to the Emergency Department by EMS for evaluation, COVID-19 testing, and possible admission depending on your condition and test results.  What to do if you are LOW RISK for COVID-19?  Reduce your risk of any infection by using the same precautions used for avoiding the common cold or flu:  Marland Kitchen Wash your hands often with soap and warm water for at least 20 seconds.  If soap and water are not readily available, use an alcohol-based hand sanitizer with at least 60% alcohol.  Marland Kitchen  If coughing or sneezing, cover your mouth and nose by coughing or sneezing into the elbow areas of your shirt or coat, into a tissue or into your sleeve (not your hands). . Avoid shaking hands with others and consider head nods or verbal greetings only. . Avoid touching your eyes, nose, or mouth with unwashed hands.  . Avoid close contact with people who are sick. . Avoid places or events with large numbers of people  in one location, like concerts or sporting events. . Carefully consider travel plans you have or are making. . If you are planning any travel outside or inside the [place], visit the CDC's Travelers' Health webpage for the latest health notices. . If you have some symptoms but not all symptoms, continue to monitor at home and seek medical attention if your symptoms worsen. . If you are having a medical emergency, call 911.   ADDITIONAL HEALTHCARE OPTIONS FOR PATIENTS  [facility] / e-Visit: [email or web address]         [facility]: [phone]  [facility]: [phone]                   [facility]: [phone]   

## 2019-01-14 ENCOUNTER — Telehealth: Payer: Self-pay | Admitting: Adult Health

## 2019-01-14 NOTE — Telephone Encounter (Signed)
I talk with patient regarding schedule  

## 2019-01-15 ENCOUNTER — Other Ambulatory Visit: Payer: Self-pay

## 2019-01-15 ENCOUNTER — Inpatient Hospital Stay (HOSPITAL_BASED_OUTPATIENT_CLINIC_OR_DEPARTMENT_OTHER): Payer: BC Managed Care – PPO | Admitting: Medical

## 2019-01-15 ENCOUNTER — Other Ambulatory Visit: Payer: Self-pay | Admitting: Medical

## 2019-01-15 ENCOUNTER — Other Ambulatory Visit: Payer: Self-pay | Admitting: Adult Health

## 2019-01-15 ENCOUNTER — Inpatient Hospital Stay: Payer: BC Managed Care – PPO

## 2019-01-15 VITALS — BP 99/67 | HR 70 | Temp 98.0°F | Resp 18

## 2019-01-15 DIAGNOSIS — C50411 Malignant neoplasm of upper-outer quadrant of right female breast: Secondary | ICD-10-CM | POA: Diagnosis not present

## 2019-01-15 DIAGNOSIS — Z170 Estrogen receptor positive status [ER+]: Secondary | ICD-10-CM

## 2019-01-15 DIAGNOSIS — M545 Low back pain, unspecified: Secondary | ICD-10-CM

## 2019-01-15 DIAGNOSIS — Z5111 Encounter for antineoplastic chemotherapy: Secondary | ICD-10-CM | POA: Diagnosis not present

## 2019-01-15 DIAGNOSIS — R1012 Left upper quadrant pain: Secondary | ICD-10-CM

## 2019-01-15 DIAGNOSIS — Z5189 Encounter for other specified aftercare: Secondary | ICD-10-CM | POA: Diagnosis not present

## 2019-01-15 DIAGNOSIS — R21 Rash and other nonspecific skin eruption: Secondary | ICD-10-CM | POA: Diagnosis not present

## 2019-01-15 DIAGNOSIS — Z79811 Long term (current) use of aromatase inhibitors: Secondary | ICD-10-CM | POA: Diagnosis not present

## 2019-01-15 DIAGNOSIS — E860 Dehydration: Secondary | ICD-10-CM | POA: Diagnosis not present

## 2019-01-15 DIAGNOSIS — Z79899 Other long term (current) drug therapy: Secondary | ICD-10-CM | POA: Diagnosis not present

## 2019-01-15 DIAGNOSIS — D709 Neutropenia, unspecified: Secondary | ICD-10-CM | POA: Diagnosis not present

## 2019-01-15 DIAGNOSIS — K219 Gastro-esophageal reflux disease without esophagitis: Secondary | ICD-10-CM | POA: Diagnosis not present

## 2019-01-15 MED ORDER — PEGFILGRASTIM-JMDB 6 MG/0.6ML ~~LOC~~ SOSY
PREFILLED_SYRINGE | SUBCUTANEOUS | Status: AC
  Filled 2019-01-15: qty 0.6

## 2019-01-15 MED ORDER — LIDOCAINE VISCOUS HCL 2 % MT SOLN
15.0000 mL | Freq: Once | OROMUCOSAL | Status: AC
  Administered 2019-01-15: 15 mL via OROMUCOSAL
  Filled 2019-01-15: qty 15

## 2019-01-15 MED ORDER — OXYCODONE-ACETAMINOPHEN 5-325 MG PO TABS
1.0000 | ORAL_TABLET | Freq: Once | ORAL | Status: AC
  Administered 2019-01-15: 1 via ORAL

## 2019-01-15 MED ORDER — OMEPRAZOLE 40 MG PO CPDR: 40.0000 mg | DELAYED_RELEASE_CAPSULE | Freq: Every day | ORAL | 5 refills | Status: DC

## 2019-01-15 MED ORDER — PEGFILGRASTIM-JMDB 6 MG/0.6ML ~~LOC~~ SOSY
6.0000 mg | PREFILLED_SYRINGE | Freq: Once | SUBCUTANEOUS | Status: AC
  Administered 2019-01-15: 6 mg via SUBCUTANEOUS

## 2019-01-15 MED ORDER — ALUM & MAG HYDROXIDE-SIMETH 200-200-20 MG/5ML PO SUSP
ORAL | Status: AC
  Filled 2019-01-15: qty 30

## 2019-01-15 MED ORDER — OXYCODONE-ACETAMINOPHEN 5-325 MG PO TABS
ORAL_TABLET | ORAL | Status: AC
  Filled 2019-01-15: qty 2

## 2019-01-15 MED ORDER — HYOSCYAMINE SULFATE 0.125 MG SL SUBL: 0.2500 mg | SUBLINGUAL_TABLET | Freq: Once | SUBLINGUAL | Status: DC

## 2019-01-15 MED ORDER — SODIUM CHLORIDE 0.9 % IV SOLN
Freq: Once | INTRAVENOUS | Status: AC
  Administered 2019-01-15: 14:00:00 via INTRAVENOUS
  Filled 2019-01-15: qty 250

## 2019-01-15 MED ORDER — ALUM & MAG HYDROXIDE-SIMETH 200-200-20 MG/5ML PO SUSP
30.0000 mL | Freq: Once | ORAL | Status: AC
  Administered 2019-01-15: 30 mL via ORAL

## 2019-01-15 NOTE — Patient Instructions (Signed)
Pegfilgrastim injection What is this medicine? PEGFILGRASTIM (PEG fil gra stim) is a long-acting granulocyte colony-stimulating factor that stimulates the growth of neutrophils, a type of white blood cell important in the body's fight against infection. It is used to reduce the incidence of fever and infection in patients with certain types of cancer who are receiving chemotherapy that affects the bone marrow, and to increase survival after being exposed to high doses of radiation. This medicine may be used for other purposes; ask your health care provider or pharmacist if you have questions. COMMON BRAND NAME(S): Steve Rattler, Ziextenzo What should I tell my health care provider before I take this medicine? They need to know if you have any of these conditions:  kidney disease  latex allergy  ongoing radiation therapy  sickle cell disease  skin reactions to acrylic adhesives (On-Body Injector only)  an unusual or allergic reaction to pegfilgrastim, filgrastim, other medicines, foods, dyes, or preservatives  pregnant or trying to get pregnant  breast-feeding How should I use this medicine? This medicine is for injection under the skin. If you get this medicine at home, you will be taught how to prepare and give the pre-filled syringe or how to use the On-body Injector. Refer to the patient Instructions for Use for detailed instructions. Use exactly as directed. Tell your healthcare provider immediately if you suspect that the On-body Injector may not have performed as intended or if you suspect the use of the On-body Injector resulted in a missed or partial dose. It is important that you put your used needles and syringes in a special sharps container. Do not put them in a trash can. If you do not have a sharps container, call your pharmacist or healthcare provider to get one. Talk to your pediatrician regarding the use of this medicine in children. While this drug may be  prescribed for selected conditions, precautions do apply. Overdosage: If you think you have taken too much of this medicine contact a poison control center or emergency room at once. NOTE: This medicine is only for you. Do not share this medicine with others. What if I miss a dose? It is important not to miss your dose. Call your doctor or health care professional if you miss your dose. If you miss a dose due to an On-body Injector failure or leakage, a new dose should be administered as soon as possible using a single prefilled syringe for manual use. What may interact with this medicine? Interactions have not been studied. Give your health care provider a list of all the medicines, herbs, non-prescription drugs, or dietary supplements you use. Also tell them if you smoke, drink alcohol, or use illegal drugs. Some items may interact with your medicine. This list may not describe all possible interactions. Give your health care provider a list of all the medicines, herbs, non-prescription drugs, or dietary supplements you use. Also tell them if you smoke, drink alcohol, or use illegal drugs. Some items may interact with your medicine. What should I watch for while using this medicine? You may need blood work done while you are taking this medicine. If you are going to need a MRI, CT scan, or other procedure, tell your doctor that you are using this medicine (On-Body Injector only). What side effects may I notice from receiving this medicine? Side effects that you should report to your doctor or health care professional as soon as possible:  allergic reactions like skin rash, itching or hives, swelling of the  face, lips, or tongue  back pain  dizziness  fever  pain, redness, or irritation at site where injected  pinpoint red spots on the skin  red or dark-brown urine  shortness of breath or breathing problems  stomach or side pain, or pain at the  shoulder  swelling  tiredness  trouble passing urine or change in the amount of urine Side effects that usually do not require medical attention (report to your doctor or health care professional if they continue or are bothersome):  bone pain  muscle pain This list may not describe all possible side effects. Call your doctor for medical advice about side effects. You may report side effects to FDA at 1-800-FDA-1088. Where should I keep my medicine? Keep out of the reach of children. If you are using this medicine at home, you will be instructed on how to store it. Throw away any unused medicine after the expiration date on the label. NOTE: This sheet is a summary. It may not cover all possible information. If you have questions about this medicine, talk to your doctor, pharmacist, or health care provider.  2020 Elsevier/Gold Standard (2017-07-21 16:57:08)   Rehydration, Adult Rehydration is the replacement of body fluids and salts and minerals (electrolytes) that are lost during dehydration. Dehydration is when there is not enough fluid or water in the body. This happens when you lose more fluids than you take in. Common causes of dehydration include:  Vomiting.  Diarrhea.  Excessive sweating, such as from heat exposure or exercise.  Taking medicines that cause the body to lose excess fluid (diuretics).  Impaired kidney function.  Not drinking enough fluid.  Certain illnesses or infections.  Certain poorly controlled long-term (chronic) illnesses, such as diabetes, heart disease, and kidney disease.  Symptoms of mild dehydration may include thirst, dry lips and mouth, dry skin, and dizziness. Symptoms of severe dehydration may include increased heart rate, confusion, fainting, and not urinating. You can rehydrate by drinking certain fluids or getting fluids through an IV tube, as told by your health care provider. What are the risks? Generally, rehydration is safe.  However, one problem that can happen is taking in too much fluid (overhydration). This is rare. If overhydration happens, it can cause an electrolyte imbalance, kidney failure, or a decrease in salt (sodium) levels in the body. How to rehydrate Follow instructions from your health care provider for rehydration. The kind of fluid you should drink and the amount you should drink depend on your condition.  If directed by your health care provider, drink an oral rehydration solution (ORS). This is a drink designed to treat dehydration that is found in pharmacies and retail stores. ? Make an ORS by following instructions on the package. ? Start by drinking small amounts, about  cup (120 mL) every 5-10 minutes. ? Slowly increase how much you drink until you have taken the amount recommended by your health care provider.  Drink enough clear fluids to keep your urine clear or pale yellow. If you were instructed to drink an ORS, finish the ORS first, then start slowly drinking other clear fluids. Drink fluids such as: ? Water. Do not drink only water. Doing that can lead to having too little sodium in your body (hyponatremia). ? Ice chips. ? Fruit juice that you have added water to (diluted juice). ? Low-calorie sports drinks.  If you are severely dehydrated, your health care provider may recommend that you receive fluids through an IV tube in the hospital.  Do not take sodium tablets. Doing that can lead to the condition of having too much sodium in your body (hypernatremia). Eating while you rehydrate Follow instructions from your health care provider about what to eat while you rehydrate. Your health care provider may recommend that you slowly begin eating regular foods in small amounts.  Eat foods that contain a healthy balance of electrolytes, such as bananas, oranges, potatoes, tomatoes, and spinach.  Avoid foods that are greasy or contain a lot of fat or sugar.  In some cases, you may get  nutrition through a feeding tube that is passed through your nose and into your stomach (nasogastric tube, or NG tube). This may be done if you have uncontrolled vomiting or diarrhea. Beverages to avoid Certain beverages may make dehydration worse. While you rehydrate, avoid:  Alcohol.  Caffeine.  Drinks that contain a lot of sugar. These include: ? High-calorie sports drinks. ? Fruit juice that is not diluted. ? Soda.  Check nutrition labels to see how much sugar or caffeine a beverage contains. Signs of dehydration recovery You may be recovering from dehydration if:  You are urinating more often than before you started rehydrating.  Your urine is clear or pale yellow.  Your energy level improves.  You vomit less frequently.  You have diarrhea less frequently.  Your appetite improves or returns to normal.  You feel less dizzy or less light-headed.  Your skin tone and color start to look more normal. Contact a health care provider if:  You continue to have symptoms of mild dehydration, such as: ? Thirst. ? Dry lips. ? Slightly dry mouth. ? Dry, warm skin. ? Dizziness.  You continue to vomit or have diarrhea. Get help right away if:  You have symptoms of dehydration that get worse.  You feel: ? Confused. ? Weak. ? Like you are going to faint.  You have not urinated in 6-8 hours.  You have very dark urine.  You have trouble breathing.  Your heart rate while sitting still is over 100 beats a minute.  You cannot drink fluids without vomiting.  You have vomiting or diarrhea that: ? Gets worse. ? Does not go away.  You have a fever. This information is not intended to replace advice given to you by your health care provider. Make sure you discuss any questions you have with your health care provider. Document Released: 07/08/2011 Document Revised: 03/28/2017 Document Reviewed: 06/09/2015 Elsevier Patient Education  2020 [employer].

## 2019-01-15 NOTE — Progress Notes (Signed)
[facility] Progress Note   Alice [name] [id number] 10-27-1960 58 y.o.  Alice [name] is managed by Dr. Lurline Del  Actively treated with chemotherapy/immunotherapy/hormonal therapy: yes  Current therapy: Cytoxan and Taxotere with pegfilgrastim support  Last treated: 01/13/2019 (cycle 2, day 1)  Next scheduled appointment with provider: 01/20/2019.  Assessment: Plan:    Malignant neoplasm of upper-outer quadrant of right breast in female, estrogen receptor positive ([place])  Left upper quadrant pain  Acute midline low back pain without sciatica - Plan: oxyCODONE-acetaminophen (PERCOCET/ROXICET) 5-325 MG per tablet 1 tablet  Gastroesophageal reflux disease, esophagitis presence not specified   Stage Ia ER positive malignant neoplasm of the right breast: Patient continues to be managed by Dr. Jana Hakim and is status post cycle 2, day 1 of Cytoxan and Taxotere with pegfilgrastim support.  This was dosed on 01/13/2019.  She is scheduled to be seen in follow-up on 01/20/2019.  Left upper quadrant pain: The patient was given a GI cocktail today.  GERD: Patient was given a prescription for Prilosec 40 mg p.o. once daily.  Please see After Visit Summary for patient specific instructions.  Future Appointments  Date Time Provider [place]  01/20/2019  9:00 AM [facility] LAB 1 [facility] None  01/20/2019  9:30 AM [place] Phlegm, NP [facility] None  02/03/2019  8:00 AM [facility] LAB 5 [facility] None  02/03/2019  8:30 AM [place] Phlegm, NP [facility] None  02/03/2019  9:30 AM [facility] INFUSION [facility] None  02/05/2019 11:15 AM [facility] [place] FLUSH [facility] None  02/24/2019  8:00 AM [facility] LAB 4 [facility] None  02/24/2019  8:30 AM Causey, Charlestine Massed, NP [facility] None  02/24/2019  9:30 AM [facility] INFUSION [facility] None  02/26/2019 11:15 AM [facility] [place] FLUSH [facility] None    No  orders of the defined types were placed in this encounter.      Subjective:   Patient ID:  Alice [name] is a 58 y.o. (DOB April 15, 1961) female.  Chief Complaint: No chief complaint on file.   HPI Alice [name]   is a 58 year old female with a history of a stage Ia ER positive malignant breast cancer who is managed by Dr. Jana Hakim.  She is status post cycle 2 of Cytoxan and Taxotere with pegfilgrastim support which was dosed on 01/13/2019.  She was seen in the infusion room today as she was receiving IV fluids.  She reports that she has had abdominal pain in her left upper quadrant since yesterday.  She also asked for Percocet today stating that she always gets Percocet for back pain when she is here.  She is previously taken medications for GERD but is currently not taking any.  She denies nausea, vomiting, constipation, diarrhea, bright red blood per rectum, or melena.  Medications: I have reviewed the patient's current medications.  Allergies: No Known Allergies  Past Medical History:  Diagnosis Date  . Anemia   . Breast cancer ([place])    right in 1997  . Family history of lung cancer   . Family history of ovarian cancer   . GERD (gastroesophageal reflux disease) 06/30/2018  . Herpes simplex type 2 infection 02/2006  . History of breast cancer 1997  . History of esophagogastroduodenoscopy (EGD) 09/15/2015   small hiatal hernia was noted otherwise all was normal  . Personal history of breast cancer     Past Surgical History:  Procedure Laterality Date  . BREAST ENHANCEMENT SURGERY     s/p cancer and surgery, right  . BREAST LUMPECTOMY  right due to breast cancer  . MASTECTOMY W/ SENTINEL NODE BIOPSY Right 10/22/2018   Procedure: RIGHT MASTECTOMY WITH RIGHT AXILLARY SENTINEL LYMPH NODE BIOPSY;  Surgeon: Rolm Bookbinder, MD;  Location: [facility];  Service: General;  Laterality: Right;    Family History  Problem Relation Age of Onset  .  Ovarian cancer Sister 26  . Dementia Mother   . Aneurysm Father     Social History   Socioeconomic History  . Marital status: Married    Spouse name: Not on file  . Number of children: Not on file  . Years of education: Not on file  . Highest education level: Not on file  Occupational History  . Not on file  Social Needs  . Financial resource strain: Not on file  . Food insecurity    Worry: Not on file    Inability: Not on file  . Transportation needs    Medical: Not on file    Non-medical: Not on file  Tobacco Use  . Smoking status: Never Smoker  . Smokeless tobacco: Never Used  Substance and Sexual Activity  . Alcohol use: No    Alcohol/week: 0.0 standard drinks  . Drug use: No  . Sexual activity: Not Currently  Lifestyle  . Physical activity    Days per week: Not on file    Minutes per session: Not on file  . Stress: Not on file  Relationships  . Social Herbalist on phone: Not on file    Gets together: Not on file    Attends religious service: Not on file    Active member of club or organization: Not on file    Attends meetings of clubs or organizations: Not on file    Relationship status: Not on file  . Intimate partner violence    Fear of current or ex partner: Not on file    Emotionally abused: Not on file    Physically abused: Not on file    Forced sexual activity: Not on file  Other Topics Concern  . Not on file  Social History Narrative  . Not on file    Past Medical History, Surgical history, Social history, and Family history were reviewed and updated as appropriate.   Please see review of systems for further details on the patient's review from today.   Review of Systems:  Review of Systems  Constitutional: Negative for activity change, appetite change, chills, diaphoresis and fever.  HENT: Negative for trouble swallowing.   Respiratory: Negative for cough, chest tightness and shortness of breath.   Cardiovascular: Negative for  chest pain, palpitations and leg swelling.  Gastrointestinal: Positive for abdominal pain. Negative for abdominal distention, constipation, diarrhea, nausea and vomiting.  Musculoskeletal: Positive for back pain.    Objective:   Physical Exam:  There were no vitals taken for this visit. ECOG: 0  Physical Exam Constitutional:      General: She is not in acute distress.    Appearance: She is not diaphoretic.  HENT:     Head: Normocephalic and atraumatic.     Mouth/Throat:     Pharynx: No oropharyngeal exudate.  Neck:     Musculoskeletal: Normal range of motion and neck supple.  Cardiovascular:     Rate and Rhythm: Normal rate and regular rhythm.     Heart sounds: Normal heart sounds. No murmur. No friction rub. No gallop.   Pulmonary:     Effort: Pulmonary effort is normal. No respiratory  distress.     Breath sounds: Normal breath sounds. No wheezing or rales.  Abdominal:     General: Bowel sounds are normal. There is no distension.     Palpations: Abdomen is soft. There is no mass.     Tenderness: There is no abdominal tenderness. There is no guarding or rebound.  Lymphadenopathy:     Cervical: No cervical adenopathy.  Skin:    General: Skin is warm and dry.     Findings: No erythema or rash.  Neurological:     Mental Status: She is alert.     Lab Review:     Component Value Date/Time   NA 140 01/13/2019 0822   K 4.3 01/13/2019 0822   CL 111 01/13/2019 0822   CO2 22 01/13/2019 0822   GLUCOSE 85 01/13/2019 0822   BUN <4 (L) 01/13/2019 0822   CREATININE 0.63 01/13/2019 0822   CREATININE 0.73 09/30/2018 0819   CALCIUM 8.8 (L) 01/13/2019 0822   PROT 6.6 01/13/2019 0822   ALBUMIN 3.4 (L) 01/13/2019 0822   AST 22 01/13/2019 0822   AST 20 09/30/2018 0819   ALT 7 01/13/2019 0822   ALT 6 09/30/2018 0819   ALKPHOS 94 01/13/2019 0822   BILITOT 0.4 01/13/2019 0822   BILITOT 0.5 09/30/2018 0819   GFRNONAA >60 01/13/2019 0822   GFRNONAA >60 09/30/2018 0819   GFRAA >60  01/13/2019 0822   GFRAA >60 09/30/2018 0819       Component Value Date/Time   WBC 5.8 01/13/2019 0822   RBC 3.73 (L) 01/13/2019 0822   HGB 11.7 (L) 01/13/2019 0822   HGB 13.1 09/30/2018 0819   HCT 35.7 (L) 01/13/2019 0822   PLT 386 01/13/2019 0822   PLT 353 09/30/2018 0819   MCV 95.7 01/13/2019 0822   MCH 31.4 01/13/2019 0822   MCHC 32.8 01/13/2019 0822   RDW 14.2 01/13/2019 0822   LYMPHSABS 1.2 01/13/2019 0822   MONOABS 0.5 01/13/2019 0822   EOSABS 0.0 01/13/2019 0822   BASOSABS 0.0 01/13/2019 0822   -------------------------------  Imaging from last 24 hours (if applicable):  Radiology interpretation: No results found.

## 2019-01-15 NOTE — Progress Notes (Signed)
Pt C/O mid L abdominal pain since yesterday, is intermittent & sharp, had tx on Wednesday, would like to speak to a provider.  [employer] PA informed, is coming to see pt.

## 2019-01-19 NOTE — Progress Notes (Signed)
[place]  [phone] Fax:([phone]) [phone]     ID: [name] DOB: 1961/04/15  MR#: [id number]  GYJ#:[id number]  Patient Care Team: [name], MD as PCP - General (Family Medicine) Mauro Kaufmann, RN as Oncology Nurse Navigator Rockwell Germany, RN as Oncology Nurse Navigator Magrinat, Virgie Dad, MD as Consulting Physician (Oncology) Kyung Rudd, MD as Consulting Physician (Radiation Oncology) Rolm Bookbinder, MD as Consulting Physician (General Surgery) Earnstine Regal, PA-C as Consulting Physician (Obstetrics and Gynecology) Juanita Craver, MD as Consulting Physician (Gastroenterology) [name], MD as Consulting Physician (Family Medicine) Cristine Polio, MD as Consulting Physician (Plastic Surgery) Scot Dock, NP OTHER MD:    CHIEF COMPLAINT: Estrogen receptor positive breast cancer  CURRENT TREATMENT: Adjuvant chemotherapy  INTERVAL HISTORY: Walter was seen today for follow up of her estrogen receptor positive breast cancer.  She is here today after starting adjuvant chemotherapy with Docetaxel and Cyclophosphamide given every 21 days x 4 cycles.  This is with Neulasta support.   Today is cycle 2 day 8.    REVIEW OF SYSTEMS: Dniyah is fatigued today.  She has some mild back pain.  She has a rash on her neck that started about 1-2 days ago.  She denies any sun exposure.  She also has some itching in her gluteal fold.  She notes some hyperpigmentation in the skin on her hands.  She denies peripheral neuropathy.    Mekaylah denies any fever or chills.  She is without chest pain, palpitations, cough, or shortness of breath.  She has no bowel/bladder changes, nausea, vomiting.  A detailed ROS was otherwise non contributory.    HISTORY OF CURRENT ILLNESS: From the original intake note:  Paytan Recine has a history of remote right breast cancer in 1997, treated with right lumpectomy and radiation in [place].  The patient did not receive chemotherapy or antiestrogen treatments.  More recently she presented for her annual mammogram with a non-tender area of firmness in the right breast at 9 o'clock for 2 months. She underwent bilateral diagnostic mammography with tomography and right breast ultrasonography at [facility] on 09/08/2018 showing: breast density category A; scattered-appearing fibroglandular tissue at the lumpectomy site in the upper outer right breast middle depth spanning 3.4 cm, with indistinct margins, was not seen on prior mammogram from 2018.  There was also a keloid scar in the inferior medial left breast.  On physical exam there was a rectangular area of hardness measuring up to 7 cm in the right breast at 9:00, corresponding with the prior lumpectomy and consistent with radiation change.  Ultrasound of this area showed only postlumpectomy change, with no suspicious or discrete mass.  Ultrasound of the axilla was benign  Accordingly on 09/23/2018 she proceeded to biopsy of the right breast area in question. The pathology from this procedure [facility] 20-07/02/2000 showed: invasive ductal carcinoma, E-cadherin positive,  grade 3,  Prognostic indicators significant for: estrogen receptor at 100% positive and progesterone receptor, 30% positive, both with strong staining intensity proliferation marker Ki67 at 70%. HER2 negative by immunohistochemistry (0).  The patient's subsequent history is as detailed below.   PAST MEDICAL HISTORY: Past Medical History:  Diagnosis Date   Anemia    Breast cancer ([place])    right in 1997   Family history of lung cancer    Family history of ovarian cancer    GERD (gastroesophageal reflux disease) 06/30/2018   Herpes simplex type 2 infection 02/2006   History of breast cancer 1997  History of esophagogastroduodenoscopy (EGD) 09/15/2015   small hiatal hernia was noted otherwise all was normal   Personal history of breast cancer     PAST SURGICAL  HISTORY: Past Surgical History:  Procedure Laterality Date   BREAST ENHANCEMENT SURGERY     s/p cancer and surgery, right   BREAST LUMPECTOMY     right due to breast cancer   MASTECTOMY W/ SENTINEL NODE BIOPSY Right 10/22/2018   Procedure: RIGHT MASTECTOMY WITH RIGHT AXILLARY SENTINEL LYMPH NODE BIOPSY;  Surgeon: Rolm Bookbinder, MD;  Location: [facility];  Service: General;  Laterality: Right;  wisdom teeth removal   FAMILY HISTORY: Family History  Problem Relation Age of Onset   Ovarian cancer Sister 45   Dementia Mother    Aneurysm Father    Patient's father was 83 years old when he died from aneurysm. Patient's mother died from dementia at age 50. Her sister was diagnosed with ovarian cancer at age 47, and this subsequently took her life.  In total of the patient has 5 brothers and 2 sisters.  She is not aware of other cancers in the family  GYNECOLOGIC HISTORY:  No LMP recorded. Patient is postmenopausal. Menarche: 58 years old [place] P 0 LMP age 68 Contraceptive none HRT none  Hysterectomy? no BSO? 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 She is married. Husband Edmonia Caprio is from [place], so his native language is [place]. She lives at home with her husband. They have no children. She attends a [employer] in [place].    ADVANCED DIRECTIVES: In the absence of documents to the contrary her husband is automatically her HCPOA.   HEALTH MAINTENANCE: Social History   Tobacco Use   Smoking status: Never Smoker   Smokeless tobacco: Never Used  Substance Use Topics   Alcohol use: No    Alcohol/week: 0.0 standard drinks   Drug use: No     Colonoscopy: endoscopy in 2017, colonoscopy due 2020 but delayed due to virus  PAP: 12/03/2017, normal  Bone density: never done   No Known Allergies  Current Outpatient Medications  Medication Sig Dispense Refill   anastrozole  (ARIMIDEX) 1 MG tablet Take 1 tablet (1 mg total) by mouth daily. 90 tablet 4   ciprofloxacin (CIPRO) 500 MG tablet Take 1 tablet (500 mg total) by mouth 2 (two) times daily. 10 tablet 0   dexamethasone (DECADRON) 4 MG tablet Take 2 tablets (8 mg total) by mouth 2 (two) times daily. Start the day before Taxotere. Then again the day after chemo for 3 days. 30 tablet 1   lidocaine-prilocaine (EMLA) cream Apply to affected area once 30 g 3   LORazepam (ATIVAN) 0.5 MG tablet Take 1 tablet (0.5 mg total) by mouth at bedtime as needed (Nausea or vomiting). 30 tablet 0   omeprazole (PRILOSEC) 40 MG capsule Take 1 capsule (40 mg total) by mouth daily. 30 capsule 5   oxyCODONE-acetaminophen (PERCOCET/ROXICET) 5-325 MG tablet Take 1 tablet by mouth every 6 (six) hours as needed for severe pain. 15 tablet 0   prochlorperazine (COMPAZINE) 10 MG tablet Take 1 tablet (10 mg total) by mouth every 6 (six) hours as needed (Nausea or vomiting). 30 tablet 1   clotrimazole-betamethasone (LOTRISONE) cream Apply 1 application topically 2 (two) times daily. 30 g 0   No current facility-administered medications for this visit.     OBJECTIVE:  BP 98/80 (BP Location: Left Arm,  Patient Position: Sitting)    Pulse (!) 101    Temp 98.3 F (36.8 C) (Oral)    Resp 18    Ht _0  (1.626 m)    Wt 172 lb 1.6 oz (78.1 kg)    SpO2 100%    BMI 29.54 kg/m  Filed Weights   01/20/19 0940  Weight: 172 lb 1.6 oz (78.1 kg)   ECOG: 1 GENERAL: Patient appears well and she is in no apparent distress HEENT:  Sclerae anicteric.  Oropharynx clear and moist. No ulcerations or evidence of oropharyngeal candidiasis. Neck is supple.  NODES:  No cervical, supraclavicular, or axillary lymphadenopathy palpated.  BREAST EXAM: deferred. LUNGS:  Clear to auscultation bilaterally.  No wheezes or rhonchi. HEART:  Regular rate and rhythm. No murmur appreciated. ABDOMEN:  Soft, nontender.  Positive, normoactive bowel sounds. No organomegaly  palpated. MSK:  No focal spinal tenderness to palpation. Full range of motion bilaterally in the upper extremities. EXTREMITIES:  No peripheral edema.   SKIN:  5 areas of pinpoint skin lesions, have been scratched and are scabbed over, also, perineal area, below anus with small hyperpigmented small circular well circumscribed rash. NEURO:  Nonfocal. Well oriented.  Anxious    LAB RESULTS:  CMP     Component Value Date/Time   NA 140 01/20/2019 0926   K 4.1 01/20/2019 0926   CL 107 01/20/2019 0926   CO2 26 01/20/2019 0926   GLUCOSE 99 01/20/2019 0926   BUN <4 (L) 01/20/2019 0926   CREATININE 0.63 01/20/2019 0926   CREATININE 0.73 09/30/2018 0819   CALCIUM 9.1 01/20/2019 0926   PROT 7.0 01/20/2019 0926   ALBUMIN 3.5 01/20/2019 0926   AST 39 01/20/2019 0926   AST 20 09/30/2018 0819   ALT 19 01/20/2019 0926   ALT 6 09/30/2018 0819   ALKPHOS 99 01/20/2019 0926   BILITOT 0.3 01/20/2019 0926   BILITOT 0.5 09/30/2018 0819   GFRNONAA >60 01/20/2019 0926   GFRNONAA >60 09/30/2018 0819   GFRAA >60 01/20/2019 0926   GFRAA >60 09/30/2018 0819    No results found for: TOTALPROTELP, ALBUMINELP, A1GS, A2GS, BETS, BETA2SER, GAMS, MSPIKE, SPEI  No results found for: KPAFRELGTCHN, LAMBDASER, [facility]  Lab Results  Component Value Date   WBC 1.7 (L) 01/20/2019   NEUTROABS 0.6 (L) 01/20/2019   HGB 11.1 (L) 01/20/2019   HCT 33.3 (L) 01/20/2019   MCV 95.4 01/20/2019   PLT 262 01/20/2019    _1 @  No results found for: LABCA2  No components found for: [id number]  No results for input(s): INR in the last 168 hours.  No results found for: LABCA2  No results found for: EVO350  No results found for: KXF818  No results found for: EXH371  No results found for: CA2729  No components found for: HGQUANT  No results found for: CEA1 / No results found for: CEA1   No results found for: AFPTUMOR  No results found for: CHROMOGRNA  No results found for:  PSA1  Appointment on 01/20/2019  Component Date Value Ref Range Status   Sodium 01/20/2019 140  135 - 145 mmol/L Final   Potassium 01/20/2019 4.1  3.5 - 5.1 mmol/L Final   Chloride 01/20/2019 107  98 - 111 mmol/L Final   CO2 01/20/2019 26  22 - 32 mmol/L Final   Glucose, Bld 01/20/2019 99  70 - 99 mg/dL Final   BUN 01/20/2019 <4* 6 - 20 mg/dL Final   Creatinine, Ser 01/20/2019 0.63  0.44 -  1.00 mg/dL Final   Calcium 01/20/2019 9.1  8.9 - 10.3 mg/dL Final   Total Protein 01/20/2019 7.0  6.5 - 8.1 g/dL Final   Albumin 01/20/2019 3.5  3.5 - 5.0 g/dL Final   AST 01/20/2019 39  15 - 41 U/L Final   ALT 01/20/2019 19  0 - 44 U/L Final   Alkaline Phosphatase 01/20/2019 99  38 - 126 U/L Final   Total Bilirubin 01/20/2019 0.3  0.3 - 1.2 mg/dL Final   GFR calc non Af Amer 01/20/2019 >60  >60 mL/min Final   GFR calc Af Amer 01/20/2019 >60  >60 mL/min Final   Anion gap 01/20/2019 7  5 - 15 Final   Performed at [facility], [place] [address], [place], [place] [address]   WBC 01/20/2019 1.7* 4.0 - 10.5 K/uL Final   RBC 01/20/2019 3.49* 3.87 - 5.11 MIL/uL Final   Hemoglobin 01/20/2019 11.1* 12.0 - 15.0 g/dL Final   HCT 01/20/2019 33.3* 36.0 - 46.0 % Final   MCV 01/20/2019 95.4  80.0 - 100.0 fL Final   MCH 01/20/2019 31.8  26.0 - 34.0 pg Final   MCHC 01/20/2019 33.3  30.0 - 36.0 g/dL Final   RDW 01/20/2019 13.9  11.5 - 15.5 % Final   Platelets 01/20/2019 262  150 - 400 K/uL Final   nRBC 01/20/2019 0.0  0.0 - 0.2 % Final   Neutrophils Relative % 01/20/2019 38  % Final   Neutro Abs 01/20/2019 0.6* 1.7 - 7.7 K/uL Final   Lymphocytes Relative 01/20/2019 48  % Final   Lymphs Abs 01/20/2019 0.8  0.7 - 4.0 K/uL Final   Monocytes Relative 01/20/2019 11  % Final   Monocytes Absolute 01/20/2019 0.2  0.1 - 1.0 K/uL Final   Eosinophils Relative 01/20/2019 0  % Final   Eosinophils Absolute 01/20/2019 0.0  0.0 - 0.5 K/uL Final   Basophils Relative  01/20/2019 3  % Final   Basophils Absolute 01/20/2019 0.1  0.0 - 0.1 K/uL Final   Immature Granulocytes 01/20/2019 0  % Final   Abs Immature Granulocytes 01/20/2019 0.00  0.00 - 0.07 K/uL Final   Performed at [facility], [place] [address] [place],  [address]    (this displays the last labs from the last 3 days)  No results found for: TOTALPROTELP, ALBUMINELP, A1GS, A2GS, BETS, BETA2SER, GAMS, MSPIKE, SPEI (this displays SPEP labs)  No results found for: KPAFRELGTCHN, LAMBDASER, KAPLAMBRATIO (kappa/lambda light chains)  No results found for: HGBA, HGBA2QUANT, HGBFQUANT, HGBSQUAN (Hemoglobinopathy evaluation)   No results found for: LDH  No results found for: IRON, TIBC, IRONPCTSAT (Iron and TIBC)  No results found for: FERRITIN  Urinalysis    Component Value Date/Time   COLORURINE YELLOW 12/30/2018 0948   APPEARANCEUR CLEAR 12/30/2018 0948   LABSPEC 1.004 (L) 12/30/2018 0948   PHURINE 8.0 12/30/2018 [place] 12/30/2018 0948   HGBUR NEGATIVE 12/30/2018 [place] 12/30/2018 0948   KETONESUR NEGATIVE 12/30/2018 0948   PROTEINUR NEGATIVE 12/30/2018 0948   NITRITE NEGATIVE 12/30/2018 0948   LEUKOCYTESUR NEGATIVE 12/30/2018 0948     STUDIES: No results found.  ELIGIBLE FOR AVAILABLE RESEARCH PROTOCOL: no  ASSESSMENT: 58 y.o. [place] woman   (1) status post right lumpectomy in 1997,  (a) status post adjuvant radiation  (b) did not receive antiestrogens or chemotherapy  (2) status post right breast upper outer quadrant biopsy 09/23/2018 for a clinical T2 N0, stage IIA invasive  ductal carcinoma, grade 3, estrogen and progesterone receptor positive, HER-2 not amplified, with an MIB-1 of 70%  (3) Oncotype obtained from the initial biopsy showed a score of 26 predicting a risk of recurrence outside the breast of 16/21% (depending on nodal status), if the patient's only systemic therapy is an antiestrogen  for 5 years.  It also predicts a significant benefit from chemotherapy  (4) status post right mastectomy and sentinel lymph node sampling 10/22/2018 for a pT2 pN0, stage IB invasive ductal carcinoma, grade 2, with negative margins  (a) not planning reconstruction at this point  (5) adjuvant chemotherapy recommended on 11/05/2018 by Dr. Jana Hakim  (a) second opinion with Dr. Annabell Sabal at [facility] on 11/18/2018 recommended 4 cycles of adjuvant docetaxel and cyclophosphamide   (b) Third opinion with Dr. Adan Sis on 11/24/2018 recommended 4 cycles of adjuvant Docetaxel and Cyclophosphamide  (c) To start adjuvant chemotherapy with four cycles of Docetaxel and Cyclophosphamide on 12/23/2018  (6) anastrozole started 09/30/2018 (patient stopped due to concerns over bone loss--to restart once adjuvant therapy has completed)  (a) Bone density on 12/10/2018 shows a T score of -3.8 in the AP spine.    (7) genetics testing 10/06/2018 through the [facility] Common Hereditary Cancers Panel found no deleterious mutations in APC, ATM, AXIN2, BARD1, BMPR1A, BRCA1, BRCA2, BRIP1, CDH1, CDKN2A (p14ARF), CDKN2A (p16INK4a), CKD4, CHEK2, CTNNA1, DICER1, EPCAM (Deletion/duplication testing only), GREM1 (promoter region deletion/duplication testing only), KIT, MEN1, MLH1, MSH2, MSH3, MSH6, MUTYH, NBN, NF1, NHTL1, PALB2, PDGFRA, PMS2, POLD1, POLE, PTEN, RAD50, RAD51C, RAD51D, SDHB, SDHC, SDHD, SMAD4, SMARCA4. STK11, TP53, TSC1, TSC2, and VHL. The following genes were evaluated for sequence changes only: SDHA and HOXB13 c.251G>A variant only.   (a) 2 VUS's identified: VUS in APC c.-34014C>T and VHL c.340+272T>G identified   PLAN: Tawan is doing well today.  She is tolerating chemotherapy well.  She is mildly neutropenic today and I reviewed neutropenic precautions with her in detail.    I sent in some Lotrisone cream to apply to her rashes.  I am unclear what these are, but giving their limited nature, I do not think they are related to  the chemotherapy.    Ashonti and I reviewed her taste changes and I recommended that she put a drop of vinegar in her food when cooking to help with flavoring.    I talked her through the side effects she is experiencing, along with each of her lab values in detail.  I gave her a lot of reassurance today.    We will see her back in 2 weeks for labs and f/u, along with her next treatment.  She was recommended to continue with the appropriate pandemic precautions. She knows to call for any questions that may arise between now and her next appointment.  We are happy to see her sooner if needed.   A total of (40) minutes of face-to-face time was spent with this patient with greater than 50% of that time in counseling and care-coordination.   Wilber Bihari, NP   01/20/2019 1:41 PM Medical Oncology and Hematology [facility] [address] [place], [place] [address] Tel. [phone]    Fax. [phone]

## 2019-01-20 ENCOUNTER — Encounter: Payer: Self-pay | Admitting: Adult Health

## 2019-01-20 ENCOUNTER — Inpatient Hospital Stay (HOSPITAL_BASED_OUTPATIENT_CLINIC_OR_DEPARTMENT_OTHER): Payer: BC Managed Care – PPO | Admitting: Adult Health

## 2019-01-20 ENCOUNTER — Inpatient Hospital Stay: Payer: BC Managed Care – PPO

## 2019-01-20 ENCOUNTER — Other Ambulatory Visit: Payer: Self-pay

## 2019-01-20 VITALS — BP 98/80 | HR 101 | Temp 98.3°F | Resp 18 | Ht 64.0 in | Wt 172.1 lb

## 2019-01-20 DIAGNOSIS — D709 Neutropenia, unspecified: Secondary | ICD-10-CM | POA: Diagnosis not present

## 2019-01-20 DIAGNOSIS — Z170 Estrogen receptor positive status [ER+]: Secondary | ICD-10-CM | POA: Diagnosis not present

## 2019-01-20 DIAGNOSIS — R1012 Left upper quadrant pain: Secondary | ICD-10-CM | POA: Diagnosis not present

## 2019-01-20 DIAGNOSIS — C50411 Malignant neoplasm of upper-outer quadrant of right female breast: Secondary | ICD-10-CM

## 2019-01-20 DIAGNOSIS — E860 Dehydration: Secondary | ICD-10-CM | POA: Diagnosis not present

## 2019-01-20 DIAGNOSIS — Z5189 Encounter for other specified aftercare: Secondary | ICD-10-CM | POA: Diagnosis not present

## 2019-01-20 DIAGNOSIS — R21 Rash and other nonspecific skin eruption: Secondary | ICD-10-CM | POA: Diagnosis not present

## 2019-01-20 DIAGNOSIS — K219 Gastro-esophageal reflux disease without esophagitis: Secondary | ICD-10-CM | POA: Diagnosis not present

## 2019-01-20 DIAGNOSIS — Z79811 Long term (current) use of aromatase inhibitors: Secondary | ICD-10-CM | POA: Diagnosis not present

## 2019-01-20 DIAGNOSIS — Z79899 Other long term (current) drug therapy: Secondary | ICD-10-CM | POA: Diagnosis not present

## 2019-01-20 DIAGNOSIS — Z5111 Encounter for antineoplastic chemotherapy: Secondary | ICD-10-CM | POA: Diagnosis not present

## 2019-01-20 LAB — CBC WITH DIFFERENTIAL/PLATELET
Abs Immature Granulocytes: 0 10*3/uL (ref 0.00–0.07)
Basophils Absolute: 0.1 10*3/uL (ref 0.0–0.1)
Basophils Relative: 3 %
Eosinophils Absolute: 0 10*3/uL (ref 0.0–0.5)
Eosinophils Relative: 0 %
HCT: 33.3 % — ABNORMAL LOW (ref 36.0–46.0)
Hemoglobin: 11.1 g/dL — ABNORMAL LOW (ref 12.0–15.0)
Immature Granulocytes: 0 %
Lymphocytes Relative: 48 %
Lymphs Abs: 0.8 10*3/uL (ref 0.7–4.0)
MCH: 31.8 pg (ref 26.0–34.0)
MCHC: 33.3 g/dL (ref 30.0–36.0)
MCV: 95.4 fL (ref 80.0–100.0)
Monocytes Absolute: 0.2 10*3/uL (ref 0.1–1.0)
Monocytes Relative: 11 %
Neutro Abs: 0.6 10*3/uL — ABNORMAL LOW (ref 1.7–7.7)
Neutrophils Relative %: 38 %
Platelets: 262 10*3/uL (ref 150–400)
RBC: 3.49 MIL/uL — ABNORMAL LOW (ref 3.87–5.11)
RDW: 13.9 % (ref 11.5–15.5)
WBC: 1.7 10*3/uL — ABNORMAL LOW (ref 4.0–10.5)
nRBC: 0 % (ref 0.0–0.2)

## 2019-01-20 LAB — COMPREHENSIVE METABOLIC PANEL
ALT: 19 U/L (ref 0–44)
AST: 39 U/L (ref 15–41)
Albumin: 3.5 g/dL (ref 3.5–5.0)
Alkaline Phosphatase: 99 U/L (ref 38–126)
Anion gap: 7 (ref 5–15)
BUN: <4 mg/dL — ABNORMAL LOW (ref 6–20)
CO2: 26 mmol/L (ref 22–32)
Calcium: 9.1 mg/dL (ref 8.9–10.3)
Chloride: 107 mmol/L (ref 98–111)
Creatinine, Ser: 0.63 mg/dL (ref 0.44–1.00)
GFR calc Af Amer: >60 mL/min (ref >60)
GFR calc non Af Amer: >60 mL/min (ref >60)
Glucose, Bld: 99 mg/dL (ref 70–99)
Potassium: 4.1 mmol/L (ref 3.5–5.1)
Sodium: 140 mmol/L (ref 135–145)
Total Bilirubin: 0.3 mg/dL (ref 0.3–1.2)
Total Protein: 7 g/dL (ref 6.5–8.1)

## 2019-01-20 MED ORDER — CLOTRIMAZOLE-BETAMETHASONE 1-0.05 % EX CREA: 1.0000 "application " | TOPICAL_CREAM | Freq: Two times a day (BID) | CUTANEOUS | 0 refills | Status: DC

## 2019-01-22 ENCOUNTER — Encounter: Payer: Self-pay | Admitting: *Deleted

## 2019-01-22 DIAGNOSIS — D259 Leiomyoma of uterus, unspecified: Secondary | ICD-10-CM | POA: Diagnosis not present

## 2019-01-22 DIAGNOSIS — R102 Pelvic and perineal pain: Secondary | ICD-10-CM | POA: Diagnosis not present

## 2019-01-27 ENCOUNTER — Other Ambulatory Visit: Payer: Self-pay

## 2019-01-27 DIAGNOSIS — R9389 Abnormal findings on diagnostic imaging of other specified body structures: Secondary | ICD-10-CM | POA: Diagnosis not present

## 2019-01-27 DIAGNOSIS — N840 Polyp of corpus uteri: Secondary | ICD-10-CM | POA: Diagnosis not present

## 2019-02-03 ENCOUNTER — Encounter: Payer: Self-pay | Admitting: Adult Health

## 2019-02-03 ENCOUNTER — Inpatient Hospital Stay: Payer: BC Managed Care – PPO | Attending: Oncology

## 2019-02-03 ENCOUNTER — Inpatient Hospital Stay (HOSPITAL_BASED_OUTPATIENT_CLINIC_OR_DEPARTMENT_OTHER): Payer: BC Managed Care – PPO | Admitting: Medical

## 2019-02-03 ENCOUNTER — Other Ambulatory Visit: Payer: Self-pay

## 2019-02-03 ENCOUNTER — Inpatient Hospital Stay: Payer: BC Managed Care – PPO

## 2019-02-03 ENCOUNTER — Inpatient Hospital Stay: Payer: BC Managed Care – PPO | Admitting: Adult Health

## 2019-02-03 VITALS — BP 98/58 | HR 95 | Temp 98.9°F | Resp 18 | Ht 64.0 in | Wt 175.1 lb

## 2019-02-03 DIAGNOSIS — C50411 Malignant neoplasm of upper-outer quadrant of right female breast: Secondary | ICD-10-CM

## 2019-02-03 DIAGNOSIS — Z5189 Encounter for other specified aftercare: Secondary | ICD-10-CM | POA: Diagnosis not present

## 2019-02-03 DIAGNOSIS — E860 Dehydration: Secondary | ICD-10-CM | POA: Diagnosis not present

## 2019-02-03 DIAGNOSIS — Z170 Estrogen receptor positive status [ER+]: Secondary | ICD-10-CM | POA: Diagnosis not present

## 2019-02-03 DIAGNOSIS — Z5111 Encounter for antineoplastic chemotherapy: Secondary | ICD-10-CM | POA: Insufficient documentation

## 2019-02-03 LAB — CBC WITH DIFFERENTIAL/PLATELET
Abs Immature Granulocytes: 0.02 10*3/uL (ref 0.00–0.07)
Basophils Absolute: 0.1 10*3/uL (ref 0.0–0.1)
Basophils Relative: 1 %
Eosinophils Absolute: 0 10*3/uL (ref 0.0–0.5)
Eosinophils Relative: 0 %
HCT: 34.9 % — ABNORMAL LOW (ref 36.0–46.0)
Hemoglobin: 11.4 g/dL — ABNORMAL LOW (ref 12.0–15.0)
Immature Granulocytes: 0 %
Lymphocytes Relative: 18 %
Lymphs Abs: 1.3 10*3/uL (ref 0.7–4.0)
MCH: 32.3 pg (ref 26.0–34.0)
MCHC: 32.7 g/dL (ref 30.0–36.0)
MCV: 98.9 fL (ref 80.0–100.0)
Monocytes Absolute: 0.6 10*3/uL (ref 0.1–1.0)
Monocytes Relative: 9 %
Neutro Abs: 5.1 10*3/uL (ref 1.7–7.7)
Neutrophils Relative %: 72 %
Platelets: 337 10*3/uL (ref 150–400)
RBC: 3.53 MIL/uL — ABNORMAL LOW (ref 3.87–5.11)
RDW: 15.8 % — ABNORMAL HIGH (ref 11.5–15.5)
WBC: 7.1 10*3/uL (ref 4.0–10.5)
nRBC: 0 % (ref 0.0–0.2)

## 2019-02-03 LAB — COMPREHENSIVE METABOLIC PANEL
ALT: 9 U/L (ref 0–44)
AST: 22 U/L (ref 15–41)
Albumin: 3 g/dL — ABNORMAL LOW (ref 3.5–5.0)
Alkaline Phosphatase: 87 U/L (ref 38–126)
Anion gap: 9 (ref 5–15)
BUN: <4 mg/dL — ABNORMAL LOW (ref 6–20)
CO2: 22 mmol/L (ref 22–32)
Calcium: 8.5 mg/dL — ABNORMAL LOW (ref 8.9–10.3)
Chloride: 109 mmol/L (ref 98–111)
Creatinine, Ser: 0.61 mg/dL (ref 0.44–1.00)
GFR calc Af Amer: >60 mL/min (ref >60)
GFR calc non Af Amer: >60 mL/min (ref >60)
Glucose, Bld: 74 mg/dL (ref 70–99)
Potassium: 4 mmol/L (ref 3.5–5.1)
Sodium: 140 mmol/L (ref 135–145)
Total Bilirubin: 0.4 mg/dL (ref 0.3–1.2)
Total Protein: 6 g/dL — ABNORMAL LOW (ref 6.5–8.1)

## 2019-02-03 MED ORDER — SODIUM CHLORIDE 0.9 % IV SOLN
75.0000 mg/m2 | Freq: Once | INTRAVENOUS | Status: AC
  Administered 2019-02-03: 150 mg via INTRAVENOUS
  Filled 2019-02-03: qty 15

## 2019-02-03 MED ORDER — COLD PACK MISC ONCOLOGY
1.0000 | Freq: Once | Status: DC | PRN
  Filled 2019-02-03: qty 1

## 2019-02-03 MED ORDER — PALONOSETRON HCL INJECTION 0.25 MG/5ML
INTRAVENOUS | Status: AC
  Filled 2019-02-03: qty 5

## 2019-02-03 MED ORDER — SODIUM CHLORIDE 0.9 % IV SOLN
600.0000 mg/m2 | Freq: Once | INTRAVENOUS | Status: AC
  Administered 2019-02-03: 16:00:00 1180 mg via INTRAVENOUS
  Filled 2019-02-03: qty 59

## 2019-02-03 MED ORDER — DEXAMETHASONE SODIUM PHOSPHATE 10 MG/ML IJ SOLN
INTRAMUSCULAR | Status: AC
  Filled 2019-02-03: qty 1

## 2019-02-03 MED ORDER — PALONOSETRON HCL INJECTION 0.25 MG/5ML
0.2500 mg | Freq: Once | INTRAVENOUS | Status: AC
  Administered 2019-02-03: 10:00:00 0.25 mg via INTRAVENOUS

## 2019-02-03 MED ORDER — DEXAMETHASONE SODIUM PHOSPHATE 10 MG/ML IJ SOLN
10.0000 mg | Freq: Once | INTRAMUSCULAR | Status: AC
  Administered 2019-02-03: 10 mg via INTRAVENOUS

## 2019-02-03 MED ORDER — SODIUM CHLORIDE 0.9 % IV SOLN
Freq: Once | INTRAVENOUS | Status: AC
  Administered 2019-02-03: 10:00:00 via INTRAVENOUS
  Filled 2019-02-03: qty 250

## 2019-02-03 NOTE — Patient Instructions (Signed)
[facility] Discharge Instructions for Patients Receiving Chemotherapy  Today you received the following chemotherapy agents Docetaxel (TAXOTERE) & Cyclophosphamide (CYTOXAN).  To help prevent nausea and vomiting after your treatment, we encourage you to take your nausea medication as prescribed.   If you develop nausea and vomiting that is not controlled by your nausea medication, call the clinic.   BELOW ARE SYMPTOMS THAT SHOULD BE REPORTED IMMEDIATELY:  *FEVER GREATER THAN 100.5 F  *CHILLS WITH OR WITHOUT FEVER  NAUSEA AND VOMITING THAT IS NOT CONTROLLED WITH YOUR NAUSEA MEDICATION  *UNUSUAL SHORTNESS OF BREATH  *UNUSUAL BRUISING OR BLEEDING  TENDERNESS IN MOUTH AND THROAT WITH OR WITHOUT PRESENCE OF ULCERS  *URINARY PROBLEMS  *BOWEL PROBLEMS  UNUSUAL RASH Items with * indicate a potential emergency and should be followed up as soon as possible.  Feel free to call the clinic should you have any questions or concerns. The clinic phone number is [phone].  Please show the [facility] at check-in to the Emergency Department and triage nurse.  Coronavirus (COVID-19) Are you at risk?  Are you at risk for the Coronavirus (COVID-19)?  To be considered HIGH RISK for Coronavirus (COVID-19), you have to meet the following criteria:  . Traveled to [place], [place], [place], [place] or [place]; or in the [place] to [place], [place], [place], or [place]; and have fever, cough, and shortness of breath within the last 2 weeks of travel OR . Been in close contact with a person diagnosed with COVID-19 within the last 2 weeks and have fever, cough, and shortness of breath . IF YOU DO NOT MEET THESE CRITERIA, YOU ARE CONSIDERED LOW RISK FOR COVID-19.  What to do if you are HIGH RISK for COVID-19?  Marland Kitchen If you are having a medical emergency, call 911. . Seek medical care right away. Before you go to a doctor's office, urgent care or emergency  department, call ahead and tell them about your recent travel, contact with someone diagnosed with COVID-19, and your symptoms. You should receive instructions from your physician's office regarding next steps of care.  . When you arrive at healthcare provider, tell the healthcare staff immediately you have returned from visiting [place], [place], [place], [place] or [place]; or traveled in the [place] to [place], [place], [place], or [place]; in the last two weeks or you have been in close contact with a person diagnosed with COVID-19 in the last 2 weeks.   . Tell the health care staff about your symptoms: fever, cough and shortness of breath. . After you have been seen by a medical provider, you will be either: o Tested for (COVID-19) and discharged home on quarantine except to seek medical care if symptoms worsen, and asked to  - Stay home and avoid contact with others until you get your results (4-5 days)  - Avoid travel on public transportation if possible (such as bus, train, or airplane) or o Sent to the Emergency Department by EMS for evaluation, COVID-19 testing, and possible admission depending on your condition and test results.  What to do if you are LOW RISK for COVID-19?  Reduce your risk of any infection by using the same precautions used for avoiding the common cold or flu:  Marland Kitchen Wash your hands often with soap and warm water for at least 20 seconds.  If soap and water are not readily available, use an alcohol-based hand sanitizer with at least 60% alcohol.  Marland Kitchen  If coughing or sneezing, cover your mouth and nose by coughing or sneezing into the elbow areas of your shirt or coat, into a tissue or into your sleeve (not your hands). . Avoid shaking hands with others and consider head nods or verbal greetings only. . Avoid touching your eyes, nose, or mouth with unwashed hands.  . Avoid close contact with people who are sick. . Avoid places or events with large numbers of people  in one location, like concerts or sporting events. . Carefully consider travel plans you have or are making. . If you are planning any travel outside or inside the [place], visit the CDC's Travelers' Health webpage for the latest health notices. . If you have some symptoms but not all symptoms, continue to monitor at home and seek medical attention if your symptoms worsen. . If you are having a medical emergency, call 911.   ADDITIONAL HEALTHCARE OPTIONS FOR PATIENTS  [facility] / e-Visit: [email or web address]         [facility]: [phone]  [facility]: [phone]                   [facility]: [phone]   

## 2019-02-03 NOTE — Patient Instructions (Signed)
Protein Content in Foods  Generally, most healthy people need around 50 grams of protein each day. Depending on your overall health, you may need more or less protein in your diet. Talk to your health care provider or dietitian about how much protein you need. See the following list for the protein content of some common foods. High-protein foods High-protein foods contain 4 grams (4 g) or more of protein per serving. They include:  Beef, ground sirloin (cooked) - 3 oz have 24 g of protein.  Cheese (hard) - 1 oz has 7 g of protein.  Chicken breast, boneless and skinless (cooked) - 3 oz have 13.4 g of protein.  Cottage cheese - 1/2 cup has 13.4 g of protein.  Egg - 1 egg has 6 g of protein.  Fish, filet (cooked) - 1 oz has 6-7 g of protein.  Garbanzo beans (canned or cooked) - 1/2 cup has 6-7 g of protein.  Kidney beans (canned or cooked) - 1/2 cup has 6-7 g of protein.  Lamb (cooked) - 3 oz has 24 g of protein.  Milk - 1 cup (8 oz) has 8 g of protein.  Nuts (peanuts, pistachios, almonds) - 1 oz has 6 g of protein.  Peanut butter - 1 oz has 7-8 g of protein.  Pork tenderloin (cooked) - 3 oz has 18.4 g of protein.  Pumpkin seeds - 1 oz has 8.5 g of protein.  Soybeans (roasted) - 1 oz has 8 g of protein.  Soybeans (cooked) - 1/2 cup has 11 g of protein.  Soy milk - 1 cup (8 oz) has 5-10 g of protein.  Soy or vegetable patty - 1 patty has 11 g of protein.  Sunflower seeds - 1 oz has 5.5 g of protein.  Tofu (firm) - 1/2 cup has 20 g of protein.  Tuna (canned in water) - 3 oz has 20 g of protein.  Yogurt - 6 oz has 8 g of protein. Low-protein foods Low-protein foods contain 3 grams (3 g) or less of protein per serving. They include:  Beets (raw or cooked) - 1/2 cup has 1.5 g of protein.  Bran cereal - 1/2 cup has 2-3 g of protein.  Bread - 1 slice has 2.5 g of protein.  Broccoli (raw or cooked) - 1/2 cup has 2 g of protein.  Collard greens (raw or cooked) - 1/2  cup has 2 g of protein.  Corn (fresh or cooked) - 1/2 cup has 2 g of protein.  Cream cheese - 1 oz has 2 g of protein.  Creamer (half-and-half) - 1 oz has 1 g of protein.  Flour tortilla - 1 tortilla has 2.5 g of protein  Frozen yogurt - 1/2 cup has 3 g of protein.  Fruit or vegetable juice - 1/2 cup has 1 g of protein.  Green beans (raw or cooked) - 1/2 cup has 1 g of protein.  Green peas (canned) - 1/2 cup has 3.5 g of protein.  Muffins - 1 small muffin (2 oz) has 3 g of protein.  Oatmeal (cooked) - 1/2 cup has 3 g of protein.  Potato (baked with skin) - 1 medium potato has 3 g of protein.  Rice (cooked) - 1/2 cup has 2.5-3.5 g of protein.  Sour cream - 1/2 cup has 2.5 g of protein.  Spinach (cooked) - 1/2 cup has 3 g of protein.  Squash (cooked) - 1/2 cup has 1.5 g of protein. Actual amounts of protein may  be different depending on processing. Talk with your health care provider or dietitian about what foods are recommended for you. This information is not intended to replace advice given to you by your health care provider. Make sure you discuss any questions you have with your health care provider. Document Released: 07/15/2015 Document Revised: 12/25/2015 Document Reviewed: 12/25/2015 Elsevier Patient Education  2020 [employer].  

## 2019-02-03 NOTE — Progress Notes (Signed)
[facility]  [phone] [phone]     ID: Alice [name] DOB: 01-03-1961  MR#: [id number]  FSE#:[id number]  Patient Care Team: [name], MD as PCP - General (Family Medicine) Mauro Kaufmann, RN as Oncology Nurse Navigator Rockwell Germany, RN as Oncology Nurse Navigator Magrinat, Virgie Dad, MD as Consulting Physician (Oncology) Kyung Rudd, MD as Consulting Physician (Radiation Oncology) Rolm Bookbinder, MD as Consulting Physician (General Surgery) Earnstine Regal, PA-C as Consulting Physician (Obstetrics and Gynecology) Juanita Craver, MD as Consulting Physician (Gastroenterology) [name], MD as Consulting Physician (Family Medicine) Cristine Polio, MD as Consulting Physician (Plastic Surgery) Scot Dock, NP OTHER MD:    CHIEF COMPLAINT: Estrogen receptor positive breast cancer  CURRENT TREATMENT: Adjuvant chemotherapy  INTERVAL HISTORY: Alice [name] was seen today for follow up of her estrogen receptor positive breast cancer.  She is here today after starting adjuvant chemotherapy with Docetaxel and Cyclophosphamide given every 21 days x 4 cycles.  This is with Neulasta support.   Today is cycle 3 day 1 of treatment.    REVIEW OF SYSTEMS: Alice [name] is doing well today.  She denies any new issues such as fever, chills, cough, shortness of breath, chest pain, or palpitations.  She is having normal bowel movements.  She denies peripheral neuropathy.  Alice [name] does remain fatigued and notes she does feel weaker sometimes in her legs.  Otherwise, she is doing well and is without any questions or concerns.    HISTORY OF CURRENT ILLNESS: From the original intake note:  Alice [name] has a history of remote right breast cancer in 1997, treated with right lumpectomy and radiation in [place].  The patient did not receive chemotherapy or antiestrogen treatments.  More recently she presented for her annual  mammogram with a non-tender area of firmness in the right breast at 9 o'clock for 2 months. She underwent bilateral diagnostic mammography with tomography and right breast ultrason[facility] on 09/08/2018 showing: breast density category A; scattered-appearing fibroglandular tissue at the lumpectomy site in the upper outer right breast middle depth spanning 3.4 cm, with indistinct margins, was not seen on prior mammogram from 2018.  There was also a keloid scar in the inferior medial left breast.  On physical exam there was a rectangular area of hardness measuring up to 7 cm in the right breast at 9:00, corresponding with the prior lumpectomy and consistent with radiation change.  Ultrasound of this area showed only postlumpectomy change, with no suspicious or discrete mass.  Ultrasound of the axilla was benign  Accordingly on 09/23/2018 she proceeded to biopsy of the right breast area in question. The pathology from this procedure [facility] 20-07/02/2000 showed: invasive ductal carcinoma, E-cadherin positive,  grade 3,  Prognostic indicators significant for: estrogen receptor at 100% positive and progesterone receptor, 30% positive, both with strong staining intensity proliferation marker Ki67 at 70%. HER2 negative by immunohistochemistry (0).  The patient's subsequent history is as detailed below.   PAST MEDICAL HISTORY: Past Medical History:  Diagnosis Date   Anemia    Breast cancer ([place])    right in 1997   Family history of lung cancer    Family history of ovarian cancer    GERD (gastroesophageal reflux disease) 06/30/2018   Herpes simplex type 2 infection 02/2006   History of breast cancer 1997   History of esophagogastroduodenoscopy (EGD) 09/15/2015   small hiatal hernia was noted otherwise all was normal   Personal history of breast cancer  PAST SURGICAL HISTORY: Past Surgical History:  Procedure Laterality Date   BREAST ENHANCEMENT SURGERY     s/p cancer and surgery,  right   BREAST LUMPECTOMY     right due to breast cancer   MASTECTOMY W/ SENTINEL NODE BIOPSY Right 10/22/2018   Procedure: RIGHT MASTECTOMY WITH RIGHT AXILLARY SENTINEL LYMPH NODE BIOPSY;  Surgeon: Rolm Bookbinder, MD;  Location: [facility];  Service: General;  Laterality: Right;  wisdom teeth removal   FAMILY HISTORY: Family History  Problem Relation Age of Onset   Ovarian cancer Sister 75   Dementia Mother    Aneurysm Father    Patient's father was 33 years old when he died from aneurysm. Patient's mother died from dementia at age 10. Her sister was diagnosed with ovarian cancer at age 41, and this subsequently took her life.  In total of the patient has 5 brothers and 2 sisters.  She is not aware of other cancers in the family  GYNECOLOGIC HISTORY:  No LMP recorded. Patient is postmenopausal. Menarche: 58 years old Alice [name] P 0 LMP age 17 Contraceptive none HRT none  Hysterectomy? no BSO? 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 She is married. Husband Edmonia Caprio is from [place], so his native language is [place]. She lives at home with her husband. They have no children. She attends a [employer] in [place].    ADVANCED DIRECTIVES: In the absence of documents to the contrary her husband is automatically her HCPOA.   HEALTH MAINTENANCE: Social History   Tobacco Use   Smoking status: Never Smoker   Smokeless tobacco: Never Used  Substance Use Topics   Alcohol use: No    Alcohol/week: 0.0 standard drinks   Drug use: No     Colonoscopy: endoscopy in 2017, colonoscopy due 2020 but delayed due to virus  PAP: 12/03/2017, normal  Bone density: never done   No Known Allergies  Current Outpatient Medications  Medication Sig Dispense Refill   anastrozole (ARIMIDEX) 1 MG tablet Take 1 tablet (1 mg total) by mouth daily. 90 tablet 4   ciprofloxacin (CIPRO) 500 MG tablet Take 1  tablet (500 mg total) by mouth 2 (two) times daily. 10 tablet 0   clotrimazole-betamethasone (LOTRISONE) cream Apply 1 application topically 2 (two) times daily. 30 g 0   dexamethasone (DECADRON) 4 MG tablet Take 2 tablets (8 mg total) by mouth 2 (two) times daily. Start the day before Taxotere. Then again the day after chemo for 3 days. 30 tablet 1   lidocaine-prilocaine (EMLA) cream Apply to affected area once 30 g 3   LORazepam (ATIVAN) 0.5 MG tablet Take 1 tablet (0.5 mg total) by mouth at bedtime as needed (Nausea or vomiting). 30 tablet 0   omeprazole (PRILOSEC) 40 MG capsule Take 1 capsule (40 mg total) by mouth daily. 30 capsule 5   oxyCODONE-acetaminophen (PERCOCET/ROXICET) 5-325 MG tablet Take 1 tablet by mouth every 6 (six) hours as needed for severe pain. 15 tablet 0   prochlorperazine (COMPAZINE) 10 MG tablet Take 1 tablet (10 mg total) by mouth every 6 (six) hours as needed (Nausea or vomiting). 30 tablet 1   No current facility-administered medications for this visit.    Facility-Administered Medications Ordered in Other Visits  Medication Dose Route Frequency Provider Last Rate Last Dose   cyclophosphamide (CYTOXAN) 1,180 mg in sodium chloride 0.9 % 250 mL chemo infusion  600 mg/m2 (Order-Specific) Intravenous  Once Gardenia Phlegm, NP       DOCEtaxel (TAXOTERE) 150 mg in sodium chloride 0.9 % 250 mL chemo infusion  75 mg/m2 (Order-Specific) Intravenous Once Keagon Glascoe, Charlestine Massed, NP        OBJECTIVE:  BP (!) 98/58 (BP Location: Left Arm, Patient Position: Sitting)    Pulse 95    Temp 98.9 F (37.2 C) (Oral)    Resp 18    Ht _0  (1.626 m)    Wt 175 lb 1.6 oz (79.4 kg)    SpO2 100%    BMI 30.06 kg/m  Filed Weights   02/03/19 0920  Weight: 175 lb 1.6 oz (79.4 kg)   ECOG: 1 GENERAL: Patient appears well and she is in no apparent distress HEENT:  Sclerae anicteric.  Oropharynx clear and moist. No ulcerations or evidence of oropharyngeal candidiasis. Neck  is supple.  NODES:  No cervical, supraclavicular, or axillary lymphadenopathy palpated.  BREAST EXAM: deferred. LUNGS:  Clear to auscultation bilaterally.  No wheezes or rhonchi. HEART:  Regular rate and rhythm. No murmur appreciated. ABDOMEN:  Soft, nontender.  Positive, normoactive bowel sounds. No organomegaly palpated. MSK:  No focal spinal tenderness to palpation. Full range of motion bilaterally in the upper extremities. EXTREMITIES:  No peripheral edema.   SKIN: without rash or lesion NEURO:  Nonfocal. Well oriented.  Anxious    LAB RESULTS:  CMP     Component Value Date/Time   NA 140 02/03/2019 0831   K 4.0 02/03/2019 0831   CL 109 02/03/2019 0831   CO2 22 02/03/2019 0831   GLUCOSE 74 02/03/2019 0831   BUN <4 (L) 02/03/2019 0831   CREATININE 0.61 02/03/2019 0831   CREATININE 0.73 09/30/2018 0819   CALCIUM 8.5 (L) 02/03/2019 0831   PROT 6.0 (L) 02/03/2019 0831   ALBUMIN 3.0 (L) 02/03/2019 0831   AST 22 02/03/2019 0831   AST 20 09/30/2018 0819   ALT 9 02/03/2019 0831   ALT 6 09/30/2018 0819   ALKPHOS 87 02/03/2019 0831   BILITOT 0.4 02/03/2019 0831   BILITOT 0.5 09/30/2018 0819   GFRNONAA >60 02/03/2019 0831   GFRNONAA >60 09/30/2018 0819   GFRAA >60 02/03/2019 0831   GFRAA >60 09/30/2018 0819    No results found for: TOTALPROTELP, ALBUMINELP, A1GS, A2GS, BETS, BETA2SER, GAMS, MSPIKE, SPEI  No results found for: KPAFRELGTCHN, LAMBDASER, [facility]  Lab Results  Component Value Date   WBC 7.1 02/03/2019   NEUTROABS 5.1 02/03/2019   HGB 11.4 (L) 02/03/2019   HCT 34.9 (L) 02/03/2019   MCV 98.9 02/03/2019   PLT 337 02/03/2019    _1 @  No results found for: LABCA2  No components found for: [id number]  No results for input(s): INR in the last 168 hours.  No results found for: LABCA2  No results found for: HYI502  No results found for: DXA128  No results found for: NOM767  No results found for: CA2729  No components found for:  HGQUANT  No results found for: CEA1 / No results found for: CEA1   No results found for: AFPTUMOR  No results found for: CHROMOGRNA  No results found for: PSA1  Appointment on 02/03/2019  Component Date Value Ref Range Status   Sodium 02/03/2019 140  135 - 145 mmol/L Final   Potassium 02/03/2019 4.0  3.5 - 5.1 mmol/L Final   Chloride 02/03/2019 109  98 - 111 mmol/L Final   CO2 02/03/2019 22  22 - 32 mmol/L Final   Glucose, Bld  02/03/2019 74  70 - 99 mg/dL Final   BUN 02/03/2019 <4* 6 - 20 mg/dL Final   Creatinine, Ser 02/03/2019 0.61  0.44 - 1.00 mg/dL Final   Calcium 02/03/2019 8.5* 8.9 - 10.3 mg/dL Final   Total Protein 02/03/2019 6.0* 6.5 - 8.1 g/dL Final   Albumin 02/03/2019 3.0* 3.5 - 5.0 g/dL Final   AST 02/03/2019 22  15 - 41 U/L Final   ALT 02/03/2019 9  0 - 44 U/L Final   Alkaline Phosphatase 02/03/2019 87  38 - 126 U/L Final   Total Bilirubin 02/03/2019 0.4  0.3 - 1.2 mg/dL Final   GFR calc non Af Amer 02/03/2019 >60  >60 mL/min Final   GFR calc Af Amer 02/03/2019 >60  >60 mL/min Final   Anion gap 02/03/2019 9  5 - 15 Final   Performed at [facility], [place] [address], [place], [place] [address]   WBC 02/03/2019 7.1  4.0 - 10.5 K/uL Final   RBC 02/03/2019 3.53* 3.87 - 5.11 MIL/uL Final   Hemoglobin 02/03/2019 11.4* 12.0 - 15.0 g/dL Final   HCT 02/03/2019 34.9* 36.0 - 46.0 % Final   MCV 02/03/2019 98.9  80.0 - 100.0 fL Final   MCH 02/03/2019 32.3  26.0 - 34.0 pg Final   MCHC 02/03/2019 32.7  30.0 - 36.0 g/dL Final   RDW 02/03/2019 15.8* 11.5 - 15.5 % Final   Platelets 02/03/2019 337  150 - 400 K/uL Final   nRBC 02/03/2019 0.0  0.0 - 0.2 % Final   Neutrophils Relative % 02/03/2019 72  % Final   Neutro Abs 02/03/2019 5.1  1.7 - 7.7 K/uL Final   Lymphocytes Relative 02/03/2019 18  % Final   Lymphs Abs 02/03/2019 1.3  0.7 - 4.0 K/uL Final   Monocytes Relative 02/03/2019 9  % Final   Monocytes Absolute  02/03/2019 0.6  0.1 - 1.0 K/uL Final   Eosinophils Relative 02/03/2019 0  % Final   Eosinophils Absolute 02/03/2019 0.0  0.0 - 0.5 K/uL Final   Basophils Relative 02/03/2019 1  % Final   Basophils Absolute 02/03/2019 0.1  0.0 - 0.1 K/uL Final   Immature Granulocytes 02/03/2019 0  % Final   Abs Immature Granulocytes 02/03/2019 0.02  0.00 - 0.07 K/uL Final   Performed at [facility], [place] [address] [employer], [place] [address]    (this displays the last labs from the last 3 days)  No results found for: TOTALPROTELP, ALBUMINELP, A1GS, A2GS, BETS, BETA2SER, GAMS, MSPIKE, SPEI (this displays SPEP labs)  No results found for: KPAFRELGTCHN, LAMBDASER, KAPLAMBRATIO (kappa/lambda light chains)  No results found for: HGBA, HGBA2QUANT, HGBFQUANT, HGBSQUAN (Hemoglobinopathy evaluation)   No results found for: LDH  No results found for: IRON, TIBC, IRONPCTSAT (Iron and TIBC)  No results found for: FERRITIN  Urinalysis    Component Value Date/Time   COLORURINE YELLOW 12/30/2018 0948   APPEARANCEUR CLEAR 12/30/2018 0948   LABSPEC 1.004 (L) 12/30/2018 0948   PHURINE 8.0 12/30/2018 [place] 12/30/2018 0948   HGBUR NEGATIVE 12/30/2018 [place] 12/30/2018 0948   KETONESUR NEGATIVE 12/30/2018 0948   PROTEINUR NEGATIVE 12/30/2018 0948   NITRITE NEGATIVE 12/30/2018 0948   LEUKOCYTESUR NEGATIVE 12/30/2018 0948     STUDIES: No results found.  ELIGIBLE FOR AVAILABLE RESEARCH PROTOCOL: no  ASSESSMENT: 58 y.o. [place] woman   (1) status post right lumpectomy in 1997,  (a) status post adjuvant radiation  (b)  did not receive antiestrogens or chemotherapy  (2) status post right breast upper outer quadrant biopsy 09/23/2018 for a clinical T2 N0, stage IIA invasive ductal carcinoma, grade 3, estrogen and progesterone receptor positive, HER-2 not amplified, with an MIB-1 of 70%  (3) Oncotype obtained from the initial biopsy  showed a score of 26 predicting a risk of recurrence outside the breast of 16/21% (depending on nodal status), if the patient's only systemic therapy is an antiestrogen for 5 years.  It also predicts a significant benefit from chemotherapy  (4) status post right mastectomy and sentinel lymph node sampling 10/22/2018 for a pT2 pN0, stage IB invasive ductal carcinoma, grade 2, with negative margins  (a) not planning reconstruction at this point  (5) adjuvant chemotherapy recommended on 11/05/2018 by Dr. Jana Hakim  (a) second opinion with Dr. Annabell Sabal at [facility] on 11/18/2018 recommended 4 cycles of adjuvant docetaxel and cyclophosphamide   (b) Third opinion with Dr. Adan Sis on 11/24/2018 recommended 4 cycles of adjuvant Docetaxel and Cyclophosphamide  (c) To start adjuvant chemotherapy with four cycles of Docetaxel and Cyclophosphamide on 12/23/2018  (6) anastrozole started 09/30/2018 (patient stopped due to concerns over bone loss--to restart once adjuvant therapy has completed)  (a) Bone density on 12/10/2018 shows a T score of -3.8 in the AP spine.    (7) genetics testing 10/06/2018 through the [facility] Common Hereditary Cancers Panel found no deleterious mutations in APC, ATM, AXIN2, BARD1, BMPR1A, BRCA1, BRCA2, BRIP1, CDH1, CDKN2A (p14ARF), CDKN2A (p16INK4a), CKD4, CHEK2, CTNNA1, DICER1, EPCAM (Deletion/duplication testing only), GREM1 (promoter region deletion/duplication testing only), KIT, MEN1, MLH1, MSH2, MSH3, MSH6, MUTYH, NBN, NF1, NHTL1, PALB2, PDGFRA, PMS2, POLD1, POLE, PTEN, RAD50, RAD51C, RAD51D, SDHB, SDHC, SDHD, SMAD4, SMARCA4. STK11, TP53, TSC1, TSC2, and VHL. The following genes were evaluated for sequence changes only: SDHA and HOXB13 c.251G>A variant only.   (a) 2 VUS's identified: VUS in APC c.-34014C>T and VHL c.340+272T>G identified   PLAN: Cadience is doing well today. Her labs are stable, and she is ready for her third cycle of chemotherapy today.  She does have some mild side effects  from the chemotherapy including mild weakness and fatigue, however she is doing quite well overall and we are proud of what she has accomplished.    Carlena has not developed peripheral neuropathy and we are monitoring her closely for that.  We did have a detailed discussion about the treatment and how she feels, along with diet and exercise.  Since she is vegan she is struggling with low soy protein options that she can eat.  She wants some suggestions on this.  I have placed a referral to nutrition about this.    Tyrica will return in 2 weeks for labs, f/u, and her final chemotherapy treatment.  She was recommended to continue with the appropriate pandemic precautions. She knows to call for any questions that may arise between now and her next appointment.  We are happy to see her sooner if needed.    A total of (30) minutes of face-to-face time was spent with this patient with greater than 50% of that time in counseling and care-coordination.   Wilber Bihari, NP   02/03/2019 11:25 AM Medical Oncology and Hematology [facility] [address] [place], [place] [address] Tel. [phone]    Fax. [phone]

## 2019-02-04 ENCOUNTER — Encounter: Payer: Self-pay | Admitting: Adult Health

## 2019-02-04 ENCOUNTER — Telehealth: Payer: Self-pay | Admitting: Oncology

## 2019-02-04 NOTE — Progress Notes (Signed)
The patient was seen in the infusion room today as she was receiving her Taxotere.  She reported having discomfort around her IV insertion site.  The area was examined.  There was no evidence to suggest extravasation.  The patient was reassured and was told to use cold packs to the area several times daily for the next 48 hours.  She was told to return for the area to be examined should she note increasing erythema, pain, or skin breakdown.  Patient expressed understanding and agreement with this plan.  The IV was discontinued with a new IV started with continuation and completion of chemotherapy today.  Sandi Mealy, MHS, PA-C Physician Assistant

## 2019-02-04 NOTE — Telephone Encounter (Signed)
I talk with patient regarding schedule  

## 2019-02-05 ENCOUNTER — Inpatient Hospital Stay: Payer: BC Managed Care – PPO | Admitting: Nutrition

## 2019-02-05 ENCOUNTER — Other Ambulatory Visit: Payer: Self-pay

## 2019-02-05 ENCOUNTER — Ambulatory Visit (HOSPITAL_COMMUNITY)
Admission: RE | Admit: 2019-02-05 | Discharge: 2019-02-05 | Disposition: A | Discharge status: Home / Self Care | Payer: BC Managed Care – PPO | Source: Ambulatory Visit | Attending: Internal Medicine | Admitting: Internal Medicine

## 2019-02-05 ENCOUNTER — Other Ambulatory Visit: Payer: Self-pay | Admitting: *Deleted

## 2019-02-05 ENCOUNTER — Inpatient Hospital Stay: Payer: BC Managed Care – PPO

## 2019-02-05 DIAGNOSIS — C50411 Malignant neoplasm of upper-outer quadrant of right female breast: Secondary | ICD-10-CM

## 2019-02-05 DIAGNOSIS — Z170 Estrogen receptor positive status [ER+]: Secondary | ICD-10-CM | POA: Insufficient documentation

## 2019-02-05 DIAGNOSIS — M545 Low back pain, unspecified: Secondary | ICD-10-CM

## 2019-02-05 DIAGNOSIS — Z5111 Encounter for antineoplastic chemotherapy: Secondary | ICD-10-CM | POA: Diagnosis not present

## 2019-02-05 DIAGNOSIS — Z5189 Encounter for other specified aftercare: Secondary | ICD-10-CM | POA: Diagnosis not present

## 2019-02-05 DIAGNOSIS — E860 Dehydration: Secondary | ICD-10-CM | POA: Diagnosis not present

## 2019-02-05 MED ORDER — PEGFILGRASTIM-JMDB 6 MG/0.6ML ~~LOC~~ SOSY
6.0000 mg | PREFILLED_SYRINGE | Freq: Once | SUBCUTANEOUS | Status: AC
  Administered 2019-02-05: 6 mg via SUBCUTANEOUS

## 2019-02-05 MED ORDER — OXYCODONE-ACETAMINOPHEN 5-325 MG PO TABS: 1.0000 | ORAL_TABLET | Freq: Once | ORAL | Status: DC

## 2019-02-05 MED ORDER — SODIUM CHLORIDE 0.9 % IV BOLUS
1000.0000 mL | Freq: Once | INTRAVENOUS | Status: AC
  Administered 2019-02-05: 1000 mL via INTRAVENOUS

## 2019-02-05 MED ORDER — OXYCODONE-ACETAMINOPHEN 5-325 MG PO TABS
1.0000 | ORAL_TABLET | Freq: Once | ORAL | Status: AC
  Administered 2019-02-05: 14:00:00 1 via ORAL

## 2019-02-05 MED ORDER — OXYCODONE-ACETAMINOPHEN 5-325 MG PO TABS
ORAL_TABLET | ORAL | Status: AC
  Filled 2019-02-05: qty 1

## 2019-02-05 NOTE — Patient Instructions (Signed)

## 2019-02-05 NOTE — Progress Notes (Signed)
PATIENT CARE CENTER NOTE   Provider:  Wilber Bihari, NP   Procedure: Fluid bolus   Note: Patient received a 1 liter bolus of 0.9% sodium chloride over 2 hours. Difficulty establishing IV access but once established patient tolerated bolus well. Vital signs stable. Discharge instructions given. Patient alert, oriented and ambulatory at discharge.

## 2019-02-05 NOTE — Progress Notes (Signed)
[place]

## 2019-02-05 NOTE — Discharge Instructions (Signed)
Dehydration, Adult  Dehydration is when there is not enough fluid or water in your body. This happens when you lose more fluids than you take in. Dehydration can range from mild to very bad. It should be treated right away to keep it from getting very bad. Symptoms of mild dehydration may include:  Thirst.  Dry lips.  Slightly dry mouth.  Dry, warm skin.  Dizziness. Symptoms of moderate dehydration may include:  Very dry mouth.  Muscle cramps.  Dark pee (urine). Pee may be the color of tea.  Your body making less pee.  Your eyes making fewer tears.  Heartbeat that is uneven or faster than normal (palpitations).  Headache.  Light-headedness, especially when you stand up from sitting.  Fainting (syncope). Symptoms of very bad dehydration may include:  Changes in skin, such as: ? Cold and clammy skin. ? Blotchy (mottled) or pale skin. ? Skin that does not quickly return to normal after being lightly pinched and let go (poor skin turgor).  Changes in body fluids, such as: ? Feeling very thirsty. ? Your eyes making fewer tears. ? Not sweating when body temperature is high, such as in hot weather. ? Your body making very little pee.  Changes in vital signs, such as: ? Weak pulse. ? Pulse that is more than 100 beats a minute when you are sitting still. ? Fast breathing. ? Low blood pressure.  Other changes, such as: ? Sunken eyes. ? Cold hands and feet. ? Confusion. ? Lack of energy (lethargy). ? Trouble waking up from sleep. ? Short-term weight loss. ? Unconsciousness. Follow these instructions at home:   If told by your doctor, drink an ORS: ? Make an ORS by using instructions on the package. ? Start by drinking small amounts, about  cup (120 mL) every 5-10 minutes. ? Slowly drink more until you have had the amount that your doctor said to have.  Drink enough clear fluid to keep your pee clear or pale yellow. If you were told to drink an ORS, finish the  ORS first, then start slowly drinking clear fluids. Drink fluids such as: ? Water. Do not drink only water by itself. Doing that can make the salt (sodium) level in your body get too low (hyponatremia). ? Ice chips. ? Fruit juice that you have added water to (diluted). ? Low-calorie sports drinks.  Avoid: ? Alcohol. ? Drinks that have a lot of sugar. These include high-calorie sports drinks, fruit juice that does not have water added, and soda. ? Caffeine. ? Foods that are greasy or have a lot of fat or sugar.  Take over-the-counter and prescription medicines only as told by your doctor.  Do not take salt tablets. Doing that can make the salt level in your body get too high (hypernatremia).  Eat foods that have minerals (electrolytes). Examples include bananas, oranges, potatoes, tomatoes, and spinach.  Keep all follow-up visits as told by your doctor. This is important. Contact a doctor if:  You have belly (abdominal) pain that: ? Gets worse. ? Stays in one area (localizes).  You have a rash.  You have a stiff neck.  You get angry or annoyed more easily than normal (irritability).  You are more sleepy than normal.  You have a harder time waking up than normal.  You feel: ? Weak. ? Dizzy. ? Very thirsty.  You have peed (urinated) only a small amount of very dark pee during 6-8 hours. Get help right away if:  You have   symptoms of very bad dehydration.  You cannot drink fluids without throwing up (vomiting).  Your symptoms get worse with treatment.  You have a fever.  You have a very bad headache.  You are throwing up or having watery poop (diarrhea) and it: ? Gets worse. ? Does not go away.  You have blood or something green (bile) in your throw-up.  You have blood in your poop (stool). This may cause poop to look black and tarry.  You have not peed in 6-8 hours.  You pass out (faint).  Your heart rate when you are sitting still is more than 100 beats a  minute.  You have trouble breathing. This information is not intended to replace advice given to you by your health care provider. Make sure you discuss any questions you have with your health care provider. Document Released: 02/09/2009 Document Revised: 03/28/2017 Document Reviewed: 06/09/2015 Elsevier Patient Education  2020 Elsevier Inc.  

## 2019-02-08 ENCOUNTER — Telehealth: Payer: Self-pay | Admitting: *Deleted

## 2019-02-08 NOTE — Telephone Encounter (Signed)
This RN spoke with pt today per her call stating " I am not feeling well and need the nurse to call me "  Alice [name] states she has lost her taste and not eating well.  She is having some constipation and is wondering if miralax is best to use.  She had an IV placed for IVF which extravasated " and now I have a brown area on my arm "  She states she has " aching in my legs sometimes "  This RN discussed above being secondary to current treatment regimen. Reviewed goals of interventions that are beneficial including use of small intake of high fat and protein food through out the day.  Discussed need for hydration with pt stating she is keeping track of her fluids and feels she is overall doing well.  This RN stated Miralax is very effective especially if she is drinking well for assistance with constipation with Alice [name] stating " what if I mix it in hot water "  This RN validated that warm or hotter liquids help the bowels move better.  Leg discomfort discussed with pt verifying discomfort is bilateral and without any swelling " just feel kind of like weak "  This RN discussed above likely relating to fatigue.  Per phone discussion pt stated appreciation of call and " you even made me laugh and I needed that " ( in response to this RN stating overall goal with treatment management is to eat, drink pee and poop- with rest in between ).  Alice [name] understands to call if needed and this RN did ask her to call tomorrow with update on how she is doing.  No other needs at this time.

## 2019-02-09 ENCOUNTER — Telehealth: Payer: Self-pay | Admitting: *Deleted

## 2019-02-09 NOTE — Telephone Encounter (Signed)
This RN spoke with pt late this afternoon with pt stating overall she is " about the same ", with main issue of severe fatigue " just no energy ".  She states she is feeling overall " just not good "  She is not nauseated but has no appetite or " can't really taste food ".  She states last BM was on Sunday and she is using miralax for aid in movements.  She is drinking approximately 24 oz of fluid daily " just trying to drink but can't make myself ".  " can't ya'll give me a pill to make me have some energy and feel better "  This RN informed pt issue may be more relating to fluid intake- and per discussion pt is willing to come in tomorrow for IVF for benefit.  This RN sent an urgent scheduling request per above.

## 2019-02-10 ENCOUNTER — Other Ambulatory Visit: Payer: Self-pay | Admitting: Emergency Medicine

## 2019-02-10 ENCOUNTER — Inpatient Hospital Stay: Payer: BC Managed Care – PPO

## 2019-02-10 ENCOUNTER — Other Ambulatory Visit: Payer: Self-pay

## 2019-02-10 ENCOUNTER — Telehealth: Payer: Self-pay | Admitting: *Deleted

## 2019-02-10 ENCOUNTER — Inpatient Hospital Stay (HOSPITAL_BASED_OUTPATIENT_CLINIC_OR_DEPARTMENT_OTHER): Payer: BC Managed Care – PPO | Admitting: Medical

## 2019-02-10 ENCOUNTER — Ambulatory Visit: Payer: BC Managed Care – PPO

## 2019-02-10 VITALS — BP 113/76 | HR 86 | Temp 98.5°F | Resp 18 | Ht 64.0 in | Wt 171.4 lb

## 2019-02-10 DIAGNOSIS — E860 Dehydration: Secondary | ICD-10-CM

## 2019-02-10 DIAGNOSIS — R21 Rash and other nonspecific skin eruption: Secondary | ICD-10-CM

## 2019-02-10 DIAGNOSIS — Z5189 Encounter for other specified aftercare: Secondary | ICD-10-CM | POA: Diagnosis not present

## 2019-02-10 DIAGNOSIS — K1379 Other lesions of oral mucosa: Secondary | ICD-10-CM | POA: Diagnosis not present

## 2019-02-10 DIAGNOSIS — Z170 Estrogen receptor positive status [ER+]: Secondary | ICD-10-CM

## 2019-02-10 DIAGNOSIS — C50411 Malignant neoplasm of upper-outer quadrant of right female breast: Secondary | ICD-10-CM

## 2019-02-10 DIAGNOSIS — Z5111 Encounter for antineoplastic chemotherapy: Secondary | ICD-10-CM | POA: Diagnosis not present

## 2019-02-10 LAB — CBC WITH DIFFERENTIAL (CANCER CENTER ONLY)
Abs Immature Granulocytes: 0.02 10*3/uL (ref 0.00–0.07)
Basophils Absolute: 0 10*3/uL (ref 0.0–0.1)
Basophils Relative: 2 %
Eosinophils Absolute: 0 10*3/uL (ref 0.0–0.5)
Eosinophils Relative: 0 %
HCT: 31.2 % — ABNORMAL LOW (ref 36.0–46.0)
Hemoglobin: 10.6 g/dL — ABNORMAL LOW (ref 12.0–15.0)
Immature Granulocytes: 1 %
Lymphocytes Relative: 34 %
Lymphs Abs: 0.6 10*3/uL — ABNORMAL LOW (ref 0.7–4.0)
MCH: 32.4 pg (ref 26.0–34.0)
MCHC: 34 g/dL (ref 30.0–36.0)
MCV: 95.4 fL (ref 80.0–100.0)
Monocytes Absolute: 0.1 10*3/uL (ref 0.1–1.0)
Monocytes Relative: 6 %
Neutro Abs: 1 10*3/uL — ABNORMAL LOW (ref 1.7–7.7)
Neutrophils Relative %: 57 %
Platelet Count: 218 10*3/uL (ref 150–400)
RBC: 3.27 MIL/uL — ABNORMAL LOW (ref 3.87–5.11)
RDW: 15.5 % (ref 11.5–15.5)
WBC Count: 1.7 10*3/uL — ABNORMAL LOW (ref 4.0–10.5)
nRBC: 0 % (ref 0.0–0.2)

## 2019-02-10 LAB — CMP (CANCER CENTER ONLY)
ALT: 23 U/L (ref 0–44)
AST: 49 U/L — ABNORMAL HIGH (ref 15–41)
Albumin: 3 g/dL — ABNORMAL LOW (ref 3.5–5.0)
Alkaline Phosphatase: 94 U/L (ref 38–126)
Anion gap: 10 (ref 5–15)
BUN: <4 mg/dL — ABNORMAL LOW (ref 6–20)
CO2: 24 mmol/L (ref 22–32)
Calcium: 9 mg/dL (ref 8.9–10.3)
Chloride: 107 mmol/L (ref 98–111)
Creatinine: 0.62 mg/dL (ref 0.44–1.00)
GFR, Est AFR Am: >60 mL/min (ref >60)
GFR, Estimated: >60 mL/min (ref >60)
Glucose, Bld: 106 mg/dL — ABNORMAL HIGH (ref 70–99)
Potassium: 3.9 mmol/L (ref 3.5–5.1)
Sodium: 141 mmol/L (ref 135–145)
Total Bilirubin: 0.4 mg/dL (ref 0.3–1.2)
Total Protein: 6.5 g/dL (ref 6.5–8.1)

## 2019-02-10 MED ORDER — MAGIC MOUTHWASH: ORAL | 1 refills | Status: DC

## 2019-02-10 MED ORDER — CLOTRIMAZOLE-BETAMETHASONE 1-0.05 % EX CREA: 1.0000 "application " | TOPICAL_CREAM | Freq: Two times a day (BID) | CUTANEOUS | 1 refills | Status: DC

## 2019-02-10 MED ORDER — OXYCODONE-ACETAMINOPHEN 5-325 MG PO TABS
1.0000 | ORAL_TABLET | Freq: Once | ORAL | Status: AC
  Administered 2019-02-10: 1 via ORAL

## 2019-02-10 MED ORDER — SODIUM CHLORIDE 0.9 % IV SOLN
INTRAVENOUS | Status: DC
  Administered 2019-02-10: 14:00:00 via INTRAVENOUS
  Filled 2019-02-10 (×2): qty 250

## 2019-02-10 MED ORDER — OXYCODONE-ACETAMINOPHEN 5-325 MG PO TABS
ORAL_TABLET | ORAL | Status: AC
  Filled 2019-02-10: qty 1

## 2019-02-10 NOTE — Progress Notes (Signed)
Pt received 1L IVF NS today, tolerated well.  Drank fluids also during infusion.  Reports feeling better afterwards.  Denies any questions or concerns at time of d/c.  Aware of prescription sent to pharmacy for rash on hand/thighs.

## 2019-02-10 NOTE — Patient Instructions (Signed)

## 2019-02-10 NOTE — Telephone Encounter (Signed)
This RN spoke with pt - and arranged for pt to come in for [facility] at noon today.  Urgent LOS sent per above.

## 2019-02-11 ENCOUNTER — Inpatient Hospital Stay: Payer: BC Managed Care – PPO

## 2019-02-14 NOTE — Progress Notes (Signed)
[facility] Progress Note   Alice [name] [id number] 1961/03/09 58 y.o.  Alice [name] is managed by Dr. Lurline Del  Actively treated with chemotherapy/immunotherapy/hormonal therapy: yes  Current therapy: Cytoxan and Taxotere with pegfilgrastim support   Last treated: 02/03/2019 (cycle 3, day 1)  Next scheduled appointment with provider: 02/24/2019.   Assessment: Plan:    Dehydration - Plan: 0.9 %  sodium chloride infusion, oxyCODONE-acetaminophen (PERCOCET/ROXICET) 5-325 MG per tablet 1 tablet  Malignant neoplasm of upper-outer quadrant of right breast in female, estrogen receptor positive (HCC)  Rash - Plan: clotrimazole-betamethasone (LOTRISONE) cream  Oral tenderness - Plan: magic mouthwash SOLN   Dehydration: The patient received 1 L normal saline IV x1.  Additionally she was having some back pain and was given 1 Percocet.  ER positive malignant neoplasm of the right breast: The patient is status post cycle 3, day 1 of Cytoxan and Taxotere with pegfilgrastim support.  This was dosed on 02/03/2019.  She will follow-up with Dr. Jana Hakim on 02/24/2019.  Diffuse rash of the hands and inner thigh: The patient had been given a prescription for Lotrisone cream.  Oral tenderness: The patient was given a prescription for Magic mouthwash.  Please see After Visit Summary for patient specific instructions.  Future Appointments  Date Time Provider [place]  02/24/2019  8:00 AM [facility] LAB 4 [facility] None  02/24/2019  8:30 AM Magrinat, Virgie Dad, MD [facility] None  02/24/2019  9:30 AM [facility] INFUSION [facility] None  02/24/2019 10:30 AM Karie Mainland, RD [facility] None  02/26/2019 11:30 AM [facility] INFUSION [facility] None    No orders of the defined types were placed in this encounter.      Subjective:   Patient ID:  Alice [name] is a 58 y.o. (DOB 12/19/1960) female.  Chief Complaint:   Chief Complaint  Patient presents with  . Fatigue    HPI Alice [name]  Is a 58 y.o. female with a diagnosis of an ER positive malignant neoplasm of the right breast.  She is is status post cycle 3, day 1 of Cytoxan and Taxotere with pegfilgrastim support.  This was dosed on 02/03/2019.  She continues to be followed by Dr. Jana Hakim and will be seen in follow-up on 02/24/2019.  She presents to the clinic today with fatigue, decreased energy, mild constipation, mild dyspnea on exertion, mild anorexia with changes in the taste of foods.  She also reports having a rash over her hands and inner thighs.  She also reports mild oral tenderness.  She denies nausea, vomiting, diarrhea, fevers, chills, depression or mood changes.  Medications: I have reviewed the patient's current medications.  Allergies: No Known Allergies  Past Medical History:  Diagnosis Date  . Anemia   . Breast cancer ([place])    right in 1997  . Family history of lung cancer   . Family history of ovarian cancer   . GERD (gastroesophageal reflux disease) 06/30/2018  . Herpes simplex type 2 infection 02/2006  . History of breast cancer 1997  . History of esophagogastroduodenoscopy (EGD) 09/15/2015   small hiatal hernia was noted otherwise all was normal  . Personal history of breast cancer     Past Surgical History:  Procedure Laterality Date  . BREAST ENHANCEMENT SURGERY     s/p cancer and surgery, right  . BREAST LUMPECTOMY     right due to breast cancer  . MASTECTOMY W/ SENTINEL NODE BIOPSY Right 10/22/2018   Procedure: RIGHT MASTECTOMY WITH RIGHT AXILLARY SENTINEL LYMPH NODE  BIOPSY;  Surgeon: Rolm Bookbinder, MD;  Location: [facility];  Service: General;  Laterality: Right;    Family History  Problem Relation Age of Onset  . Ovarian cancer Sister 87  . Dementia Mother   . Aneurysm Father     Social History   Socioeconomic History  . Marital status: Married    Spouse name: Not on  file  . Number of children: Not on file  . Years of education: Not on file  . Highest education level: Not on file  Occupational History  . Not on file  Social Needs  . Financial resource strain: Not on file  . Food insecurity    Worry: Not on file    Inability: Not on file  . Transportation needs    Medical: Not on file    Non-medical: Not on file  Tobacco Use  . Smoking status: Never Smoker  . Smokeless tobacco: Never Used  Substance and Sexual Activity  . Alcohol use: No    Alcohol/week: 0.0 standard drinks  . Drug use: No  . Sexual activity: Not Currently  Lifestyle  . Physical activity    Days per week: Not on file    Minutes per session: Not on file  . Stress: Not on file  Relationships  . Social Herbalist on phone: Not on file    Gets together: Not on file    Attends religious service: Not on file    Active member of club or organization: Not on file    Attends meetings of clubs or organizations: Not on file    Relationship status: Not on file  . Intimate partner violence    Fear of current or ex partner: Not on file    Emotionally abused: Not on file    Physically abused: Not on file    Forced sexual activity: Not on file  Other Topics Concern  . Not on file  Social History Narrative  . Not on file    Past Medical History, Surgical history, Social history, and Family history were reviewed and updated as appropriate.   Please see review of systems for further details on the patient's review from today.   Review of Systems:  Review of Systems  Constitutional: Positive for appetite change and fatigue. Negative for chills, diaphoresis, fever and unexpected weight change.  HENT: Negative for trouble swallowing.   Respiratory: Positive for shortness of breath. Negative for cough and chest tightness.   Cardiovascular: Negative for chest pain and palpitations.  Gastrointestinal: Positive for constipation. Negative for abdominal pain, diarrhea, nausea  and vomiting.  Skin: Positive for rash.  Neurological: Negative for dizziness and headaches.    Objective:   Physical Exam:  BP 113/76 (BP Location: Left Arm, Patient Position: Sitting)   Pulse 86   Temp 98.5 F (36.9 C) (Oral)   Resp 18   Ht 5\' 4"  (1.626 m)   Wt 171 lb 6.4 oz (77.7 kg)   SpO2 100%   BMI 29.42 kg/m  ECOG: 1  Physical Exam Constitutional:      General: She is not in acute distress.    Appearance: She is not diaphoretic.  HENT:     Head: Normocephalic and atraumatic.  Eyes:     General: No scleral icterus.       Right eye: No discharge.        Left eye: No discharge.  Cardiovascular:     Rate and Rhythm: Normal rate and regular  rhythm.     Heart sounds: Normal heart sounds. No murmur. No friction rub. No gallop.   Pulmonary:     Effort: Pulmonary effort is normal. No respiratory distress.     Breath sounds: Normal breath sounds. No wheezing or rales.  Abdominal:     General: Abdomen is flat. Bowel sounds are normal. There is no distension.     Palpations: Abdomen is soft.     Tenderness: There is no abdominal tenderness. There is no guarding.  Skin:    General: Skin is warm and dry.     Findings: Rash (There is a widely diffuse slightly raised rash over the hands and upper extremities bilaterally.) present. No erythema.  Neurological:     Mental Status: She is alert.     Coordination: Coordination normal.     Gait: Gait normal.  Psychiatric:        Mood and Affect: Mood normal.        Behavior: Behavior normal.        Thought Content: Thought content normal.        Judgment: Judgment normal.     Lab Review:     Component Value Date/Time   NA 141 02/10/2019 1229   K 3.9 02/10/2019 1229   CL 107 02/10/2019 1229   CO2 24 02/10/2019 1229   GLUCOSE 106 (H) 02/10/2019 1229   BUN <4 (L) 02/10/2019 1229   CREATININE 0.62 02/10/2019 1229   CALCIUM 9.0 02/10/2019 1229   PROT 6.5 02/10/2019 1229   ALBUMIN 3.0 (L) 02/10/2019 1229   AST 49 (H)  02/10/2019 1229   ALT 23 02/10/2019 1229   ALKPHOS 94 02/10/2019 1229   BILITOT 0.4 02/10/2019 1229   GFRNONAA >60 02/10/2019 1229   GFRAA >60 02/10/2019 1229       Component Value Date/Time   WBC 1.7 (L) 02/10/2019 1229   WBC 7.1 02/03/2019 0831   RBC 3.27 (L) 02/10/2019 1229   HGB 10.6 (L) 02/10/2019 1229   HCT 31.2 (L) 02/10/2019 1229   PLT 218 02/10/2019 1229   MCV 95.4 02/10/2019 1229   MCH 32.4 02/10/2019 1229   MCHC 34.0 02/10/2019 1229   RDW 15.5 02/10/2019 1229   LYMPHSABS 0.6 (L) 02/10/2019 1229   MONOABS 0.1 02/10/2019 1229   EOSABS 0.0 02/10/2019 1229   BASOSABS 0.0 02/10/2019 1229   -------------------------------  Imaging from last 24 hours (if applicable):  Radiology interpretation: No results found.

## 2019-02-16 ENCOUNTER — Telehealth: Payer: Self-pay

## 2019-02-16 MED ORDER — RESTASIS 0.05 % OP EMUL: 1.0000 [drp] | Freq: Two times a day (BID) | OPHTHALMIC | 1 refills | Status: DC

## 2019-02-16 NOTE — Telephone Encounter (Signed)
RN returned call.  Voicemail left for patient to call back, RE: watery eyes.

## 2019-02-16 NOTE — Telephone Encounter (Signed)
Pt reporting excessive watery eyes X 2 weeks, and left leg pain.  Patient is s/p D1C3 Taxotere.    Patient denies any redness, swelling, warmth to touch to left leg and no injury.  RN encouraged rest, elevation, and use OTC tylenol for pain.  Pt verbalized understanding.    Per Wilber Bihari, NP - Send RX for Restasis for excessive watery eyes.  If no improvement patient to notify us and consider ophthalmology referral.     Pt notified, verbalized understanding.

## 2019-02-22 ENCOUNTER — Telehealth: Payer: Self-pay | Admitting: *Deleted

## 2019-02-22 DIAGNOSIS — K219 Gastro-esophageal reflux disease without esophagitis: Secondary | ICD-10-CM | POA: Diagnosis not present

## 2019-02-22 NOTE — Telephone Encounter (Signed)
Pt left a VM requesting a return call regarding the eye drops and " feeling achy pains ".  Return call number given as [phone].  2nd VM left approximately 1.5 hours later stating " I really need a nurse to call me and the lady that does the FMLA - I have some new information to send to [employer]"  No return call number left with 2nd VM.  This RN returned call with no answer and automated message stating " VM is full and cannot leave a message ".

## 2019-02-23 DIAGNOSIS — R519 Headache, unspecified: Secondary | ICD-10-CM | POA: Diagnosis not present

## 2019-02-23 DIAGNOSIS — G935 Compression of brain: Secondary | ICD-10-CM | POA: Diagnosis not present

## 2019-02-23 NOTE — Progress Notes (Signed)
[place]  Telephone:([phone]) [phone] Fax:(336) [phone]    ID: Alice [name] DOB: [birth year]  MR#: [id number]  ZDG#:[id number]  Patient Care Team: [name], MD as PCP - General (Family Medicine) Mauro Kaufmann, RN as Oncology Nurse Navigator Rockwell Germany, RN as Oncology Nurse Navigator Tavyn Kurka, Virgie Dad, MD as Consulting Physician (Oncology) Kyung Rudd, MD as Consulting Physician (Radiation Oncology) Rolm Bookbinder, MD as Consulting Physician (General Surgery) Earnstine Regal, PA-C as Consulting Physician (Obstetrics and Gynecology) Juanita Craver, MD as Consulting Physician (Gastroenterology) [name], MD as Consulting Physician (Family Medicine) Cristine Polio, MD as Consulting Physician (Plastic Surgery) Chauncey Cruel, MD OTHER MD:  CHIEF COMPLAINT: Estrogen receptor positive breast cancer  CURRENT TREATMENT: Completing adjuvant chemotherapy   INTERVAL HISTORY: Alice [name] was seen today for follow up of her estrogen receptor positive breast cancer.  She continues on adjuvant chemotherapy with Docetaxel and Cyclophosphamide given every 21 days x 4 cycles.  This is with Neulasta support.  Today is day 1 of her final cycle.  Since her last visit, she has not undergone any additional studies. She will be due for repeat mammography in 08/2019.   REVIEW OF SYSTEMS: Alice [name] has done remarkably well with her treatment.  She does have epiphora.  She has not developed any neuropathy.  She told me this morning while taking a shower she had a little bit of tingling in her right hand but that was temporary and has resolved.  She gets very achy after the Neulasta shot.  She has had no intercurrent fever, no significant problems with nausea or vomiting, and overall has tolerated chemotherapy better than she expected.  Detailed review of systems was otherwise noncontributory.  HISTORY OF CURRENT ILLNESS: From the original intake note:  Alice [name] has a history of remote right breast cancer in 1997, treated with right lumpectomy and radiation in [place].  The patient did not receive chemotherapy or antiestrogen treatments.  More recently she presented for her annual mammogram with a non-tender area of firmness in the right breast at 9 o'clock for 2 months. She underwent bilateral diagnostic mammography with tomography and right breast ultrasonography at [facility] on 09/08/2018 showing: breast density category A; scattered-appearing fibroglandular tissue at the lumpectomy site in the upper outer right breast middle depth spanning 3.4 cm, with indistinct margins, was not seen on prior mammogram from 2018.  There was also a keloid scar in the inferior medial left breast.  On physical exam there was a rectangular area of hardness measuring up to 7 cm in the right breast at 9:00, corresponding with the prior lumpectomy and consistent with radiation change.  Ultrasound of this area showed only postlumpectomy change, with no suspicious or discrete mass.  Ultrasound of the axilla was benign  Accordingly on 09/23/2018 she proceeded to biopsy of the right breast area in question. The pathology from this procedure SAA 20-07/02/2000 showed: invasive ductal carcinoma, E-cadherin positive,  grade 3,  Prognostic indicators significant for: estrogen receptor at 100% positive and progesterone receptor, 30% positive, both with strong staining intensity proliferation marker Ki67 at 70%. HER2 negative by immunohistochemistry (0).  The patient's subsequent history is as detailed below.   PAST MEDICAL HISTORY: Past Medical History:  Diagnosis Date   Anemia    Breast cancer ([place])    right in 1997   Family history of lung cancer    Family history of ovarian cancer    GERD (gastroesophageal reflux disease) 06/30/2018   Herpes simplex type 2  infection 02/2006   History of breast cancer 1997   History of esophagogastroduodenoscopy (EGD)  09/15/2015   small hiatal hernia was noted otherwise all was normal   Personal history of breast cancer     PAST SURGICAL HISTORY: Past Surgical History:  Procedure Laterality Date   BREAST ENHANCEMENT SURGERY     s/p cancer and surgery, right   BREAST LUMPECTOMY     right due to breast cancer   MASTECTOMY W/ SENTINEL NODE BIOPSY Right 10/22/2018   Procedure: RIGHT MASTECTOMY WITH RIGHT AXILLARY SENTINEL LYMPH NODE BIOPSY;  Surgeon: Rolm Bookbinder, MD;  Location: [place];  Service: General;  Laterality: Right;  wisdom teeth removal   FAMILY HISTORY: Family History  Problem Relation Age of Onset   Ovarian cancer Sister 23   Dementia Mother    Aneurysm Father    Patient's father was 77 years old when he died from aneurysm. Patient's mother died from dementia at age 39. Her sister was diagnosed with ovarian cancer at age 54, and this subsequently took her life.  In total of the patient has 5 brothers and 2 sisters.  She is not aware of other cancers in the family  GYNECOLOGIC HISTORY:  No LMP recorded. Patient is postmenopausal. Menarche: 58 years old [place] P 0 LMP age 58 Contraceptive none HRT none  Hysterectomy? no BSO? 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 She is married. Husband Edmonia Caprio is from [place], so his native language is [place]. She lives at home with her husband. They have no children. She attends a [employer] in [place].    ADVANCED DIRECTIVES: In the absence of documents to the contrary her husband is automatically her HCPOA.   HEALTH MAINTENANCE: Social History   Tobacco Use   Smoking status: Never Smoker   Smokeless tobacco: Never Used  Substance Use Topics   Alcohol use: No    Alcohol/week: 0.0 standard drinks   Drug use: No     Colonoscopy: endoscopy in 2017, colonoscopy due 2020 but delayed due to virus  PAP: 12/03/2017, normal  Bone  density: never done   No Known Allergies  Current Outpatient Medications  Medication Sig Dispense Refill   anastrozole (ARIMIDEX) 1 MG tablet Take 1 tablet (1 mg total) by mouth daily. 90 tablet 4   ciprofloxacin (CIPRO) 500 MG tablet Take 1 tablet (500 mg total) by mouth 2 (two) times daily. 10 tablet 0   clotrimazole-betamethasone (LOTRISONE) cream Apply 1 application topically 2 (two) times daily. 45 g 1   cycloSPORINE (RESTASIS) 0.05 % ophthalmic emulsion Place 1 drop into both eyes 2 (two) times daily. 0.4 mL 1   dexamethasone (DECADRON) 4 MG tablet Take 2 tablets (8 mg total) by mouth 2 (two) times daily. Start the day before Taxotere. Then again the day after chemo for 3 days. 30 tablet 1   gabapentin (NEURONTIN) 300 MG capsule Take 1 capsule (300 mg total) by mouth at bedtime. 90 capsule 4   lidocaine-prilocaine (EMLA) cream Apply to affected area once 30 g 3   LORazepam (ATIVAN) 0.5 MG tablet Take 1 tablet (0.5 mg total) by mouth at bedtime as needed (Nausea or vomiting). 30 tablet 0   magic mouthwash SOLN 5 to 10 ml swish, gargle and spit QID prn mouth soreness. 240 mL 1   omeprazole (PRILOSEC) 40 MG capsule Take 1 capsule (40 mg total) by mouth daily. 30 capsule 5  oxyCODONE-acetaminophen (PERCOCET/ROXICET) 5-325 MG tablet Take 1 tablet by mouth every 6 (six) hours as needed for severe pain. 15 tablet 0   prochlorperazine (COMPAZINE) 10 MG tablet Take 1 tablet (10 mg total) by mouth every 6 (six) hours as needed (Nausea or vomiting). 30 tablet 1   No current facility-administered medications for this visit.    Facility-Administered Medications Ordered in Other Visits  Medication Dose Route Frequency Provider Last Rate Last Dose   0.9 %  sodium chloride infusion   Intravenous Continuous Budd Freiermuth, Virgie Dad, MD   Stopped at 02/24/19 1401    OBJECTIVE: Middle-aged African-American woman in no acute distress  Vitals:   02/24/19 0858  BP: 117/76  Pulse: (!)  108 Comment: nurse notified  Temp: 98.5 F (36.9 C)  Resp: 17  Height: 5' 4"  (1.626 m)  Weight: 174 lb 6.4 oz (79.1 kg)  SpO2: 100%  TempSrc: Temporal  BMI (Calculated): 29.92    Filed Weights   02/24/19 0858  Weight: 174 lb 6.4 oz (79.1 kg)   ECOG: 1  Sclerae unicteric, EOMs intact Wearing a mask No cervical or supraclavicular adenopathy Lungs no rales or rhonchi Heart regular rate and rhythm Abd soft, nontender, positive bowel sounds MSK no focal spinal tenderness, no upper extremity lymphedema Neuro: nonfocal, well oriented, appropriate affect Breasts: Status post right mastectomy with no evidence of disease recurrence.  Left breast is benign.  Both axillae are benign.   LAB RESULTS:  CMP     Component Value Date/Time   NA 142 02/24/2019 0839   K 4.6 02/24/2019 0839   CL 108 02/24/2019 0839   CO2 24 02/24/2019 0839   GLUCOSE 91 02/24/2019 0839   BUN <4 (L) 02/24/2019 0839   CREATININE 0.60 02/24/2019 0839   CREATININE 0.62 02/10/2019 1229   CALCIUM 8.9 02/24/2019 0839   PROT 6.1 (L) 02/24/2019 0839   ALBUMIN 3.1 (L) 02/24/2019 0839   AST 23 02/24/2019 0839   AST 49 (H) 02/10/2019 1229   ALT 9 02/24/2019 0839   ALT 23 02/10/2019 1229   ALKPHOS 69 02/24/2019 0839   BILITOT 0.5 02/24/2019 0839   BILITOT 0.4 02/10/2019 1229   GFRNONAA >60 02/24/2019 0839   GFRNONAA >60 02/10/2019 1229   GFRAA >60 02/24/2019 0839   GFRAA >60 02/10/2019 1229    No results found for: TOTALPROTELP, ALBUMINELP, A1GS, A2GS, BETS, BETA2SER, GAMS, MSPIKE, SPEI  No results found for: KPAFRELGTCHN, LAMBDASER, KAPLAMBRATIO  Lab Results  Component Value Date   WBC 6.8 02/24/2019   NEUTROABS 5.3 02/24/2019   HGB 11.4 (L) 02/24/2019   HCT 35.5 (L) 02/24/2019   MCV 99.7 02/24/2019   PLT 249 02/24/2019    @LASTCHEMISTRY @  No results found for: LABCA2  No components found for: [id number]  No results for input(s): INR in the last 168 hours.  No results found for:  LABCA2  No results found for: GDJ242  No results found for: AST419  No results found for: QQI297  No results found for: CA2729  No components found for: HGQUANT  No results found for: CEA1 / No results found for: CEA1   No results found for: AFPTUMOR  No results found for: CHROMOGRNA  No results found for: PSA1  Appointment on 02/24/2019  Component Date Value Ref Range Status   Sodium 02/24/2019 142  135 - 145 mmol/L Final   Potassium 02/24/2019 4.6  3.5 - 5.1 mmol/L Final   Chloride 02/24/2019 108  98 - 111 mmol/L Final   CO2  02/24/2019 24  22 - 32 mmol/L Final   Glucose, Bld 02/24/2019 91  70 - 99 mg/dL Final   BUN 02/24/2019 <4* 6 - 20 mg/dL Final   Creatinine, Ser 02/24/2019 0.60  0.44 - 1.00 mg/dL Final   Calcium 02/24/2019 8.9  8.9 - 10.3 mg/dL Final   Total Protein 02/24/2019 6.1* 6.5 - 8.1 g/dL Final   Albumin 02/24/2019 3.1* 3.5 - 5.0 g/dL Final   AST 02/24/2019 23  15 - 41 U/L Final   ALT 02/24/2019 9  0 - 44 U/L Final   Alkaline Phosphatase 02/24/2019 69  38 - 126 U/L Final   Total Bilirubin 02/24/2019 0.5  0.3 - 1.2 mg/dL Final   GFR calc non Af Amer 02/24/2019 >60  >60 mL/min Final   GFR calc Af Amer 02/24/2019 >60  >60 mL/min Final   Anion gap 02/24/2019 10  5 - 15 Final   Performed at [facility], [place] [address] [place], [place] [address]   WBC 02/24/2019 6.8  4.0 - 10.5 K/uL Final   RBC 02/24/2019 3.56* 3.87 - 5.11 MIL/uL Final   Hemoglobin 02/24/2019 11.4* 12.0 - 15.0 g/dL Final   HCT 02/24/2019 35.5* 36.0 - 46.0 % Final   MCV 02/24/2019 99.7  80.0 - 100.0 fL Final   MCH 02/24/2019 32.0  26.0 - 34.0 pg Final   MCHC 02/24/2019 32.1  30.0 - 36.0 g/dL Final   RDW 02/24/2019 17.3* 11.5 - 15.5 % Final   Platelets 02/24/2019 249  150 - 400 K/uL Final   nRBC 02/24/2019 0.0  0.0 - 0.2 % Final   Neutrophils Relative % 02/24/2019 79  % Final   Neutro Abs 02/24/2019 5.3  1.7 - 7.7 K/uL Final    Lymphocytes Relative 02/24/2019 12  % Final   Lymphs Abs 02/24/2019 0.8  0.7 - 4.0 K/uL Final   Monocytes Relative 02/24/2019 8  % Final   Monocytes Absolute 02/24/2019 0.6  0.1 - 1.0 K/uL Final   Eosinophils Relative 02/24/2019 0  % Final   Eosinophils Absolute 02/24/2019 0.0  0.0 - 0.5 K/uL Final   Basophils Relative 02/24/2019 1  % Final   Basophils Absolute 02/24/2019 0.0  0.0 - 0.1 K/uL Final   Immature Granulocytes 02/24/2019 0  % Final   Abs Immature Granulocytes 02/24/2019 0.02  0.00 - 0.07 K/uL Final   Performed at [facility], [place] [place], [place] [address]    (this displays the last labs from the last 3 days)  No results found for: TOTALPROTELP, ALBUMINELP, A1GS, A2GS, BETS, BETA2SER, GAMS, MSPIKE, SPEI (this displays SPEP labs)  No results found for: KPAFRELGTCHN, LAMBDASER, KAPLAMBRATIO (kappa/lambda light chains)  No results found for: HGBA, HGBA2QUANT, HGBFQUANT, HGBSQUAN (Hemoglobinopathy evaluation)   No results found for: LDH  No results found for: IRON, TIBC, IRONPCTSAT (Iron and TIBC)  No results found for: FERRITIN  Urinalysis    Component Value Date/Time   COLORURINE YELLOW 12/30/2018 0948   APPEARANCEUR CLEAR 12/30/2018 0948   LABSPEC 1.004 (L) 12/30/2018 0948   PHURINE 8.0 12/30/2018 [place] 12/30/2018 0948   HGBUR NEGATIVE 12/30/2018 [place] 12/30/2018 0948   KETONESUR NEGATIVE 12/30/2018 0948   PROTEINUR NEGATIVE 12/30/2018 0948   NITRITE NEGATIVE 12/30/2018 0948   LEUKOCYTESUR NEGATIVE 12/30/2018 0948    STUDIES: No results found.    ELIGIBLE FOR AVAILABLE RESEARCH PROTOCOL: no  ASSESSMENT: 58 y.o. [place] woman   (1)  status post right lumpectomy in 1997,  (a) status post adjuvant radiation  (b) did not receive antiestrogens or chemotherapy  (2) status post right breast upper outer quadrant biopsy 09/23/2018 for a clinical T2 N0, stage IIA  invasive ductal carcinoma, grade 3, estrogen and progesterone receptor positive, HER-2 not amplified, with an MIB-1 of 70%  (3) Oncotype obtained from the initial biopsy showed a score of 26 predicting a risk of recurrence outside the breast of 16/21% (depending on nodal status), if the patient's only systemic therapy is an antiestrogen for 5 years.  It also predicts a significant benefit from chemotherapy  (4) status post right mastectomy and sentinel lymph node sampling 10/22/2018 for a pT2 pN0, stage IB invasive ductal carcinoma, grade 2, with negative margins  (a) not planning reconstruction at this point  (5) adjuvant chemotherapy recommended on 11/05/2018 by Dr. Jana Hakim  (a) second opinion with Dr. Annabell Sabal at [facility] on 11/18/2018 recommended 4 cycles of adjuvant docetaxel and cyclophosphamide   (b) Third opinion with Dr. Adan Sis on 11/24/2018 recommended 4 cycles of adjuvant Docetaxel and Cyclophosphamide  (c) started adjuvant chemotherapy with four cycles of docetaxel and cyclophosphamide on 12/23/2018, completed 02/24/2019  (6) anastrozole started 09/30/2018 (patient stopped due to concerns over bone loss--to restart once adjuvant therapy has completed)  (a) Bone density on 12/10/2018 shows a T score of -3.8 in the AP spine: Osteoporosis  (7) genetics testing 10/06/2018 through the [facility] Common Hereditary Cancers Panel found no deleterious mutations in APC, ATM, AXIN2, BARD1, BMPR1A, BRCA1, BRCA2, BRIP1, CDH1, CDKN2A (p14ARF), CDKN2A (p16INK4a), CKD4, CHEK2, CTNNA1, DICER1, EPCAM (Deletion/duplication testing only), GREM1 (promoter region deletion/duplication testing only), KIT, MEN1, MLH1, MSH2, MSH3, MSH6, MUTYH, NBN, NF1, NHTL1, PALB2, PDGFRA, PMS2, POLD1, POLE, PTEN, RAD50, RAD51C, RAD51D, SDHB, SDHC, SDHD, SMAD4, SMARCA4. STK11, TP53, TSC1, TSC2, and VHL. The following genes were evaluated for sequence changes only: SDHA and HOXB13 c.251G>A variant only.   (a) 2 VUS's identified: VUS in  APC c.-34014C>T and VHL c.340+272T>G identified   PLAN: Nita completes chemotherapy today.  This is a real achievement for her.  She has done terrific, with no dose reductions or delays.  She does not appear to have had any major endorgan damage.  I am hopeful she will recover fully over the next 3 to 6 months.  She knows she can expect some nail changes and that it takes 3 to 4 months for the fingernails and 4 to 8 months for the toenails to completely heal.  She has not been able to obtain the eyedrops we prescribed, apparently for insurance reasons.  She was told that the prescription was in there but I have put the prescription in directly myself.  She will let us know if she still has problems obtaining it.  I am prescribing Neurontin for her to take at bedtime to see if that helps with the burning sensation she has in the right chest wall as well as her problems with sleep.  Otherwise she will return to see me in about a month.  We should be able to start anastrozole around that time.  She knows to call for any other issue that may develop before that visit. Virgie Dad. Alice Lutz, MD  02/24/2019 2:46 PM Medical Oncology and Hematology [facility] [place], [place] [address] Tel. [phone]    Fax. [phone]   I, Wilburn Mylar, am acting as scribe for Dr. Virgie Dad. Alice [name].  Lindie Spruce MD, have reviewed the above  documentation for accuracy and completeness, and I agree with the above.

## 2019-02-24 ENCOUNTER — Telehealth: Payer: Self-pay | Admitting: *Deleted

## 2019-02-24 ENCOUNTER — Inpatient Hospital Stay: Payer: BC Managed Care – PPO

## 2019-02-24 ENCOUNTER — Other Ambulatory Visit: Payer: Self-pay

## 2019-02-24 ENCOUNTER — Inpatient Hospital Stay: Payer: BC Managed Care – PPO | Admitting: Nutrition

## 2019-02-24 ENCOUNTER — Inpatient Hospital Stay (HOSPITAL_BASED_OUTPATIENT_CLINIC_OR_DEPARTMENT_OTHER): Payer: BC Managed Care – PPO | Admitting: Oncology

## 2019-02-24 ENCOUNTER — Encounter: Payer: Self-pay | Admitting: *Deleted

## 2019-02-24 VITALS — HR 94

## 2019-02-24 VITALS — BP 117/76 | HR 108 | Temp 98.5°F | Resp 17 | Ht 64.0 in | Wt 174.4 lb

## 2019-02-24 DIAGNOSIS — Z170 Estrogen receptor positive status [ER+]: Secondary | ICD-10-CM

## 2019-02-24 DIAGNOSIS — C50411 Malignant neoplasm of upper-outer quadrant of right female breast: Secondary | ICD-10-CM

## 2019-02-24 DIAGNOSIS — Z5189 Encounter for other specified aftercare: Secondary | ICD-10-CM | POA: Diagnosis not present

## 2019-02-24 DIAGNOSIS — Z5111 Encounter for antineoplastic chemotherapy: Secondary | ICD-10-CM | POA: Diagnosis not present

## 2019-02-24 DIAGNOSIS — E860 Dehydration: Secondary | ICD-10-CM | POA: Diagnosis not present

## 2019-02-24 LAB — CBC WITH DIFFERENTIAL/PLATELET
Abs Immature Granulocytes: 0.02 10*3/uL (ref 0.00–0.07)
Basophils Absolute: 0 10*3/uL (ref 0.0–0.1)
Basophils Relative: 1 %
Eosinophils Absolute: 0 10*3/uL (ref 0.0–0.5)
Eosinophils Relative: 0 %
HCT: 35.5 % — ABNORMAL LOW (ref 36.0–46.0)
Hemoglobin: 11.4 g/dL — ABNORMAL LOW (ref 12.0–15.0)
Immature Granulocytes: 0 %
Lymphocytes Relative: 12 %
Lymphs Abs: 0.8 10*3/uL (ref 0.7–4.0)
MCH: 32 pg (ref 26.0–34.0)
MCHC: 32.1 g/dL (ref 30.0–36.0)
MCV: 99.7 fL (ref 80.0–100.0)
Monocytes Absolute: 0.6 10*3/uL (ref 0.1–1.0)
Monocytes Relative: 8 %
Neutro Abs: 5.3 10*3/uL (ref 1.7–7.7)
Neutrophils Relative %: 79 %
Platelets: 249 10*3/uL (ref 150–400)
RBC: 3.56 MIL/uL — ABNORMAL LOW (ref 3.87–5.11)
RDW: 17.3 % — ABNORMAL HIGH (ref 11.5–15.5)
WBC: 6.8 10*3/uL (ref 4.0–10.5)
nRBC: 0 % (ref 0.0–0.2)

## 2019-02-24 LAB — COMPREHENSIVE METABOLIC PANEL
ALT: 9 U/L (ref 0–44)
AST: 23 U/L (ref 15–41)
Albumin: 3.1 g/dL — ABNORMAL LOW (ref 3.5–5.0)
Alkaline Phosphatase: 69 U/L (ref 38–126)
Anion gap: 10 (ref 5–15)
BUN: <4 mg/dL — ABNORMAL LOW (ref 6–20)
CO2: 24 mmol/L (ref 22–32)
Calcium: 8.9 mg/dL (ref 8.9–10.3)
Chloride: 108 mmol/L (ref 98–111)
Creatinine, Ser: 0.6 mg/dL (ref 0.44–1.00)
GFR calc Af Amer: >60 mL/min (ref >60)
GFR calc non Af Amer: >60 mL/min (ref >60)
Glucose, Bld: 91 mg/dL (ref 70–99)
Potassium: 4.6 mmol/L (ref 3.5–5.1)
Sodium: 142 mmol/L (ref 135–145)
Total Bilirubin: 0.5 mg/dL (ref 0.3–1.2)
Total Protein: 6.1 g/dL — ABNORMAL LOW (ref 6.5–8.1)

## 2019-02-24 MED ORDER — PALONOSETRON HCL INJECTION 0.25 MG/5ML
INTRAVENOUS | Status: AC
  Filled 2019-02-24: qty 5

## 2019-02-24 MED ORDER — DEXAMETHASONE SODIUM PHOSPHATE 10 MG/ML IJ SOLN
INTRAMUSCULAR | Status: AC
  Filled 2019-02-24: qty 1

## 2019-02-24 MED ORDER — SODIUM CHLORIDE 0.9 % IV SOLN
600.0000 mg/m2 | Freq: Once | INTRAVENOUS | Status: AC
  Administered 2019-02-24: 1180 mg via INTRAVENOUS
  Filled 2019-02-24: qty 59

## 2019-02-24 MED ORDER — SODIUM CHLORIDE 0.9 % IV SOLN
75.0000 mg/m2 | Freq: Once | INTRAVENOUS | Status: AC
  Administered 2019-02-24: 12:00:00 150 mg via INTRAVENOUS
  Filled 2019-02-24: qty 15

## 2019-02-24 MED ORDER — SODIUM CHLORIDE 0.9 % IV SOLN
INTRAVENOUS | Status: DC
  Administered 2019-02-24: 11:00:00 via INTRAVENOUS
  Filled 2019-02-24 (×2): qty 250

## 2019-02-24 MED ORDER — PALONOSETRON HCL INJECTION 0.25 MG/5ML
0.2500 mg | Freq: Once | INTRAVENOUS | Status: AC
  Administered 2019-02-24: 11:00:00 0.25 mg via INTRAVENOUS

## 2019-02-24 MED ORDER — DEXAMETHASONE SODIUM PHOSPHATE 10 MG/ML IJ SOLN
10.0000 mg | Freq: Once | INTRAMUSCULAR | Status: AC
  Administered 2019-02-24: 11:00:00 10 mg via INTRAVENOUS

## 2019-02-24 MED ORDER — RESTASIS 0.05 % OP EMUL: 1.0000 [drp] | Freq: Two times a day (BID) | OPHTHALMIC | 1 refills | Status: DC

## 2019-02-24 MED ORDER — GABAPENTIN 300 MG PO CAPS: 300.0000 mg | ORAL_CAPSULE | Freq: Every day | ORAL | 4 refills | Status: DC

## 2019-02-24 MED ORDER — SODIUM CHLORIDE 0.9 % IV SOLN
Freq: Once | INTRAVENOUS | Status: AC
  Administered 2019-02-24: 11:00:00 via INTRAVENOUS
  Filled 2019-02-24: qty 250

## 2019-02-24 NOTE — Patient Instructions (Addendum)
[facility] Discharge Instructions for Patients Receiving Chemotherapy  Today you received the following chemotherapy agents Docetaxel (TAXOTERE) & Cyclophosphamide (CYTOXAN).  To help prevent nausea and vomiting after your treatment, we encourage you to take your nausea medication as prescribed.  If you develop nausea and vomiting that is not controlled by your nausea medication, call the clinic.   BELOW ARE SYMPTOMS THAT SHOULD BE REPORTED IMMEDIATELY:  *FEVER GREATER THAN 100.5 F  *CHILLS WITH OR WITHOUT FEVER  NAUSEA AND VOMITING THAT IS NOT CONTROLLED WITH YOUR NAUSEA MEDICATION  *UNUSUAL SHORTNESS OF BREATH  *UNUSUAL BRUISING OR BLEEDING  TENDERNESS IN MOUTH AND THROAT WITH OR WITHOUT PRESENCE OF ULCERS  *URINARY PROBLEMS  *BOWEL PROBLEMS  UNUSUAL RASH Items with * indicate a potential emergency and should be followed up as soon as possible.  Feel free to call the clinic should you have any questions or concerns. The clinic phone number is [phone].  Please show the CHEMO ALERT CARD at check-in to the Emergency Department and triage nurse.   

## 2019-02-24 NOTE — Telephone Encounter (Addendum)
This RN per communication from pt sent new information including letter and today's dictation to [employer] for extension of her leave from work.  Per Alice [name] she would like to extend her leave until 04/05/2019 with return to work date of 04/06/2019.  Alice [name] stated information needed to have her claim number of [id number].  Above faxed with office note and letter.

## 2019-02-24 NOTE — Progress Notes (Signed)
Nutrition follow up completed with patient during infusion for ER+ Breast Cancer. Patient is very happy she is having her last treatment. Weight documented as 174.4 pounds today. Reports she is trying to move towards a Vegan diet. Verbalizes concern over protein.  Nutrition diagnosis: Food and nutrition related knowledge deficit continues.  Intervention: Patient educated on excellent sources of vegan protein.  Fact sheet was provided. Educated patient on the importance of choosing all foods in moderation. Teach back method used.  Monitoring, evaluation, goals: Patient will choose healthy plant-based foods and less processed meats.  Nutrition diagnosis resolved.  No follow-up required.  **Disclaimer: This note was dictated with voice recognition software. Similar sounding words can inadvertently be transcribed and this note may contain transcription errors which may not have been corrected upon publication of note.**

## 2019-02-25 ENCOUNTER — Telehealth: Payer: Self-pay | Admitting: Oncology

## 2019-02-25 NOTE — Telephone Encounter (Signed)
I talk with patient regarding schedule  

## 2019-02-26 ENCOUNTER — Inpatient Hospital Stay: Payer: BC Managed Care – PPO

## 2019-02-26 ENCOUNTER — Other Ambulatory Visit: Payer: Self-pay

## 2019-02-26 ENCOUNTER — Ambulatory Visit: Payer: BC Managed Care – PPO

## 2019-02-26 VITALS — BP 101/64 | HR 82 | Temp 98.7°F | Resp 18

## 2019-02-26 DIAGNOSIS — C50411 Malignant neoplasm of upper-outer quadrant of right female breast: Secondary | ICD-10-CM | POA: Diagnosis not present

## 2019-02-26 DIAGNOSIS — Z170 Estrogen receptor positive status [ER+]: Secondary | ICD-10-CM | POA: Diagnosis not present

## 2019-02-26 DIAGNOSIS — Z5189 Encounter for other specified aftercare: Secondary | ICD-10-CM | POA: Diagnosis not present

## 2019-02-26 DIAGNOSIS — Z5111 Encounter for antineoplastic chemotherapy: Secondary | ICD-10-CM | POA: Diagnosis not present

## 2019-02-26 DIAGNOSIS — E860 Dehydration: Secondary | ICD-10-CM | POA: Diagnosis not present

## 2019-02-26 MED ORDER — SODIUM CHLORIDE 0.9 % IV SOLN
Freq: Once | INTRAVENOUS | Status: AC
  Administered 2019-02-26: 12:00:00 via INTRAVENOUS
  Filled 2019-02-26: qty 250

## 2019-02-26 MED ORDER — PEGFILGRASTIM-JMDB 6 MG/0.6ML ~~LOC~~ SOSY
PREFILLED_SYRINGE | SUBCUTANEOUS | Status: AC
  Filled 2019-02-26: qty 0.6

## 2019-02-26 MED ORDER — PEGFILGRASTIM-JMDB 6 MG/0.6ML ~~LOC~~ SOSY
6.0000 mg | PREFILLED_SYRINGE | Freq: Once | SUBCUTANEOUS | Status: AC
  Administered 2019-02-26: 6 mg via SUBCUTANEOUS

## 2019-02-26 MED ORDER — OXYCODONE-ACETAMINOPHEN 5-325 MG PO TABS
1.0000 | ORAL_TABLET | Freq: Once | ORAL | Status: AC
  Administered 2019-02-26: 1 via ORAL

## 2019-02-26 MED ORDER — OXYCODONE-ACETAMINOPHEN 5-325 MG PO TABS
ORAL_TABLET | ORAL | Status: AC
  Filled 2019-02-26: qty 1

## 2019-02-26 NOTE — Patient Instructions (Signed)
Dehydration, Adult  Dehydration is when there is not enough fluid or water in your body. This happens when you lose more fluids than you take in. Dehydration can range from mild to very bad. It should be treated right away to keep it from getting very bad. Symptoms of mild dehydration may include:  Thirst.  Dry lips.  Slightly dry mouth.  Dry, warm skin.  Dizziness. Symptoms of moderate dehydration may include:  Very dry mouth.  Muscle cramps.  Dark pee (urine). Pee may be the color of tea.  Your body making less pee.  Your eyes making fewer tears.  Heartbeat that is uneven or faster than normal (palpitations).  Headache.  Light-headedness, especially when you stand up from sitting.  Fainting (syncope). Symptoms of very bad dehydration may include:  Changes in skin, such as: ? Cold and clammy skin. ? Blotchy (mottled) or pale skin. ? Skin that does not quickly return to normal after being lightly pinched and let go (poor skin turgor).  Changes in body fluids, such as: ? Feeling very thirsty. ? Your eyes making fewer tears. ? Not sweating when body temperature is high, such as in hot weather. ? Your body making very little pee.  Changes in vital signs, such as: ? Weak pulse. ? Pulse that is more than 100 beats a minute when you are sitting still. ? Fast breathing. ? Low blood pressure.  Other changes, such as: ? Sunken eyes. ? Cold hands and feet. ? Confusion. ? Lack of energy (lethargy). ? Trouble waking up from sleep. ? Short-term weight loss. ? Unconsciousness. Follow these instructions at home:   If told by your doctor, drink an ORS: ? Make an ORS by using instructions on the package. ? Start by drinking small amounts, about  cup (120 mL) every 5-10 minutes. ? Slowly drink more until you have had the amount that your doctor said to have.  Drink enough clear fluid to keep your pee clear or pale yellow. If you were told to drink an ORS, finish the  ORS first, then start slowly drinking clear fluids. Drink fluids such as: ? Water. Do not drink only water by itself. Doing that can make the salt (sodium) level in your body get too low (hyponatremia). ? Ice chips. ? Fruit juice that you have added water to (diluted). ? Low-calorie sports drinks.  Avoid: ? Alcohol. ? Drinks that have a lot of sugar. These include high-calorie sports drinks, fruit juice that does not have water added, and soda. ? Caffeine. ? Foods that are greasy or have a lot of fat or sugar.  Take over-the-counter and prescription medicines only as told by your doctor.  Do not take salt tablets. Doing that can make the salt level in your body get too high (hypernatremia).  Eat foods that have minerals (electrolytes). Examples include bananas, oranges, potatoes, tomatoes, and spinach.  Keep all follow-up visits as told by your doctor. This is important. Contact a doctor if:  You have belly (abdominal) pain that: ? Gets worse. ? Stays in one area (localizes).  You have a rash.  You have a stiff neck.  You get angry or annoyed more easily than normal (irritability).  You are more sleepy than normal.  You have a harder time waking up than normal.  You feel: ? Weak. ? Dizzy. ? Very thirsty.  You have peed (urinated) only a small amount of very dark pee during 6-8 hours. Get help right away if:  You have   symptoms of very bad dehydration.  You cannot drink fluids without throwing up (vomiting).  Your symptoms get worse with treatment.  You have a fever.  You have a very bad headache.  You are throwing up or having watery poop (diarrhea) and it: ? Gets worse. ? Does not go away.  You have blood or something green (bile) in your throw-up.  You have blood in your poop (stool). This may cause poop to look black and tarry.  You have not peed in 6-8 hours.  You pass out (faint).  Your heart rate when you are sitting still is more than 100 beats a  minute.  You have trouble breathing. This information is not intended to replace advice given to you by your health care provider. Make sure you discuss any questions you have with your health care provider. Document Released: 02/09/2009 Document Revised: 03/28/2017 Document Reviewed: 06/09/2015 Elsevier Patient Education  2020 Elsevier Inc.  

## 2019-03-01 ENCOUNTER — Telehealth: Payer: Self-pay | Admitting: Oncology

## 2019-03-01 ENCOUNTER — Telehealth: Payer: Self-pay | Admitting: *Deleted

## 2019-03-01 NOTE — Telephone Encounter (Signed)
Scheduled appt per 1/12 sch message - unable to reach pt- left message with appt date and time   

## 2019-03-01 NOTE — Telephone Encounter (Signed)
This RN spoke with pt per her call stating " I am aching all over and just do not feel good "  Destyne denies any chills or known fevers ( but she is not checking her temp ) " but I do not feel like I have a fever "  She states she has no appetite and not eating well.  She is trying to maintain hydration.  She states aching is " all over " but noted increase in legs and " back of my neck that goes down my spine "  She is able to tolerate light.  She is able to do her ADL's but " just don't feel good "  This RN discussed above likely secondary to injection given for immune support.  She has taken tylenol with little benefit.  She states she used claritin with first cycle " and it didn't help"  She has neurontin on her med list and has not been using it.  Stephanee stated concern for aching and " staying this way ".  This RN discussed above is temporary and symptoms will improve- though she will have to be patient as her body overall recovers.  Per discussion- plan is for pt to try the neurontin for comfort.  She is agreeable to coming in for IVF- but not able to today or tomorrow and is requesting for them to be later this week.  This RN placed an appointment request for IVF W/TH/F of this week for symptom control.

## 2019-03-02 DIAGNOSIS — F329 Major depressive disorder, single episode, unspecified: Secondary | ICD-10-CM | POA: Diagnosis not present

## 2019-03-03 ENCOUNTER — Inpatient Hospital Stay: Payer: BC Managed Care – PPO | Admitting: Medical

## 2019-03-03 ENCOUNTER — Other Ambulatory Visit: Payer: Self-pay | Admitting: *Deleted

## 2019-03-03 ENCOUNTER — Inpatient Hospital Stay: Payer: BC Managed Care – PPO | Attending: Oncology

## 2019-03-03 ENCOUNTER — Other Ambulatory Visit: Payer: Self-pay

## 2019-03-03 VITALS — BP 117/78 | HR 99 | Temp 98.9°F | Resp 18

## 2019-03-03 DIAGNOSIS — Z170 Estrogen receptor positive status [ER+]: Secondary | ICD-10-CM

## 2019-03-03 DIAGNOSIS — C50811 Malignant neoplasm of overlapping sites of right female breast: Secondary | ICD-10-CM | POA: Insufficient documentation

## 2019-03-03 DIAGNOSIS — Z79899 Other long term (current) drug therapy: Secondary | ICD-10-CM | POA: Diagnosis not present

## 2019-03-03 DIAGNOSIS — Z79811 Long term (current) use of aromatase inhibitors: Secondary | ICD-10-CM | POA: Diagnosis not present

## 2019-03-03 DIAGNOSIS — Z5189 Encounter for other specified aftercare: Secondary | ICD-10-CM | POA: Insufficient documentation

## 2019-03-03 DIAGNOSIS — C50411 Malignant neoplasm of upper-outer quadrant of right female breast: Secondary | ICD-10-CM

## 2019-03-03 MED ORDER — SODIUM CHLORIDE 0.9 % IV SOLN
INTRAVENOUS | Status: AC
  Administered 2019-03-03: 16:00:00 via INTRAVENOUS
  Filled 2019-03-03: qty 250

## 2019-03-03 NOTE — Patient Instructions (Addendum)
Rehydration, Adult Rehydration is the replacement of body fluids and salts and minerals (electrolytes) that are lost during dehydration. Dehydration is when there is not enough fluid or water in the body. This happens when you lose more fluids than you take in. Common causes of dehydration include:  Vomiting.  Diarrhea.  Excessive sweating, such as from heat exposure or exercise.  Taking medicines that cause the body to lose excess fluid (diuretics).  Impaired kidney function.  Not drinking enough fluid.  Certain illnesses or infections.  Certain poorly controlled long-term (chronic) illnesses, such as diabetes, heart disease, and kidney disease.  Symptoms of mild dehydration may include thirst, dry lips and mouth, dry skin, and dizziness. Symptoms of severe dehydration may include increased heart rate, confusion, fainting, and not urinating. You can rehydrate by drinking certain fluids or getting fluids through an IV tube, as told by your health care provider. What are the risks? Generally, rehydration is safe. However, one problem that can happen is taking in too much fluid (overhydration). This is rare. If overhydration happens, it can cause an electrolyte imbalance, kidney failure, or a decrease in salt (sodium) levels in the body. How to rehydrate Follow instructions from your health care provider for rehydration. The kind of fluid you should drink and the amount you should drink depend on your condition.  If directed by your health care provider, drink an oral rehydration solution (ORS). This is a drink designed to treat dehydration that is found in pharmacies and retail stores. ? Make an ORS by following instructions on the package. ? Start by drinking small amounts, about  cup (120 mL) every 5-10 minutes. ? Slowly increase how much you drink until you have taken the amount recommended by your health care provider.  Drink enough clear fluids to keep your urine clear or pale  yellow. If you were instructed to drink an ORS, finish the ORS first, then start slowly drinking other clear fluids. Drink fluids such as: ? Water. Do not drink only water. Doing that can lead to having too little sodium in your body (hyponatremia). ? Ice chips. ? Fruit juice that you have added water to (diluted juice). ? Low-calorie sports drinks.  If you are severely dehydrated, your health care provider may recommend that you receive fluids through an IV tube in the hospital.  Do not take sodium tablets. Doing that can lead to the condition of having too much sodium in your body (hypernatremia). Eating while you rehydrate Follow instructions from your health care provider about what to eat while you rehydrate. Your health care provider may recommend that you slowly begin eating regular foods in small amounts.  Eat foods that contain a healthy balance of electrolytes, such as bananas, oranges, potatoes, tomatoes, and spinach.  Avoid foods that are greasy or contain a lot of fat or sugar.  In some cases, you may get nutrition through a feeding tube that is passed through your nose and into your stomach (nasogastric tube, or NG tube). This may be done if you have uncontrolled vomiting or diarrhea. Beverages to avoid Certain beverages may make dehydration worse. While you rehydrate, avoid:  Alcohol.  Caffeine.  Drinks that contain a lot of sugar. These include: ? High-calorie sports drinks. ? Fruit juice that is not diluted. ? Soda.  Check nutrition labels to see how much sugar or caffeine a beverage contains. Signs of dehydration recovery You may be recovering from dehydration if:  You are urinating more often than before you started   rehydrating.  Your urine is clear or pale yellow.  Your energy level improves.  You vomit less frequently.  You have diarrhea less frequently.  Your appetite improves or returns to normal.  You feel less dizzy or less light-headed.  Your  skin tone and color start to look more normal. Contact a health care provider if:  You continue to have symptoms of mild dehydration, such as: ? Thirst. ? Dry lips. ? Slightly dry mouth. ? Dry, warm skin. ? Dizziness.  You continue to vomit or have diarrhea. Get help right away if:  You have symptoms of dehydration that get worse.  You feel: ? Confused. ? Weak. ? Like you are going to faint.  You have not urinated in 6-8 hours.  You have very dark urine.  You have trouble breathing.  Your heart rate while sitting still is over 100 beats a minute.  You cannot drink fluids without vomiting.  You have vomiting or diarrhea that: ? Gets worse. ? Does not go away.  You have a fever. This information is not intended to replace advice given to you by your health care provider. Make sure you discuss any questions you have with your health care provider. Document Released: 07/08/2011 Document Revised: 03/28/2017 Document Reviewed: 06/09/2015 Elsevier Patient Education  2020 [employer].   Constipation, Adult Constipation is when a person has fewer bowel movements in a week than normal, has difficulty having a bowel movement, or has stools that are dry, hard, or larger than normal. Constipation may be caused by an underlying condition. It may become worse with age if a person takes certain medicines and does not take in enough fluids. Follow these instructions at home: Eating and drinking   Eat foods that have a lot of fiber, such as fresh fruits and vegetables, whole grains, and beans.  Limit foods that are high in fat, low in fiber, or overly processed, such as french fries, hamburgers, cookies, candies, and soda.  Drink enough fluid to keep your urine clear or pale yellow. General instructions  Exercise regularly or as told by your health care provider.  Go to the restroom when you have the urge to go. Do not hold it in.  Take over-the-counter and prescription  medicines only as told by your health care provider. These include any fiber supplements.  Practice pelvic floor retraining exercises, such as deep breathing while relaxing the lower abdomen and pelvic floor relaxation during bowel movements.  Watch your condition for any changes.  Keep all follow-up visits as told by your health care provider. This is important. Contact a health care provider if:  You have pain that gets worse.  You have a fever.  You do not have a bowel movement after 4 days.  You vomit.  You are not hungry.  You lose weight.  You are bleeding from the anus.  You have thin, pencil-like stools. Get help right away if:  You have a fever and your symptoms suddenly get worse.  You leak stool or have blood in your stool.  Your abdomen is bloated.  You have severe pain in your abdomen.  You feel dizzy or you faint. This information is not intended to replace advice given to you by your health care provider. Make sure you discuss any questions you have with your health care provider. Document Released: 01/12/2004 Document Revised: 03/28/2017 Document Reviewed: 10/04/2015 Elsevier Patient Education  2020 [employer].

## 2019-03-03 NOTE — Progress Notes (Signed)
Error

## 2019-03-03 NOTE — Progress Notes (Signed)
Pt very tearful on arrival to infusion, states she tx last Wednesday & Neulast on Friday.  Has been having generalized body aches & soreness since her injection, also has decreased appetite & altered taste.  Having some constipation, is not taking any laxative or stool softener.  Would like to speak to provider.  [employer] PA informed.

## 2019-03-04 ENCOUNTER — Inpatient Hospital Stay: Payer: BC Managed Care – PPO

## 2019-03-05 ENCOUNTER — Other Ambulatory Visit: Payer: Self-pay

## 2019-03-05 ENCOUNTER — Inpatient Hospital Stay: Payer: BC Managed Care – PPO

## 2019-03-05 ENCOUNTER — Other Ambulatory Visit: Payer: Self-pay | Admitting: *Deleted

## 2019-03-05 VITALS — BP 106/73 | HR 100

## 2019-03-05 DIAGNOSIS — Z170 Estrogen receptor positive status [ER+]: Secondary | ICD-10-CM | POA: Diagnosis not present

## 2019-03-05 DIAGNOSIS — C50411 Malignant neoplasm of upper-outer quadrant of right female breast: Secondary | ICD-10-CM

## 2019-03-05 DIAGNOSIS — E860 Dehydration: Secondary | ICD-10-CM

## 2019-03-05 DIAGNOSIS — C50811 Malignant neoplasm of overlapping sites of right female breast: Secondary | ICD-10-CM | POA: Diagnosis not present

## 2019-03-05 DIAGNOSIS — Z79899 Other long term (current) drug therapy: Secondary | ICD-10-CM | POA: Diagnosis not present

## 2019-03-05 DIAGNOSIS — Z79811 Long term (current) use of aromatase inhibitors: Secondary | ICD-10-CM | POA: Diagnosis not present

## 2019-03-05 DIAGNOSIS — Z5189 Encounter for other specified aftercare: Secondary | ICD-10-CM | POA: Diagnosis not present

## 2019-03-05 LAB — COMPREHENSIVE METABOLIC PANEL
ALT: 15 U/L (ref 0–44)
AST: 37 U/L (ref 15–41)
Albumin: 3.1 g/dL — ABNORMAL LOW (ref 3.5–5.0)
Alkaline Phosphatase: 96 U/L (ref 38–126)
Anion gap: 10 (ref 5–15)
BUN: <4 mg/dL — ABNORMAL LOW (ref 6–20)
CO2: 21 mmol/L — ABNORMAL LOW (ref 22–32)
Calcium: 8.6 mg/dL — ABNORMAL LOW (ref 8.9–10.3)
Chloride: 109 mmol/L (ref 98–111)
Creatinine, Ser: 0.59 mg/dL (ref 0.44–1.00)
GFR calc Af Amer: >60 mL/min (ref >60)
GFR calc non Af Amer: >60 mL/min (ref >60)
Glucose, Bld: 97 mg/dL (ref 70–99)
Potassium: 3.6 mmol/L (ref 3.5–5.1)
Sodium: 140 mmol/L (ref 135–145)
Total Bilirubin: 0.4 mg/dL (ref 0.3–1.2)
Total Protein: 6.3 g/dL — ABNORMAL LOW (ref 6.5–8.1)

## 2019-03-05 LAB — CBC WITH DIFFERENTIAL/PLATELET
Abs Immature Granulocytes: 2.04 10*3/uL — ABNORMAL HIGH (ref 0.00–0.07)
Basophils Absolute: 0.1 10*3/uL (ref 0.0–0.1)
Basophils Relative: 1 %
Eosinophils Absolute: 0 10*3/uL (ref 0.0–0.5)
Eosinophils Relative: 0 %
HCT: 32.4 % — ABNORMAL LOW (ref 36.0–46.0)
Hemoglobin: 10.6 g/dL — ABNORMAL LOW (ref 12.0–15.0)
Immature Granulocytes: 14 %
Lymphocytes Relative: 8 %
Lymphs Abs: 1.2 10*3/uL (ref 0.7–4.0)
MCH: 32.2 pg (ref 26.0–34.0)
MCHC: 32.7 g/dL (ref 30.0–36.0)
MCV: 98.5 fL (ref 80.0–100.0)
Monocytes Absolute: 1.1 10*3/uL — ABNORMAL HIGH (ref 0.1–1.0)
Monocytes Relative: 7 %
Neutro Abs: 10.5 10*3/uL — ABNORMAL HIGH (ref 1.7–7.7)
Neutrophils Relative %: 70 %
Platelets: 243 10*3/uL (ref 150–400)
RBC: 3.29 MIL/uL — ABNORMAL LOW (ref 3.87–5.11)
RDW: 17.1 % — ABNORMAL HIGH (ref 11.5–15.5)
WBC: 14.9 10*3/uL — ABNORMAL HIGH (ref 4.0–10.5)
nRBC: 0.9 % — ABNORMAL HIGH (ref 0.0–0.2)

## 2019-03-05 MED ORDER — SODIUM CHLORIDE 0.9 % IV SOLN
INTRAVENOUS | Status: AC
  Administered 2019-03-05: 16:00:00 via INTRAVENOUS
  Filled 2019-03-05 (×2): qty 250

## 2019-03-05 NOTE — Patient Instructions (Signed)
Rehydration, Adult Rehydration is the replacement of body fluids and salts and minerals (electrolytes) that are lost during dehydration. Dehydration is when there is not enough fluid or water in the body. This happens when you lose more fluids than you take in. Common causes of dehydration include:  Vomiting.  Diarrhea.  Excessive sweating, such as from heat exposure or exercise.  Taking medicines that cause the body to lose excess fluid (diuretics).  Impaired kidney function.  Not drinking enough fluid.  Certain illnesses or infections.  Certain poorly controlled long-term (chronic) illnesses, such as diabetes, heart disease, and kidney disease.  Symptoms of mild dehydration may include thirst, dry lips and mouth, dry skin, and dizziness. Symptoms of severe dehydration may include increased heart rate, confusion, fainting, and not urinating. You can rehydrate by drinking certain fluids or getting fluids through an IV tube, as told by your health care provider. What are the risks? Generally, rehydration is safe. However, one problem that can happen is taking in too much fluid (overhydration). This is rare. If overhydration happens, it can cause an electrolyte imbalance, kidney failure, or a decrease in salt (sodium) levels in the body. How to rehydrate Follow instructions from your health care provider for rehydration. The kind of fluid you should drink and the amount you should drink depend on your condition.  If directed by your health care provider, drink an oral rehydration solution (ORS). This is a drink designed to treat dehydration that is found in pharmacies and retail stores. ? Make an ORS by following instructions on the package. ? Start by drinking small amounts, about  cup (120 mL) every 5-10 minutes. ? Slowly increase how much you drink until you have taken the amount recommended by your health care provider.  Drink enough clear fluids to keep your urine clear or pale  yellow. If you were instructed to drink an ORS, finish the ORS first, then start slowly drinking other clear fluids. Drink fluids such as: ? Water. Do not drink only water. Doing that can lead to having too little sodium in your body (hyponatremia). ? Ice chips. ? Fruit juice that you have added water to (diluted juice). ? Low-calorie sports drinks.  If you are severely dehydrated, your health care provider may recommend that you receive fluids through an IV tube in the hospital.  Do not take sodium tablets. Doing that can lead to the condition of having too much sodium in your body (hypernatremia). Eating while you rehydrate Follow instructions from your health care provider about what to eat while you rehydrate. Your health care provider may recommend that you slowly begin eating regular foods in small amounts.  Eat foods that contain a healthy balance of electrolytes, such as bananas, oranges, potatoes, tomatoes, and spinach.  Avoid foods that are greasy or contain a lot of fat or sugar.  In some cases, you may get nutrition through a feeding tube that is passed through your nose and into your stomach (nasogastric tube, or NG tube). This may be done if you have uncontrolled vomiting or diarrhea. Beverages to avoid Certain beverages may make dehydration worse. While you rehydrate, avoid:  Alcohol.  Caffeine.  Drinks that contain a lot of sugar. These include: ? High-calorie sports drinks. ? Fruit juice that is not diluted. ? Soda.  Check nutrition labels to see how much sugar or caffeine a beverage contains. Signs of dehydration recovery You may be recovering from dehydration if:  You are urinating more often than before you started   rehydrating.  Your urine is clear or pale yellow.  Your energy level improves.  You vomit less frequently.  You have diarrhea less frequently.  Your appetite improves or returns to normal.  You feel less dizzy or less light-headed.  Your  skin tone and color start to look more normal. Contact a health care provider if:  You continue to have symptoms of mild dehydration, such as: ? Thirst. ? Dry lips. ? Slightly dry mouth. ? Dry, warm skin. ? Dizziness.  You continue to vomit or have diarrhea. Get help right away if:  You have symptoms of dehydration that get worse.  You feel: ? Confused. ? Weak. ? Like you are going to faint.  You have not urinated in 6-8 hours.  You have very dark urine.  You have trouble breathing.  Your heart rate while sitting still is over 100 beats a minute.  You cannot drink fluids without vomiting.  You have vomiting or diarrhea that: ? Gets worse. ? Does not go away.  You have a fever. This information is not intended to replace advice given to you by your health care provider. Make sure you discuss any questions you have with your health care provider. Document Released: 07/08/2011 Document Revised: 03/28/2017 Document Reviewed: 06/09/2015 Elsevier Patient Education  2020 [employer]  Coronavirus (COVID-19) Are you at risk?  Are you at risk for the Coronavirus (COVID-19)?  To be considered HIGH RISK for Coronavirus (COVID-19), you have to meet the following criteria:  . Traveled to [place], [place], [place], [place] or [place]; or in the [place] to [place], [place], [place], or [place]; and have fever, cough, and shortness of breath within the last 2 weeks of travel OR . Been in close contact with a person diagnosed with COVID-19 within the last 2 weeks and have fever, cough, and shortness of breath . IF YOU DO NOT MEET THESE CRITERIA, YOU ARE CONSIDERED LOW RISK FOR COVID-19.  What to do if you are HIGH RISK for COVID-19?  . If you are having a medical emergency, call 911. . Seek medical care right away. Before you go to a doctor's office, urgent care or emergency department, call ahead and tell them about your recent travel, contact with someone diagnosed  with COVID-19, and your symptoms. You should receive instructions from your physician's office regarding next steps of care.  . When you arrive at healthcare provider, tell the healthcare staff immediately you have returned from visiting [place], [place], [place], [place] or [place]; or traveled in the [place] to [place], [place], [place], or [place]; in the last two weeks or you have been in close contact with a person diagnosed with COVID-19 in the last 2 weeks.   . Tell the health care staff about your symptoms: fever, cough and shortness of breath. . After you have been seen by a medical provider, you will be either: o Tested for (COVID-19) and discharged home on quarantine except to seek medical care if symptoms worsen, and asked to  - Stay home and avoid contact with others until you get your results (4-5 days)  - Avoid travel on public transportation if possible (such as bus, train, or airplane) or o Sent to the Emergency Department by EMS for evaluation, COVID-19 testing, and possible admission depending on your condition and test results.  What to do if you are LOW RISK for COVID-19?  Reduce your risk of any infection by using the same precautions   used for avoiding the common cold or flu:  . Wash your hands often with soap and warm water for at least 20 seconds.  If soap and water are not readily available, use an alcohol-based hand sanitizer with at least 60% alcohol.  . If coughing or sneezing, cover your mouth and nose by coughing or sneezing into the elbow areas of your shirt or coat, into a tissue or into your sleeve (not your hands). . Avoid shaking hands with others and consider head nods or verbal greetings only. . Avoid touching your eyes, nose, or mouth with unwashed hands.  . Avoid close contact with people who are sick. . Avoid places or events with large numbers of people in one location, like concerts or sporting events. . Carefully consider travel plans you have  or are making. . If you are planning any travel outside or inside the [place], visit the CDC's Travelers' Health webpage for the latest health notices. . If you have some symptoms but not all symptoms, continue to monitor at home and seek medical attention if your symptoms worsen. . If you are having a medical emergency, call 911.   ADDITIONAL HEALTHCARE OPTIONS FOR PATIENTS  [facility] / e-Visit: [email or web address]         [facility]: [phone]  [facility]: [phone]                   [facility]: [phone]   

## 2019-03-10 NOTE — Progress Notes (Signed)
[facility]  [phone] [phone]    ID: Alice [name] DOB: November 28, 1960  MR#: [id number]  VFI#:[id number]  Patient Care Team: [name], MD as PCP - General (Family Medicine) Mauro Kaufmann, RN as Oncology Nurse Navigator Rockwell Germany, RN as Oncology Nurse Navigator , Virgie Dad, MD as Consulting Physician (Oncology) Kyung Rudd, MD as Consulting Physician (Radiation Oncology) Rolm Bookbinder, MD as Consulting Physician (General Surgery) Earnstine Regal, PA-C as Consulting Physician (Obstetrics and Gynecology) Juanita Craver, MD as Consulting Physician (Gastroenterology) [name], MD as Consulting Physician (Family Medicine) Cristine Polio, MD as Consulting Physician (Plastic Surgery) Chauncey Cruel, MD OTHER MD:  CHIEF COMPLAINT: Estrogen receptor positive breast cancer  CURRENT TREATMENT: To resume anastrozole and start Prolia   INTERVAL HISTORY: Alice [name] was seen today for follow up of her estrogen receptor positive breast cancer.  She completed her adjuvant chemotherapy, with Docetaxel and Cyclophosphamide given every 21 days x 4 cycles, on 02/24/2019.  This was very difficult for her both physically and emotionally but she put herself through it and it is not a real accomplishment.  Since her last visit, she has not undergone any additional studies. She will be due for repeat mammography in 08/2019.   REVIEW OF SYSTEMS: Alice [name] tells me her hair has not yet begun to grow back.  Of course it has been less than 3 weeks since her final chemotherapy dose.  Her legs hurt.  They are not swollen.  She is able to walk although she is not exercising regularly or for any length of time because she is still feeling on the weak side.  She continues to have tearing.  She did obtain the Restasis eyedrops but it burned her eyes a little when she received them and she stopped after 1 dose.  She wonders if the way she feels  now is the way she is going to feel forever.  She is having discomfort in the left breast and this concerns her greatly.  She wonders if she should have a mammogram there and/or whether she should have a PET scan at this point.  A detailed review of systems today was otherwise stable  HISTORY OF CURRENT ILLNESS: From the original intake note:  Alice [name] has a history of remote right breast cancer in 1997, treated with right lumpectomy and radiation in [facility].  The patient did not receive chemotherapy or antiestrogen treatments.  More recently she presented for her annual mammogram with a non-tender area of firmness in the right breast at 9 o'clock for 2 months. She underwent bilateral diagnostic mammography with tomography and right breast ultrasonography at [facility] on 09/08/2018 showing: breast density category A; scattered-appearing fibroglandular tissue at the lumpectomy site in the upper outer right breast middle depth spanning 3.4 cm, with indistinct margins, was not seen on prior mammogram from 2018.  There was also a keloid scar in the inferior medial left breast.  On physical exam there was a rectangular area of hardness measuring up to 7 cm in the right breast at 9:00, corresponding with the prior lumpectomy and consistent with radiation change.  Ultrasound of this area showed only postlumpectomy change, with no suspicious or discrete mass.  Ultrasound of the axilla was benign  Accordingly on 09/23/2018 she proceeded to biopsy of the right breast area in question. The pathology from this procedure SAA 20-07/02/2000 showed: invasive ductal carcinoma, E-cadherin positive,  grade 3,  Prognostic indicators significant for: estrogen receptor at 100% positive and progesterone  receptor, 30% positive, both with strong staining intensity proliferation marker Ki67 at 70%. HER2 negative by immunohistochemistry (0).  The patient's subsequent history is as detailed below.   PAST MEDICAL  HISTORY: Past Medical History:  Diagnosis Date  . Anemia   . Breast cancer ([place])    right in 1997  . Family history of lung cancer   . Family history of ovarian cancer   . GERD (gastroesophageal reflux disease) 06/30/2018  . Herpes simplex type 2 infection 02/2006  . History of breast cancer 1997  . History of esophagogastroduodenoscopy (EGD) 09/15/2015   small hiatal hernia was noted otherwise all was normal  . Personal history of breast cancer     PAST SURGICAL HISTORY: Past Surgical History:  Procedure Laterality Date  . BREAST ENHANCEMENT SURGERY     s/p cancer and surgery, right  . BREAST LUMPECTOMY     right due to breast cancer  . MASTECTOMY W/ SENTINEL NODE BIOPSY Right 10/22/2018   Procedure: RIGHT MASTECTOMY WITH RIGHT AXILLARY SENTINEL LYMPH NODE BIOPSY;  Surgeon: Rolm Bookbinder, MD;  Location: [facility];  Service: General;  Laterality: Right;  wisdom teeth removal   FAMILY HISTORY: Family History  Problem Relation Age of Onset  . Ovarian cancer Sister 14  . Dementia Mother   . Aneurysm Father    Patient's father was 62 years old when he died from aneurysm. Patient's mother died from dementia at age 68. Her sister was diagnosed with ovarian cancer at age 47, and this subsequently took her life.  In total of the patient has 5 brothers and 2 sisters.  She is not aware of other cancers in the family  GYNECOLOGIC HISTORY:  No LMP recorded. Patient is postmenopausal. Menarche: 58 years old [place] P 0 LMP age 24 Contraceptive none HRT none  Hysterectomy? no BSO? 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 She is married. Husband Edmonia Caprio is from [place], so his native language is [place]. She lives at home with her husband. They have no children. She attends a [employer] in [place].    ADVANCED DIRECTIVES: In the absence of documents to the contrary her husband is  automatically her HCPOA.   HEALTH MAINTENANCE: Social History   Tobacco Use  . Smoking status: Never Smoker  . Smokeless tobacco: Never Used  Substance Use Topics  . Alcohol use: No    Alcohol/week: 0.0 standard drinks  . Drug use: No     Colonoscopy: endoscopy in 2017, colonoscopy due 2020 but delayed due to virus  PAP: 12/03/2017, normal  Bone density: never done   No Known Allergies  Current Outpatient Medications  Medication Sig Dispense Refill  . anastrozole (ARIMIDEX) 1 MG tablet Take 1 tablet (1 mg total) by mouth daily. 90 tablet 4  . clotrimazole-betamethasone (LOTRISONE) cream Apply 1 application topically 2 (two) times daily. 45 g 1  . cycloSPORINE (RESTASIS) 0.05 % ophthalmic emulsion Place 1 drop into both eyes 2 (two) times daily. 0.4 mL 1  . dexamethasone (DECADRON) 4 MG tablet Take 2 tablets (8 mg total) by mouth 2 (two) times daily. Start the day before Taxotere. Then again the day after chemo for 3 days. 30 tablet 1  . gabapentin (NEURONTIN) 300 MG capsule Take 1 capsule (300 mg total) by mouth at bedtime. 90 capsule 4  . lidocaine-prilocaine (EMLA) cream Apply to affected area once 30 g 3  . LORazepam (ATIVAN)  0.5 MG tablet Take 1 tablet (0.5 mg total) by mouth at bedtime as needed (Nausea or vomiting). 30 tablet 0  . magic mouthwash SOLN 5 to 10 ml swish, gargle and spit QID prn mouth soreness. 240 mL 1  . omeprazole (PRILOSEC) 40 MG capsule Take 1 capsule (40 mg total) by mouth daily. 30 capsule 5  . prochlorperazine (COMPAZINE) 10 MG tablet Take 1 tablet (10 mg total) by mouth every 6 (six) hours as needed (Nausea or vomiting). 30 tablet 1   No current facility-administered medications for this visit.     OBJECTIVE: Middle-aged African-American woman who appears stated age  51:   03/11/19 1247  BP: 117/69  Pulse: 84  Temp: 98.7 F (37.1 C)  Resp: 18  Height: _0  (1.626 m)  Weight: 175 lb 1.6 oz (79.4 kg)  SpO2: 98%  TempSrc: Temporal  BMI  (Calculated): 30.04    Filed Weights   03/11/19 1247  Weight: 175 lb 1.6 oz (79.4 kg)   ECOG: 1  Sclerae unicteric, EOMs intact Wearing a mask No cervical or supraclavicular adenopathy Lungs no rales or rhonchi Heart regular rate and rhythm Abd soft, nontender, positive bowel sounds MSK no focal spinal tenderness, no upper extremity lymphedema Neuro: nonfocal, well oriented, appropriate affect Breasts: Status post right mastectomy.  The incision is healing very nicely, it is quite flat, there is no swelling, erythema, or dehiscence.  Left breast is benign.  Both axillae are benign.   LAB RESULTS:  CMP     Component Value Date/Time   NA 140 03/11/2019 1225   K 3.9 03/11/2019 1225   CL 108 03/11/2019 1225   CO2 24 03/11/2019 1225   GLUCOSE 89 03/11/2019 1225   BUN 4 (L) 03/11/2019 1225   CREATININE 0.57 03/11/2019 1225   CREATININE 0.62 02/10/2019 1229   CALCIUM 8.3 (L) 03/11/2019 1225   PROT 5.8 (L) 03/11/2019 1225   ALBUMIN 2.9 (L) 03/11/2019 1225   AST 22 03/11/2019 1225   AST 49 (H) 02/10/2019 1229   ALT 7 03/11/2019 1225   ALT 23 02/10/2019 1229   ALKPHOS 103 03/11/2019 1225   BILITOT 0.4 03/11/2019 1225   BILITOT 0.4 02/10/2019 1229   GFRNONAA >60 03/11/2019 1225   GFRNONAA >60 02/10/2019 1229   GFRAA >60 03/11/2019 1225   GFRAA >60 02/10/2019 1229    No results found for: TOTALPROTELP, ALBUMINELP, A1GS, A2GS, BETS, BETA2SER, GAMS, MSPIKE, SPEI  No results found for: KPAFRELGTCHN, LAMBDASER, KAPLAMBRATIO  Lab Results  Component Value Date   WBC 13.0 (H) 03/11/2019   NEUTROABS 10.4 (H) 03/11/2019   HGB 10.5 (L) 03/11/2019   HCT 32.8 (L) 03/11/2019   MCV 102.2 (H) 03/11/2019   PLT 304 03/11/2019    _1 @  No results found for: LABCA2  No components found for: KGYJEH631  No results for input(s): INR in the last 168 hours.  No results found for: LABCA2  No results found for: SHF026  No results found for: VZC588  No results found  for: FOY774  No results found for: CA2729  No components found for: HGQUANT  No results found for: CEA1 / No results found for: CEA1   No results found for: AFPTUMOR  No results found for: [place]  No results found for: PSA1  Appointment on 03/11/2019  Component Date Value Ref Range Status  . Sodium 03/11/2019 140  135 - 145 mmol/L Final  . Potassium 03/11/2019 3.9  3.5 - 5.1 mmol/L Final  . Chloride 03/11/2019  108  98 - 111 mmol/L Final  . CO2 03/11/2019 24  22 - 32 mmol/L Final  . Glucose, Bld 03/11/2019 89  70 - 99 mg/dL Final  . BUN 03/11/2019 4* 6 - 20 mg/dL Final  . Creatinine, Ser 03/11/2019 0.57  0.44 - 1.00 mg/dL Final  . Calcium 03/11/2019 8.3* 8.9 - 10.3 mg/dL Final  . Total Protein 03/11/2019 5.8* 6.5 - 8.1 g/dL Final  . Albumin 03/11/2019 2.9* 3.5 - 5.0 g/dL Final  . AST 03/11/2019 22  15 - 41 U/L Final  . ALT 03/11/2019 7  0 - 44 U/L Final  . Alkaline Phosphatase 03/11/2019 103  38 - 126 U/L Final  . Total Bilirubin 03/11/2019 0.4  0.3 - 1.2 mg/dL Final  . GFR calc non Af Amer 03/11/2019 >60  >60 mL/min Final  . GFR calc Af Amer 03/11/2019 >60  >60 mL/min Final  . Anion gap 03/11/2019 8  5 - 15 Final   Performed at [facility], [place] [address] [place], [place] [address]  . WBC 03/11/2019 13.0* 4.0 - 10.5 K/uL Final  . RBC 03/11/2019 3.21* 3.87 - 5.11 MIL/uL Final  . Hemoglobin 03/11/2019 10.5* 12.0 - 15.0 g/dL Final  . HCT 03/11/2019 32.8* 36.0 - 46.0 % Final  . MCV 03/11/2019 102.2* 80.0 - 100.0 fL Final  . MCH 03/11/2019 32.7  26.0 - 34.0 pg Final  . MCHC 03/11/2019 32.0  30.0 - 36.0 g/dL Final  . RDW 03/11/2019 18.3* 11.5 - 15.5 % Final  . Platelets 03/11/2019 304  150 - 400 K/uL Final  . nRBC 03/11/2019 0.2  0.0 - 0.2 % Final  . Neutrophils Relative % 03/11/2019 80  % Final  . Neutro Abs 03/11/2019 10.4* 1.7 - 7.7 K/uL Final  . Lymphocytes Relative 03/11/2019 8  % Final  . Lymphs Abs 03/11/2019 1.1  0.7 - 4.0 K/uL Final  .  Monocytes Relative 03/11/2019 7  % Final  . Monocytes Absolute 03/11/2019 0.9  0.1 - 1.0 K/uL Final  . Eosinophils Relative 03/11/2019 0  % Final  . Eosinophils Absolute 03/11/2019 0.0  0.0 - 0.5 K/uL Final  . Basophils Relative 03/11/2019 0  % Final  . Basophils Absolute 03/11/2019 0.0  0.0 - 0.1 K/uL Final  . Immature Granulocytes 03/11/2019 5  % Final  . Abs Immature Granulocytes 03/11/2019 0.60* 0.00 - 0.07 K/uL Final   Performed at [facility], [place] [address], [place], [place] [address]  . Vit D, 25-Hydroxy 03/11/2019 6.43* 30 - 100 ng/mL Final   Comment: (NOTE) Vitamin D deficiency has been defined by the [place] practice guideline as a level of serum 25-OH  vitamin D less than 20 ng/mL (1,2). The Endocrine Society went on to  further define vitamin D insufficiency as a level between 21 and 29  ng/mL (2). 1. IOM (Institute of Medicine). 2010. Dietary reference intakes for  calcium and D. [place]: The [employer]. 2. Holick MF, Binkley [place], Bischoff-Ferrari HA, et al. Evaluation,  treatment, and prevention of vitamin D deficiency: an Endocrine  Society clinical practice guideline, JCEM. 2011 Jul; 96(7): 1911-30. Performed at [facility], [place] [address], [place], [place] [address]     (this displays the last labs from the last 3 days)  No results found for: TOTALPROTELP, ALBUMINELP, A1GS, A2GS, BETS, BETA2SER, GAMS, MSPIKE, SPEI (this displays SPEP labs)  No results found for: KPAFRELGTCHN, LAMBDASER,  KAPLAMBRATIO (kappa/lambda light chains)  No results found for: HGBA, HGBA2QUANT, HGBFQUANT, HGBSQUAN (Hemoglobinopathy evaluation)   No results found for: LDH  No results found for: IRON, TIBC, IRONPCTSAT (Iron and TIBC)  No results found for: FERRITIN  Urinalysis    Component Value Date/Time   COLORURINE YELLOW 12/30/2018 [place] 12/30/2018 0948   LABSPEC  1.004 (L) 12/30/2018 0948   PHURINE 8.0 12/30/2018 [place] 12/30/2018 0948   HGBUR NEGATIVE 12/30/2018 0948   BILIRUBINUR NEGATIVE 12/30/2018 0948   KETONESUR NEGATIVE 12/30/2018 0948   PROTEINUR NEGATIVE 12/30/2018 0948   NITRITE NEGATIVE 12/30/2018 0948   LEUKOCYTESUR NEGATIVE 12/30/2018 0948    STUDIES: No results found.    ELIGIBLE FOR AVAILABLE RESEARCH PROTOCOL: no  ASSESSMENT: 58 y.o. [place] woman   (1) status post right lumpectomy in 1997,  (a) status post adjuvant radiation  (b) did not receive antiestrogens or chemotherapy  (2) status post right breast upper outer quadrant biopsy 09/23/2018 for a clinical T2 N0, stage IIA invasive ductal carcinoma, grade 3, estrogen and progesterone receptor positive, HER-2 not amplified, with an MIB-1 of 70%  (3) Oncotype obtained from the initial biopsy showed a score of 26 predicting a risk of recurrence outside the breast of 16/21% (depending on nodal status), if the patient's only systemic therapy is an antiestrogen for 5 years.  It also predicts a significant benefit from chemotherapy  (4) status post right mastectomy and sentinel lymph node sampling 10/22/2018 for a pT2 pN0, stage IB invasive ductal carcinoma, grade 2, with negative margins  (a) not planning reconstruction at this point  (5) adjuvant chemotherapy recommended on 11/05/2018 by Dr. Jana Hakim  (a) second opinion with Dr. Annabell Sabal at [facility] on 11/18/2018 recommended 4 cycles of adjuvant docetaxel and cyclophosphamide   (b) Third opinion with Dr. Adan Sis on 11/24/2018 recommended 4 cycles of adjuvant Docetaxel and Cyclophosphamide  (c) started adjuvant chemotherapy with docetaxel and cyclophosphamide on 12/23/2018, completed 4 cycles 02/24/2019  (6) anastrozole started 09/30/2018 (patient stopped due to concerns over bone loss--to restart once adjuvant therapy has completed)  (a) Bone density on 12/10/2018 shows a T score of -3.8 in the AP spine:  Osteoporosis  (7) genetics testing 10/06/2018 through the [facility] Common Hereditary Cancers Panel found no deleterious mutations in APC, ATM, AXIN2, BARD1, BMPR1A, BRCA1, BRCA2, BRIP1, CDH1, CDKN2A (p14ARF), CDKN2A (p16INK4a), CKD4, CHEK2, CTNNA1, DICER1, EPCAM (Deletion/duplication testing only), GREM1 (promoter region deletion/duplication testing only), KIT, MEN1, MLH1, MSH2, MSH3, MSH6, MUTYH, NBN, NF1, NHTL1, PALB2, PDGFRA, PMS2, POLD1, POLE, PTEN, RAD50, RAD51C, RAD51D, SDHB, SDHC, SDHD, SMAD4, SMARCA4. STK11, TP53, TSC1, TSC2, and VHL. The following genes were evaluated for sequence changes only: SDHA and HOXB13 c.251G>A variant only.   (a) 2 VUS's identified: VUS in APC c.-34014C>T and VHL c.340+272T>G identified   PLAN: Alice [name] is 2 weeks out from her chemotherapy.  She is not feeling well, with multiple symptoms related to her recent treatment.  She is anxious that this is may continue for years and years and never resolved.  I reassured her that she is still in the acute phase of treatment, and that things are going to get better steadily so that 2 months from now she will be feeling considerably improved  She is still having epiphora.  She is not using the Restasis eyedrops because they burn a little bit when she drops them in.  I suggested she continue them for a week despite the discomfort and reassured her that the discomfort  is not a significant issue but she does not want to continue that.  Accordingly I am referring her to ophthalmology for consideration of canalization of her lacrimal ducts  I do not think we are ready to start anastrozole much less denosumab with all the symptoms she is experiencing.  She is going to return to see me the second week in December and hopefully by then we can start anastrozole.  We will also discussed denosumab/Xgeva.  Likely that we will start in January  I am setting her up for ultrasound of the left breast.  I am trying to avoid additional radiation to  the right breast with mammography.  I spent approximately 30 minutes face to face with Alice [name] with more than 50% of that time spent in counseling and coordination of care.  She knows to call for any other issue that may develop before her next visit.   Virgie Dad. , MD  03/11/2019 4:13 PM Medical Oncology and Hematology [facility] [place], [place] [address] Tel. [phone]    Fax. [phone]   I, Wilburn Mylar, am acting as scribe for Dr. Virgie Dad. .  I, Lurline Del MD, have reviewed the above documentation for accuracy and completeness, and I agree with the above.

## 2019-03-11 ENCOUNTER — Encounter: Payer: Self-pay | Admitting: Oncology

## 2019-03-11 ENCOUNTER — Other Ambulatory Visit: Payer: Self-pay

## 2019-03-11 ENCOUNTER — Inpatient Hospital Stay: Payer: BC Managed Care – PPO

## 2019-03-11 ENCOUNTER — Inpatient Hospital Stay (HOSPITAL_BASED_OUTPATIENT_CLINIC_OR_DEPARTMENT_OTHER): Payer: BC Managed Care – PPO | Admitting: Oncology

## 2019-03-11 VITALS — BP 117/69 | HR 84 | Temp 98.7°F | Resp 18 | Ht 64.0 in | Wt 175.1 lb

## 2019-03-11 DIAGNOSIS — C50411 Malignant neoplasm of upper-outer quadrant of right female breast: Secondary | ICD-10-CM

## 2019-03-11 DIAGNOSIS — C50811 Malignant neoplasm of overlapping sites of right female breast: Secondary | ICD-10-CM | POA: Diagnosis not present

## 2019-03-11 DIAGNOSIS — Z5189 Encounter for other specified aftercare: Secondary | ICD-10-CM | POA: Diagnosis not present

## 2019-03-11 DIAGNOSIS — Z79899 Other long term (current) drug therapy: Secondary | ICD-10-CM | POA: Diagnosis not present

## 2019-03-11 DIAGNOSIS — Z170 Estrogen receptor positive status [ER+]: Secondary | ICD-10-CM

## 2019-03-11 DIAGNOSIS — Z79811 Long term (current) use of aromatase inhibitors: Secondary | ICD-10-CM | POA: Diagnosis not present

## 2019-03-11 LAB — CBC WITH DIFFERENTIAL/PLATELET
Abs Immature Granulocytes: 0.6 10*3/uL — ABNORMAL HIGH (ref 0.00–0.07)
Basophils Absolute: 0 10*3/uL (ref 0.0–0.1)
Basophils Relative: 0 %
Eosinophils Absolute: 0 10*3/uL (ref 0.0–0.5)
Eosinophils Relative: 0 %
HCT: 32.8 % — ABNORMAL LOW (ref 36.0–46.0)
Hemoglobin: 10.5 g/dL — ABNORMAL LOW (ref 12.0–15.0)
Immature Granulocytes: 5 %
Lymphocytes Relative: 8 %
Lymphs Abs: 1.1 10*3/uL (ref 0.7–4.0)
MCH: 32.7 pg (ref 26.0–34.0)
MCHC: 32 g/dL (ref 30.0–36.0)
MCV: 102.2 fL — ABNORMAL HIGH (ref 80.0–100.0)
Monocytes Absolute: 0.9 10*3/uL (ref 0.1–1.0)
Monocytes Relative: 7 %
Neutro Abs: 10.4 10*3/uL — ABNORMAL HIGH (ref 1.7–7.7)
Neutrophils Relative %: 80 %
Platelets: 304 10*3/uL (ref 150–400)
RBC: 3.21 MIL/uL — ABNORMAL LOW (ref 3.87–5.11)
RDW: 18.3 % — ABNORMAL HIGH (ref 11.5–15.5)
WBC: 13 10*3/uL — ABNORMAL HIGH (ref 4.0–10.5)
nRBC: 0.2 % (ref 0.0–0.2)

## 2019-03-11 LAB — COMPREHENSIVE METABOLIC PANEL
ALT: 7 U/L (ref 0–44)
AST: 22 U/L (ref 15–41)
Albumin: 2.9 g/dL — ABNORMAL LOW (ref 3.5–5.0)
Alkaline Phosphatase: 103 U/L (ref 38–126)
Anion gap: 8 (ref 5–15)
BUN: 4 mg/dL — ABNORMAL LOW (ref 6–20)
CO2: 24 mmol/L (ref 22–32)
Calcium: 8.3 mg/dL — ABNORMAL LOW (ref 8.9–10.3)
Chloride: 108 mmol/L (ref 98–111)
Creatinine, Ser: 0.57 mg/dL (ref 0.44–1.00)
GFR calc Af Amer: >60 mL/min (ref >60)
GFR calc non Af Amer: >60 mL/min (ref >60)
Glucose, Bld: 89 mg/dL (ref 70–99)
Potassium: 3.9 mmol/L (ref 3.5–5.1)
Sodium: 140 mmol/L (ref 135–145)
Total Bilirubin: 0.4 mg/dL (ref 0.3–1.2)
Total Protein: 5.8 g/dL — ABNORMAL LOW (ref 6.5–8.1)

## 2019-03-11 LAB — VITAMIN D 25 HYDROXY (VIT D DEFICIENCY, FRACTURES): Vit D, 25-Hydroxy: 6.43 ng/mL — ABNORMAL LOW (ref 30–100)

## 2019-03-15 ENCOUNTER — Telehealth: Payer: Self-pay | Admitting: Oncology

## 2019-03-15 NOTE — Telephone Encounter (Signed)
I left a message regarding schedule  

## 2019-03-17 ENCOUNTER — Other Ambulatory Visit: Payer: Self-pay

## 2019-03-17 ENCOUNTER — Ambulatory Visit
Admission: RE | Admit: 2019-03-17 | Discharge: 2019-03-17 | Disposition: A | Discharge status: Home / Self Care | Payer: BC Managed Care – PPO | Source: Ambulatory Visit | Attending: Oncology | Admitting: Oncology

## 2019-03-17 ENCOUNTER — Other Ambulatory Visit: Payer: Self-pay | Admitting: Oncology

## 2019-03-17 DIAGNOSIS — Z170 Estrogen receptor positive status [ER+]: Secondary | ICD-10-CM

## 2019-03-17 DIAGNOSIS — H04123 Dry eye syndrome of bilateral lacrimal glands: Secondary | ICD-10-CM | POA: Diagnosis not present

## 2019-03-17 DIAGNOSIS — R928 Other abnormal and inconclusive findings on diagnostic imaging of breast: Secondary | ICD-10-CM | POA: Diagnosis not present

## 2019-03-17 DIAGNOSIS — C50411 Malignant neoplasm of upper-outer quadrant of right female breast: Secondary | ICD-10-CM

## 2019-03-18 DIAGNOSIS — K5904 Chronic idiopathic constipation: Secondary | ICD-10-CM | POA: Diagnosis not present

## 2019-03-19 ENCOUNTER — Telehealth: Payer: Self-pay | Admitting: *Deleted

## 2019-03-19 ENCOUNTER — Other Ambulatory Visit: Payer: Self-pay | Admitting: *Deleted

## 2019-03-21 NOTE — Progress Notes (Signed)
Virtual Visit via Video Note The purpose of this virtual visit is to provide medical care while limiting exposure to the novel coronavirus.    Consent was obtained for video visit:  Yes Answered questions that patient had about telehealth interaction:  Yes I discussed the limitations, risks, security and privacy concerns of performing an evaluation and management service by telemedicine. I also discussed with the patient that there may be a patient responsible charge related to this service. The patient expressed understanding and agreed to proceed.  Pt location: Home Physician Location: office Name of referring provider:  Koirala, Dibas, MD I connected with Alice [name] at patients initiation/request on 03/22/2019 at  1:50 PM EST by video enabled telemedicine application and verified that I am speaking with the correct person using two identifiers. Pt MRN:  [id number] Pt DOB:  January 22, 1961 Video Participants:  Alice [name]   History of Present Illness:  Alice [name] is a 59 year old female with right sided breast cancer (just finished chemotherapy) who presents for neck pain.  History supplemented by referring provider note.  About 2 months ago, she started experiencing 6-7/10 aching and sharp pain in left posterior side of her neck.  It lasts a few seconds and occurs 2 to 3 times a week.  She reports tingling in the fingertips of both hands which is attributed to chemotherapy.  However, she denies radicular pain, weakness or radicular numbness in the upper extremities.  No diplopia or dysphagia.  No preceding trauma.  It it gets too bad, she takes an extra-strength Tylenol, which is effective.  MRI of brain without contrast from 08/19/2018 was personally reviewed and was unremarkable except mild chronic small vessel ischemic changes and for cerebellar tonsillar ectopia extending 7 mm below the foramen magnum, consistent with a Chiari 1 malformation with no  evidence of compression.   Current NSAIDS:/steroids  Decadron 8mg  twice daily (with chemo) Current analgesics:  Extra-strength Tylenol Current triptans:  none Current ergotamine:  none Current anti-emetic:  Compazine 10mg  Current muscle relaxants:  none Current anti-anxiolytic:  none Current sleep aide:  none Current Antihypertensive medications:  none Current Antidepressant medications:  none Current Anticonvulsant medications:  none Current anti-CGRP:  none Current Vitamins/Herbal/Supplements:  none Current Antihistamines/Decongestants:  none Other therapy:  none Hormone/birth control:  none   Past Medical History: Past Medical History:  Diagnosis Date  . Anemia   . Breast cancer ([place])    right in 1997  . Family history of lung cancer   . Family history of ovarian cancer   . GERD (gastroesophageal reflux disease) 06/30/2018  . Herpes simplex type 2 infection 02/2006  . History of breast cancer 1997  . History of esophagogastroduodenoscopy (EGD) 09/15/2015   small hiatal hernia was noted otherwise all was normal  . Personal history of breast cancer     Medications: Outpatient Encounter Medications as of 03/22/2019  Medication Sig  . anastrozole (ARIMIDEX) 1 MG tablet Take 1 tablet (1 mg total) by mouth daily.  . clotrimazole-betamethasone (LOTRISONE) cream Apply 1 application topically 2 (two) times daily.  . cycloSPORINE (RESTASIS) 0.05 % ophthalmic emulsion Place 1 drop into both eyes 2 (two) times daily.  Marland Kitchen dexamethasone (DECADRON) 4 MG tablet Take 2 tablets (8 mg total) by mouth 2 (two) times daily. Start the day before Taxotere. Then again the day after chemo for 3 days.  Marland Kitchen gabapentin (NEURONTIN) 300 MG capsule Take 1 capsule (300 mg total) by mouth at bedtime.  . lidocaine-prilocaine (EMLA) cream Apply  to affected area once  . LORazepam (ATIVAN) 0.5 MG tablet Take 1 tablet (0.5 mg total) by mouth at bedtime as needed (Nausea or vomiting).  . magic mouthwash SOLN  5 to 10 ml swish, gargle and spit QID prn mouth soreness.  Marland Kitchen omeprazole (PRILOSEC) 40 MG capsule Take 1 capsule (40 mg total) by mouth daily.  . prochlorperazine (COMPAZINE) 10 MG tablet Take 1 tablet (10 mg total) by mouth every 6 (six) hours as needed (Nausea or vomiting).   No facility-administered encounter medications on file as of 03/22/2019.     Allergies: No Known Allergies  Family History: Family History  Problem Relation Age of Onset  . Ovarian cancer Sister 30  . Dementia Mother   . Aneurysm Father     Social History: Social History   Socioeconomic History  . Marital status: Married    Spouse name: Not on file  . Number of children: Not on file  . Years of education: Not on file  . Highest education level: Not on file  Occupational History  . Not on file  Social Needs  . Financial resource strain: Not on file  . Food insecurity    Worry: Not on file    Inability: Not on file  . Transportation needs    Medical: Not on file    Non-medical: Not on file  Tobacco Use  . Smoking status: Never Smoker  . Smokeless tobacco: Never Used  Substance and Sexual Activity  . Alcohol use: No    Alcohol/week: 0.0 standard drinks  . Drug use: No  . Sexual activity: Not Currently  Lifestyle  . Physical activity    Days per week: Not on file    Minutes per session: Not on file  . Stress: Not on file  Relationships  . Social Herbalist on phone: Not on file    Gets together: Not on file    Attends religious service: Not on file    Active member of club or organization: Not on file    Attends meetings of clubs or organizations: Not on file    Relationship status: Not on file  . Intimate partner violence    Fear of current or ex partner: Not on file    Emotionally abused: Not on file    Physically abused: Not on file    Forced sexual activity: Not on file  Other Topics Concern  . Not on file  Social History Narrative  . Not on file     Observations/Objective:   Height 5\' 4"  (1.626 m), weight 178 lb (80.7 kg). No acute distress.  Alert and oriented.  Speech fluent and not dysarthric.  Language intact.  Eyes orthophoric on primary gaze.  Face symmetric.  Assessment and Plan:   1.  Cervical myofascial pain.  Unrelated to Chiari malformation.  Does not appear to be radicular. 2.  Chiari 1 malformation, incidental finding  She may continue to treat with Tylenol or warm compresses.  If it does not resolve, she was instructed to contact me and I an refer her to physical therapy.  Any issues, she may follow up with me.  Follow Up Instructions:    -I discussed the assessment and treatment plan with the patient. The patient was provided an opportunity to ask questions and all were answered. The patient agreed with the plan and demonstrated an understanding of the instructions.   The patient was advised to call back or seek an in-person evaluation if  the symptoms worsen or if the condition fails to improve as anticipated.   Dudley Major, DO

## 2019-03-22 ENCOUNTER — Other Ambulatory Visit: Payer: Self-pay

## 2019-03-22 ENCOUNTER — Encounter: Payer: Self-pay | Admitting: Neurology

## 2019-03-22 ENCOUNTER — Telehealth (INDEPENDENT_AMBULATORY_CARE_PROVIDER_SITE_OTHER): Payer: BC Managed Care – PPO | Admitting: Neurology

## 2019-03-22 VITALS — Ht 64.0 in | Wt 178.0 lb

## 2019-03-22 DIAGNOSIS — M542 Cervicalgia: Secondary | ICD-10-CM

## 2019-03-22 DIAGNOSIS — G935 Compression of brain: Secondary | ICD-10-CM

## 2019-03-23 ENCOUNTER — Other Ambulatory Visit: Payer: Self-pay | Admitting: *Deleted

## 2019-03-23 DIAGNOSIS — C50411 Malignant neoplasm of upper-outer quadrant of right female breast: Secondary | ICD-10-CM

## 2019-03-23 DIAGNOSIS — Z7409 Other reduced mobility: Secondary | ICD-10-CM

## 2019-03-23 DIAGNOSIS — Z170 Estrogen receptor positive status [ER+]: Secondary | ICD-10-CM

## 2019-03-23 DIAGNOSIS — M25562 Pain in left knee: Secondary | ICD-10-CM

## 2019-03-23 DIAGNOSIS — M25561 Pain in right knee: Secondary | ICD-10-CM

## 2019-03-23 MED ORDER — METHYLPREDNISOLONE 4 MG PO TBPK: ORAL_TABLET | ORAL | 0 refills | Status: DC

## 2019-03-23 NOTE — Telephone Encounter (Signed)
Per call today - Alice [name] states she has minimal improvement in aching in legs and arms.  She is asking about obtained PT or other advice.  This RN discussed above as well as MD recommendation for a medrol dose pack.  Discussed medication.  Prescription sent to the pharmacy.  Referral will be made to PT for evaluation and therapy.

## 2019-03-30 ENCOUNTER — Telehealth: Payer: Self-pay

## 2019-03-30 ENCOUNTER — Other Ambulatory Visit: Payer: Self-pay

## 2019-03-30 DIAGNOSIS — Z170 Estrogen receptor positive status [ER+]: Secondary | ICD-10-CM

## 2019-03-30 DIAGNOSIS — C50411 Malignant neoplasm of upper-outer quadrant of right female breast: Secondary | ICD-10-CM

## 2019-03-30 NOTE — Telephone Encounter (Signed)
Pt called to report some minimal swelling to lower legs, and lower back pain that started on Saturday.    Pt denies any shortness of breath or chest pain.  No past medical history of cardiac concerns.   Pt completed chemotherapy on 10/28.  RN educated that residual side effects can continue after completion of chemotherapy.    RN encouraged patient to use compression socks, elevate legs throughout day, and warm soaks to bilateral lower extremities. RN encouraged use of Tylenol for lower back pain.  Denies any dysuria.  RN also encouraged increase in fluid intake.  Pt voiced understanding and agreement.  Will follow up with MD as scheduled next week.

## 2019-04-02 ENCOUNTER — Other Ambulatory Visit: Payer: Self-pay | Admitting: Obstetrics and Gynecology

## 2019-04-02 DIAGNOSIS — N841 Polyp of cervix uteri: Secondary | ICD-10-CM | POA: Diagnosis not present

## 2019-04-02 DIAGNOSIS — N840 Polyp of corpus uteri: Secondary | ICD-10-CM | POA: Diagnosis not present

## 2019-04-05 NOTE — Progress Notes (Signed)
[place]  [phone] Fax:(336) [phone]    ID: Alice [name] DOB: 28-Mar-1961  MR#: [id number]  VZD#:[id number]  Patient Care Team: [name], MD as PCP - General (Family Medicine) Mauro Kaufmann, RN as Oncology Nurse Navigator Rockwell Germany, RN as Oncology Nurse Navigator Arthur Aydelotte, Virgie Dad, MD as Consulting Physician (Oncology) Kyung Rudd, MD as Consulting Physician (Radiation Oncology) Rolm Bookbinder, MD as Consulting Physician (General Surgery) Earnstine Regal, PA-C as Consulting Physician (Obstetrics and Gynecology) Juanita Craver, MD as Consulting Physician (Gastroenterology) [name], MD as Consulting Physician (Family Medicine) Cristine Polio, MD as Consulting Physician (Plastic Surgery) Chauncey Cruel, MD OTHER MD:  CHIEF COMPLAINT: Estrogen receptor positive breast cancer (s/p right mastectomy)  CURRENT TREATMENT: Anastrozole   INTERVAL HISTORY: Chandrika was seen today for follow up of her estrogen receptor positive breast cancer.  At her last visit, she reported diffuse left breast tenderness. She was sent for left diagnostic mammogram on 03/17/2019, which showed: breast density category B; no mammographic evidence for malignancy.  She also underwent removal of an endocervical polyp on 04/02/2019 by her OBGYN, Dr. Mancel Bale. Pathology from the procedure ([id number]) was benign.  She is due to discuss antiestrogens further.  Her most recent bone density screening, performed on 12/10/2018, showed a T-score of -3.8, which is considered osteoporotic.   REVIEW OF SYSTEMS: [name] is improving but "I am not back to normal yet".  She is getting back to work 04/19/2019.  She walks in her house up and down the stairs but not outside.  She is doing arm exercises to make sure she keeps her range of motion.  Her leg swelling is down and she stopped using the Restasis eyedrops because the tearing resolved.  Her hair has not  started to come back.  She and her husband are keeping appropriate pandemic precautions.  A detailed review of systems today was otherwise stable.   HISTORY OF CURRENT ILLNESS: From the original intake note:  Alice [name] has a history of remote right breast cancer in 1997, treated with right lumpectomy and radiation in [place].  The patient did not receive chemotherapy or antiestrogen treatments.  More recently she presented for her annual mammogram with a non-tender area of firmness in the right breast at 9 o'clock for 2 months. She underwent bilateral diagnostic mammography with tomography and right breast ultrasonography at [facility] on 09/08/2018 showing: breast density category A; scattered-appearing fibroglandular tissue at the lumpectomy site in the upper outer right breast middle depth spanning 3.4 cm, with indistinct margins, was not seen on prior mammogram from 2018.  There was also a keloid scar in the inferior medial left breast.  On physical exam there was a rectangular area of hardness measuring up to 7 cm in the right breast at 9:00, corresponding with the prior lumpectomy and consistent with radiation change.  Ultrasound of this area showed only postlumpectomy change, with no suspicious or discrete mass.  Ultrasound of the axilla was benign  Accordingly on 09/23/2018 she proceeded to biopsy of the right breast area in question. The pathology from this procedure SAA [id number]/[id number]/2000 showed: invasive ductal carcinoma, E-cadherin positive,  grade 3,  Prognostic indicators significant for: estrogen receptor at 100% positive and progesterone receptor, 30% positive, both with strong staining intensity proliferation marker Ki67 at 70%. HER2 negative by immunohistochemistry (0).  The patient's subsequent history is as detailed below.   PAST MEDICAL HISTORY: Past Medical History:  Diagnosis Date   Anemia    Breast cancer (  [place])    right in 1997   Family history of lung cancer      Family history of ovarian cancer    GERD (gastroesophageal reflux disease) 06/30/2018   Herpes simplex type 2 infection 02/2006   History of breast cancer 1997   History of esophagogastroduodenoscopy (EGD) 09/15/2015   small hiatal hernia was noted otherwise all was normal   Personal history of breast cancer     PAST SURGICAL HISTORY: Past Surgical History:  Procedure Laterality Date   BREAST ENHANCEMENT SURGERY     s/p cancer and surgery, right   BREAST LUMPECTOMY     right due to breast cancer   MASTECTOMY W/ SENTINEL NODE BIOPSY Right 10/22/2018   Procedure: RIGHT MASTECTOMY WITH RIGHT AXILLARY SENTINEL LYMPH NODE BIOPSY;  Surgeon: Rolm Bookbinder, MD;  Location: [place];  Service: General;  Laterality: Right;  wisdom teeth removal   FAMILY HISTORY: Family History  Problem Relation Age of Onset   Ovarian cancer Sister 16   Dementia Mother    Aneurysm Father    Patient's father was 57 years old when he died from aneurysm. Patient's mother died from dementia at age 52. Her sister was diagnosed with ovarian cancer at age 54, and this subsequently took her life.  In total of the patient has 5 brothers and 2 sisters.  She is not aware of other cancers in the family   GYNECOLOGIC HISTORY:  No LMP recorded. Patient is postmenopausal. Menarche: 58 years old [name] 0 LMP age 69 Contraceptive none HRT none  Hysterectomy? no BSO? 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 She is married. Husband Edmonia Caprio is from [place], so his native language is [place]. She lives at home with her husband. They have no children. She attends a [employer] in [place].    ADVANCED DIRECTIVES: In the absence of documents to the contrary her husband is automatically her HCPOA.   HEALTH MAINTENANCE: Social History   Tobacco Use   Smoking status: Never Smoker   Smokeless tobacco: Never  Used  Substance Use Topics   Alcohol use: No    Alcohol/week: 0.0 standard drinks   Drug use: No     Colonoscopy: endoscopy in 2017, colonoscopy due 2020 but delayed due to virus  PAP: 12/03/2017, normal  Bone density: 11/2018, -3.8   No Known Allergies  Current Outpatient Medications  Medication Sig Dispense Refill   cycloSPORINE (RESTASIS) 0.05 % ophthalmic emulsion Place 1 drop into both eyes 2 (two) times daily. 0.4 mL 1   dexamethasone (DECADRON) 4 MG tablet Take 2 tablets (8 mg total) by mouth 2 (two) times daily. Start the day before Taxotere. Then again the day after chemo for 3 days. 30 tablet 1   methylPREDNISolone (MEDROL DOSEPAK) 4 MG TBPK tablet Take as directed 1 each 0   omeprazole (PRILOSEC) 40 MG capsule Take 1 capsule (40 mg total) by mouth daily. 30 capsule 5   prochlorperazine (COMPAZINE) 10 MG tablet Take 1 tablet (10 mg total) by mouth every 6 (six) hours as needed (Nausea or vomiting). 30 tablet 1   No current facility-administered medications for this visit.     OBJECTIVE: Middle-aged African-American woman who appears stated age  58:   04/06/19 1331  BP: 108/79  Pulse: 93  Temp: 97.9 F (36.6 C)  Resp: 18  Height: 5' 4"  (1.626 m)  Weight: 173 lb 3.2 oz (78.6  kg)  SpO2: 98%  TempSrc: Temporal  BMI (Calculated): 29.72    Filed Weights   04/06/19 1331  Weight: 173 lb 3.2 oz (78.6 kg)   ECOG: 1  Sclerae unicteric, EOMs intact Wearing a mask No cervical or supraclavicular adenopathy Lungs no rales or rhonchi Heart regular rate and rhythm Abd soft, nontender, positive bowel sounds MSK no focal spinal tenderness, no upper extremity lymphedema Neuro: nonfocal, well oriented, appropriate affect Breasts: The right breast is status post mastectomy.  There is no evidence of chest wall recurrence.  Both axillae are benign   LAB RESULTS:  CMP     Component Value Date/Time   NA 140 03/11/2019 1225   K 3.9 03/11/2019 1225   CL 108  03/11/2019 1225   CO2 24 03/11/2019 1225   GLUCOSE 89 03/11/2019 1225   BUN 4 (L) 03/11/2019 1225   CREATININE 0.57 03/11/2019 1225   CREATININE 0.62 02/10/2019 1229   CALCIUM 8.3 (L) 03/11/2019 1225   PROT 5.8 (L) 03/11/2019 1225   ALBUMIN 2.9 (L) 03/11/2019 1225   AST 22 03/11/2019 1225   AST 49 (H) 02/10/2019 1229   ALT 7 03/11/2019 1225   ALT 23 02/10/2019 1229   ALKPHOS 103 03/11/2019 1225   BILITOT 0.4 03/11/2019 1225   BILITOT 0.4 02/10/2019 1229   GFRNONAA >60 03/11/2019 1225   GFRNONAA >60 02/10/2019 1229   GFRAA >60 03/11/2019 1225   GFRAA >60 02/10/2019 1229    No results found for: TOTALPROTELP, ALBUMINELP, A1GS, A2GS, BETS, BETA2SER, GAMS, MSPIKE, SPEI  No results found for: KPAFRELGTCHN, LAMBDASER, KAPLAMBRATIO  Lab Results  Component Value Date   WBC 4.3 04/06/2019   NEUTROABS 3.0 04/06/2019   HGB 9.9 (L) 04/06/2019   HCT 30.7 (L) 04/06/2019   MCV 106.6 (H) 04/06/2019   PLT 328 04/06/2019   No results found for: LABCA2  No components found for: LSLHTD428  No results for input(s): INR in the last 168 hours.  No results found for: LABCA2  No results found for: JGO115  No results found for: BWI203  No results found for: TDH741  No results found for: CA2729  No components found for: HGQUANT  No results found for: CEA1 / No results found for: CEA1  No results found for: AFPTUMOR  No results found for: CHROMOGRNA  No results found for: PSA1  Appointment on 04/06/2019  Component Date Value Ref Range Status   WBC 04/06/2019 4.3  4.0 - 10.5 K/uL Final   RBC 04/06/2019 2.88* 3.87 - 5.11 MIL/uL Final   Hemoglobin 04/06/2019 9.9* 12.0 - 15.0 g/dL Final   HCT 04/06/2019 30.7* 36.0 - 46.0 % Final   MCV 04/06/2019 106.6* 80.0 - 100.0 fL Final   MCH 04/06/2019 34.4* 26.0 - 34.0 pg Final   MCHC 04/06/2019 32.2  30.0 - 36.0 g/dL Final   RDW 04/06/2019 18.0* 11.5 - 15.5 % Final   Platelets 04/06/2019 328  150 - 400 K/uL Final   nRBC  04/06/2019 0.0  0.0 - 0.2 % Final   Neutrophils Relative % 04/06/2019 68  % Final   Neutro Abs 04/06/2019 3.0  1.7 - 7.7 K/uL Final   Lymphocytes Relative 04/06/2019 20  % Final   Lymphs Abs 04/06/2019 0.8  0.7 - 4.0 K/uL Final   Monocytes Relative 04/06/2019 9  % Final   Monocytes Absolute 04/06/2019 0.4  0.1 - 1.0 K/uL Final   Eosinophils Relative 04/06/2019 2  % Final   Eosinophils Absolute 04/06/2019 0.1  0.0 -  0.5 K/uL Final   Basophils Relative 04/06/2019 1  % Final   Basophils Absolute 04/06/2019 0.0  0.0 - 0.1 K/uL Final   Immature Granulocytes 04/06/2019 0  % Final   Abs Immature Granulocytes 04/06/2019 0.01  0.00 - 0.07 K/uL Final   Performed at [facility], [address] [place], [place] [address]    (this displays the last labs from the last 3 days)  No results found for: TOTALPROTELP, ALBUMINELP, A1GS, A2GS, BETS, BETA2SER, GAMS, MSPIKE, SPEI (this displays SPEP labs)  No results found for: KPAFRELGTCHN, LAMBDASER, KAPLAMBRATIO (kappa/lambda light chains)  No results found for: HGBA, HGBA2QUANT, HGBFQUANT, HGBSQUAN (Hemoglobinopathy evaluation)   No results found for: LDH  No results found for: IRON, TIBC, IRONPCTSAT (Iron and TIBC)  No results found for: FERRITIN  Urinalysis    Component Value Date/Time   COLORURINE YELLOW 12/30/2018 0948   APPEARANCEUR CLEAR 12/30/2018 0948   LABSPEC 1.004 (L) 12/30/2018 0948   PHURINE 8.0 12/30/2018 0948   GLUCOSEU NEGATIVE 12/30/2018 0948   HGBUR NEGATIVE 12/30/2018 0948   BILIRUBINUR NEGATIVE 12/30/2018 0948   KETONESUR NEGATIVE 12/30/2018 0948   PROTEINUR NEGATIVE 12/30/2018 0948   NITRITE NEGATIVE 12/30/2018 0948   LEUKOCYTESUR NEGATIVE 12/30/2018 0948    STUDIES: Mm Diag Breast Tomo Uni Left  Result Date: 03/17/2019 CLINICAL DATA:  Patient presents for diffuse left breast tenderness. EXAM: DIGITAL DIAGNOSTIC UNILATERAL LEFT MAMMOGRAM WITH CAD AND TOMO COMPARISON:   Previous exam(s). ACR Breast Density Category b: There are scattered areas of fibroglandular density. FINDINGS: No concerning masses, calcifications or nonsurgical distortion identified within the left breast. Mammographic images were processed with CAD. IMPRESSION: No mammographic evidence for malignancy. RECOMMENDATION: Continued clinical evaluation for left breast pain. Screening mammogram left breast in 1 year. I have discussed the findings and recommendations with the patient. If applicable, a reminder letter will be sent to the patient regarding the next appointment. BI-RADS CATEGORY  2: Benign. Electronically Signed   By: Lovey Newcomer M.D.   On: 03/17/2019 09:30      ELIGIBLE FOR AVAILABLE RESEARCH PROTOCOL: no  ASSESSMENT: 58 y.o. [place] woman   (1) status post right lumpectomy in 1997,  (a) status post adjuvant radiation  (b) did not receive antiestrogens or chemotherapy  (2) status post right breast upper outer quadrant biopsy 09/23/2018 for a clinical T2 N0, stage IIA invasive ductal carcinoma, grade 3, estrogen and progesterone receptor positive, HER-2 not amplified, with an MIB-1 of 70%  (3) Oncotype obtained from the initial biopsy showed a score of 26 predicting a risk of recurrence outside the breast of 16/21% (depending on nodal status), if the patient's only systemic therapy is an antiestrogen for 5 years.  It also predicts a significant benefit from chemotherapy  (4) status post right mastectomy and sentinel lymph node sampling 10/22/2018 for a pT2 pN0, stage IB invasive ductal carcinoma, grade 2, with negative margins  (a) not planning reconstruction at this point  (5) adjuvant chemotherapy recommended on 11/05/2018 by Dr. Jana Hakim  (a) second opinion with Dr. Annabell Sabal at [facility] on 11/18/2018 recommended 4 cycles of adjuvant docetaxel and cyclophosphamide   (b) Third opinion with Dr. Adan Sis on 11/24/2018 recommended 4 cycles of adjuvant Docetaxel and Cyclophosphamide  (c)  started adjuvant chemotherapy with docetaxel and cyclophosphamide on 12/23/2018, completed 4 cycles 02/24/2019  (6) anastrozole started 09/30/2018 (patient stopped due to concerns over bone loss--to restart once adjuvant therapy has completed)  (a) Bone density on 12/10/2018 shows a T score  of -3.8 in the AP spine: Osteoporosis  (b) vitamin D level 03/11/2019 less than 10  (7) genetics testing 10/06/2018 through the [facility] Common Hereditary Cancers Panel found no deleterious mutations in APC, ATM, AXIN2, BARD1, BMPR1A, BRCA1, BRCA2, BRIP1, CDH1, CDKN2A (p14ARF), CDKN2A (p16INK4a), CKD4, CHEK2, CTNNA1, DICER1, EPCAM (Deletion/duplication testing only), GREM1 (promoter region deletion/duplication testing only), KIT, MEN1, MLH1, MSH2, MSH3, MSH6, MUTYH, NBN, NF1, NHTL1, PALB2, PDGFRA, PMS2, POLD1, POLE, PTEN, RAD50, RAD51C, RAD51D, SDHB, SDHC, SDHD, SMAD4, SMARCA4. STK11, TP53, TSC1, TSC2, and VHL. The following genes were evaluated for sequence changes only: SDHA and HOXB13 c.251G>A variant only.   (a) 2 VUS's identified: VUS in APC c.-34014C>T and VHL c.340+272T>G identified   PLAN: I spent approximately 30 minutes face to face with Tyshay with more than 50% of that time spent in counseling and coordination of care.  She understands she has completed local treatment for her breast cancer and has completed her chemotherapy.  She now [name] to start antiestrogens.  She wanted to know why she still needed antiestrogen since she had chemo and we went over the reasons for that in detail.  We then discussed tamoxifen versus anastrozole.  Tamoxifen has the advantage that it strengthens the bones but she is absolutely against it because of the possibility, though remote, of endometrial carcinoma.  Accordingly she is restarting anastrozole.  She has a good understanding of the possible toxicities side effects and complications of this agent.  She already has osteoporosis.  She was instructed to start vitamin D  daily and that together with the anastrozole prescriptions were placed in today.  I also asked her to start counting her steps and to try to get at least 5000 steps a day.  She would like a dermatology referral for the lesion on the upper portion of her buttock.  I have requested that through Dr. Amy Martinique.  She would like to discuss osteoporosis with a specialist.  I really do not know anyone in town who specializes in that although Dr. Crist Infante has made a special study of this and may be willing to see her as a consult.  Today I gave her the names of the main medicines that we use for osteoporosis namely alendronate, ibandronate, zoledronate and denosumab.  I encouraged her to read up on the.  She will return to see me in March.  She knows to call for any other issue that may develop before that visit.   Virgie Dad. Allisha Harter, MD  04/06/2019 1:42 PM Medical Oncology and Hematology [facility] [place], [place] [address] Tel. [phone]    Fax. [phone]   I, Wilburn Mylar, am acting as scribe for Dr. Virgie Dad. Fleetwood Pierron.  I, Lurline Del MD, have reviewed the above documentation for accuracy and completeness, and I agree with the above.

## 2019-04-06 ENCOUNTER — Inpatient Hospital Stay: Payer: BC Managed Care – PPO

## 2019-04-06 ENCOUNTER — Encounter: Payer: Self-pay | Admitting: Oncology

## 2019-04-06 ENCOUNTER — Other Ambulatory Visit: Payer: Self-pay

## 2019-04-06 ENCOUNTER — Inpatient Hospital Stay: Payer: BC Managed Care – PPO | Attending: Oncology | Admitting: Oncology

## 2019-04-06 VITALS — BP 108/79 | HR 93 | Temp 97.9°F | Resp 18 | Ht 64.0 in | Wt 173.2 lb

## 2019-04-06 DIAGNOSIS — C50411 Malignant neoplasm of upper-outer quadrant of right female breast: Secondary | ICD-10-CM

## 2019-04-06 DIAGNOSIS — Z79899 Other long term (current) drug therapy: Secondary | ICD-10-CM | POA: Diagnosis not present

## 2019-04-06 DIAGNOSIS — M818 Other osteoporosis without current pathological fracture: Secondary | ICD-10-CM | POA: Insufficient documentation

## 2019-04-06 DIAGNOSIS — Z923 Personal history of irradiation: Secondary | ICD-10-CM | POA: Insufficient documentation

## 2019-04-06 DIAGNOSIS — C50811 Malignant neoplasm of overlapping sites of right female breast: Secondary | ICD-10-CM | POA: Diagnosis not present

## 2019-04-06 DIAGNOSIS — Z170 Estrogen receptor positive status [ER+]: Secondary | ICD-10-CM

## 2019-04-06 DIAGNOSIS — Z79811 Long term (current) use of aromatase inhibitors: Secondary | ICD-10-CM | POA: Diagnosis not present

## 2019-04-06 DIAGNOSIS — Z9011 Acquired absence of right breast and nipple: Secondary | ICD-10-CM | POA: Diagnosis not present

## 2019-04-06 DIAGNOSIS — Z9221 Personal history of antineoplastic chemotherapy: Secondary | ICD-10-CM | POA: Insufficient documentation

## 2019-04-06 LAB — CBC WITH DIFFERENTIAL/PLATELET
Abs Immature Granulocytes: 0.01 10*3/uL (ref 0.00–0.07)
Basophils Absolute: 0 10*3/uL (ref 0.0–0.1)
Basophils Relative: 1 %
Eosinophils Absolute: 0.1 10*3/uL (ref 0.0–0.5)
Eosinophils Relative: 2 %
HCT: 30.7 % — ABNORMAL LOW (ref 36.0–46.0)
Hemoglobin: 9.9 g/dL — ABNORMAL LOW (ref 12.0–15.0)
Immature Granulocytes: 0 %
Lymphocytes Relative: 20 %
Lymphs Abs: 0.8 10*3/uL (ref 0.7–4.0)
MCH: 34.4 pg — ABNORMAL HIGH (ref 26.0–34.0)
MCHC: 32.2 g/dL (ref 30.0–36.0)
MCV: 106.6 fL — ABNORMAL HIGH (ref 80.0–100.0)
Monocytes Absolute: 0.4 10*3/uL (ref 0.1–1.0)
Monocytes Relative: 9 %
Neutro Abs: 3 10*3/uL (ref 1.7–7.7)
Neutrophils Relative %: 68 %
Platelets: 328 10*3/uL (ref 150–400)
RBC: 2.88 MIL/uL — ABNORMAL LOW (ref 3.87–5.11)
RDW: 18 % — ABNORMAL HIGH (ref 11.5–15.5)
WBC: 4.3 10*3/uL (ref 4.0–10.5)
nRBC: 0 % (ref 0.0–0.2)

## 2019-04-06 LAB — COMPREHENSIVE METABOLIC PANEL
ALT: 7 U/L (ref 0–44)
AST: 25 U/L (ref 15–41)
Albumin: 3.1 g/dL — ABNORMAL LOW (ref 3.5–5.0)
Alkaline Phosphatase: 81 U/L (ref 38–126)
Anion gap: 9 (ref 5–15)
BUN: <4 mg/dL — ABNORMAL LOW (ref 6–20)
CO2: 25 mmol/L (ref 22–32)
Calcium: 8.6 mg/dL — ABNORMAL LOW (ref 8.9–10.3)
Chloride: 107 mmol/L (ref 98–111)
Creatinine, Ser: 0.59 mg/dL (ref 0.44–1.00)
GFR calc Af Amer: >60 mL/min (ref >60)
GFR calc non Af Amer: >60 mL/min (ref >60)
Glucose, Bld: 96 mg/dL (ref 70–99)
Potassium: 4.3 mmol/L (ref 3.5–5.1)
Sodium: 141 mmol/L (ref 135–145)
Total Bilirubin: 0.5 mg/dL (ref 0.3–1.2)
Total Protein: 5.8 g/dL — ABNORMAL LOW (ref 6.5–8.1)

## 2019-04-06 MED ORDER — ANASTROZOLE 1 MG PO TABS: 1.0000 mg | ORAL_TABLET | Freq: Every day | ORAL | 4 refills | Status: DC

## 2019-04-06 MED ORDER — VITAMIN D 25 MCG (1000 UNIT) PO TABS: 1000.0000 [IU] | ORAL_TABLET | Freq: Every day | ORAL | 4 refills | Status: DC

## 2019-04-07 ENCOUNTER — Telehealth: Payer: Self-pay

## 2019-04-07 ENCOUNTER — Telehealth: Payer: Self-pay | Admitting: Oncology

## 2019-04-07 NOTE — Telephone Encounter (Signed)
I talk with patient regarding schedule  

## 2019-04-07 NOTE — Telephone Encounter (Signed)
Pt called to inquire about referrals placed on 12/8.  Pt wants to hold off on anastrozole until she is able to have consult with Dr. Joylene Draft regarding osteoporosis per MD recommendations.  RN informed patient referrals are placed however may take a few business days for them to review and schedule.  Pt voiced understanding.    RN faxed referral to Dr. Silvestre Mesi office at [phone].

## 2019-04-12 ENCOUNTER — Ambulatory Visit: Payer: BC Managed Care – PPO | Attending: General Surgery

## 2019-04-13 ENCOUNTER — Telehealth: Payer: Self-pay | Admitting: *Deleted

## 2019-04-13 MED ORDER — ALENDRONATE SODIUM 70 MG PO TABS: 70.0000 mg | ORAL_TABLET | ORAL | 3 refills | Status: DC

## 2019-04-13 NOTE — Telephone Encounter (Signed)
Thank you for an excellent note on pt K-K  GM

## 2019-04-13 NOTE — Telephone Encounter (Signed)
This RN spoke with Alice [name] per her call wanting to follow up post MD visit about referrals and noted low back pain.  Per low back pain - Alice [name] states she noticed pain approximately 2 weeks ago - worse in am and improves with walking.  Per visit- she was to be referred to an osteoporosis specialist- though there is not a known specialist- Dr Joylene Draft has done particular studies with osteoporosis.  She was also to be referred to Dr Amy Martinique for dermatological assessment of lesion on her upper buttock.  This RN called Dr Perini's at [facility]- and was informed he was not taking new patients.  This RN then called to Dr Warren Lacy Doug Sou and obtained an appointment for 06/04/2019 with provider- Dr Fontaine No at [facility].  This RN called pt back and discussed above.  Issues of post chemo recovery discussed and need for pt to slowly increase her activity for benefit. This RN also discussed osteoporosis with recommendation for pt to start on calcium and vitamin d3 as well as institute weight bearing exercise with goal for 30 minutes 3x a week .  Alice [name] states she has bought a treadmill and it should be arriving this week.  She would prefer to obtain her calcium from her diet - discussed with patient able to verbalize foods rich in calcium.  She will also start vitamin D3 and post discussion of medications that help with bone regrowth in osteoporosis - Alice [name] would like to do the alendronate.  Alice [name] will institute the above and call in 2 weeks with update or sooner if she has other concerns.  This RN sent prescription for alendronate.

## 2019-04-16 DIAGNOSIS — H04123 Dry eye syndrome of bilateral lacrimal glands: Secondary | ICD-10-CM | POA: Diagnosis not present

## 2019-04-16 DIAGNOSIS — H5213 Myopia, bilateral: Secondary | ICD-10-CM | POA: Diagnosis not present

## 2019-04-20 DIAGNOSIS — M545 Low back pain: Secondary | ICD-10-CM | POA: Diagnosis not present

## 2019-04-27 ENCOUNTER — Other Ambulatory Visit: Payer: Self-pay | Admitting: Oncology

## 2019-04-29 ENCOUNTER — Encounter: Payer: Self-pay | Admitting: *Deleted

## 2019-05-03 DIAGNOSIS — M7652 Patellar tendinitis, left knee: Secondary | ICD-10-CM | POA: Diagnosis not present

## 2019-05-03 DIAGNOSIS — M1712 Unilateral primary osteoarthritis, left knee: Secondary | ICD-10-CM | POA: Diagnosis not present

## 2019-05-06 ENCOUNTER — Telehealth: Payer: Self-pay | Admitting: *Deleted

## 2019-05-06 DIAGNOSIS — M818 Other osteoporosis without current pathological fracture: Secondary | ICD-10-CM

## 2019-05-13 DIAGNOSIS — K5904 Chronic idiopathic constipation: Secondary | ICD-10-CM | POA: Diagnosis not present

## 2019-05-13 DIAGNOSIS — K219 Gastro-esophageal reflux disease without esophagitis: Secondary | ICD-10-CM | POA: Diagnosis not present

## 2019-05-14 DIAGNOSIS — N898 Other specified noninflammatory disorders of vagina: Secondary | ICD-10-CM | POA: Diagnosis not present

## 2019-05-17 ENCOUNTER — Other Ambulatory Visit: Payer: Self-pay

## 2019-05-17 ENCOUNTER — Telehealth: Payer: Self-pay | Admitting: Rheumatology

## 2019-05-17 ENCOUNTER — Encounter: Payer: Self-pay | Admitting: Rheumatology

## 2019-05-17 ENCOUNTER — Telehealth (INDEPENDENT_AMBULATORY_CARE_PROVIDER_SITE_OTHER): Payer: BC Managed Care – PPO | Admitting: Rheumatology

## 2019-05-17 DIAGNOSIS — C50411 Malignant neoplasm of upper-outer quadrant of right female breast: Secondary | ICD-10-CM | POA: Diagnosis not present

## 2019-05-17 DIAGNOSIS — Z8041 Family history of malignant neoplasm of ovary: Secondary | ICD-10-CM

## 2019-05-17 DIAGNOSIS — Z170 Estrogen receptor positive status [ER+]: Secondary | ICD-10-CM

## 2019-05-17 DIAGNOSIS — M818 Other osteoporosis without current pathological fracture: Secondary | ICD-10-CM

## 2019-05-17 DIAGNOSIS — M545 Low back pain, unspecified: Secondary | ICD-10-CM

## 2019-05-17 DIAGNOSIS — E559 Vitamin D deficiency, unspecified: Secondary | ICD-10-CM

## 2019-05-17 DIAGNOSIS — Z801 Family history of malignant neoplasm of trachea, bronchus and lung: Secondary | ICD-10-CM

## 2019-05-17 MED ORDER — VITAMIN D (ERGOCALCIFEROL) 1.25 MG (50000 UNIT) PO CAPS: 50000.0000 [IU] | ORAL_CAPSULE | ORAL | 0 refills | Status: DC

## 2019-05-17 NOTE — Telephone Encounter (Signed)
Patient called checking on her prescription of Vitamin D that Dr. Estanislado Pandy was sending to [facility] [place]. [employer].  Patient states Dr. Estanislado Pandy told her the prescription would be sent to the pharmacy this morning and hasn't received a call that it is ready to be picked up.  Patient is requesting a return call to let her know when the prescription is ready for her to pick up.

## 2019-05-17 NOTE — Patient Instructions (Signed)
Vitamin D 50, 000 units by mouth twice weekly for 3 months   Citracal D is over the counter.  Recommend 1200 mg of calcium daily (between diet and supplement)     Denosumab injection What is this medicine? DENOSUMAB (den oh sue mab) slows bone breakdown. Prolia is used to treat osteoporosis in women after menopause and in men, and in people who are taking corticosteroids for 6 months or more. Delton See is used to treat a high calcium level due to cancer and to prevent bone fractures and other bone problems caused by multiple myeloma or cancer bone metastases. Delton See is also used to treat giant cell tumor of the bone. This medicine may be used for other purposes; ask your health care provider or pharmacist if you have questions. COMMON BRAND NAME(S): Prolia, XGEVA What should I tell my health care provider before I take this medicine? They need to know if you have any of these conditions:  dental disease  having surgery or tooth extraction  infection  kidney disease  low levels of calcium or Vitamin D in the blood  malnutrition  on hemodialysis  skin conditions or sensitivity  thyroid or parathyroid disease  an unusual reaction to denosumab, other medicines, foods, dyes, or preservatives  pregnant or trying to get pregnant  breast-feeding How should I use this medicine? This medicine is for injection under the skin. It is given by a health care professional in a hospital or clinic setting. A special MedGuide will be given to you before each treatment. Be sure to read this information carefully each time. For Prolia, talk to your pediatrician regarding the use of this medicine in children. Special care may be needed. For Delton See, talk to your pediatrician regarding the use of this medicine in children. While this drug may be prescribed for children as young as 13 years for selected conditions, precautions do apply. Overdosage: If you think you have taken too much of this medicine  contact a poison control center or emergency room at once. NOTE: This medicine is only for you. Do not share this medicine with others. What if I miss a dose? It is important not to miss your dose. Call your doctor or health care professional if you are unable to keep an appointment. What may interact with this medicine? Do not take this medicine with any of the following medications:  other medicines containing denosumab This medicine may also interact with the following medications:  medicines that lower your chance of fighting infection  steroid medicines like prednisone or cortisone This list may not describe all possible interactions. Give your health care provider a list of all the medicines, herbs, non-prescription drugs, or dietary supplements you use. Also tell them if you smoke, drink alcohol, or use illegal drugs. Some items may interact with your medicine. What should I watch for while using this medicine? Visit your doctor or health care professional for regular checks on your progress. Your doctor or health care professional may order blood tests and other tests to see how you are doing. Call your doctor or health care professional for advice if you get a fever, chills or sore throat, or other symptoms of a cold or flu. Do not treat yourself. This drug may decrease your body's ability to fight infection. Try to avoid being around people who are sick. You should make sure you get enough calcium and vitamin D while you are taking this medicine, unless your doctor tells you not to. Discuss the foods  you eat and the vitamins you take with your health care professional. See your dentist regularly. Brush and floss your teeth as directed. Before you have any dental work done, tell your dentist you are receiving this medicine. Do not become pregnant while taking this medicine or for 5 months after stopping it. Talk with your doctor or health care professional about your birth control options  while taking this medicine. Women should inform their doctor if they wish to become pregnant or think they might be pregnant. There is a potential for serious side effects to an unborn child. Talk to your health care professional or pharmacist for more information. What side effects may I notice from receiving this medicine? Side effects that you should report to your doctor or health care professional as soon as possible:  allergic reactions like skin rash, itching or hives, swelling of the face, lips, or tongue  bone pain  breathing problems  dizziness  jaw pain, especially after dental work  redness, blistering, peeling of the skin  signs and symptoms of infection like fever or chills; cough; sore throat; pain or trouble passing urine  signs of low calcium like fast heartbeat, muscle cramps or muscle pain; pain, tingling, numbness in the hands or feet; seizures  unusual bleeding or bruising  unusually weak or tired Side effects that usually do not require medical attention (report to your doctor or health care professional if they continue or are bothersome):  constipation  diarrhea  headache  joint pain  loss of appetite  muscle pain  runny nose  tiredness  upset stomach This list may not describe all possible side effects. Call your doctor for medical advice about side effects. You may report side effects to FDA at 1-800-FDA-1088. Where should I keep my medicine? This medicine is only given in a clinic, doctor's office, or other health care setting and will not be stored at home. NOTE: This sheet is a summary. It may not cover all possible information. If you have questions about this medicine, talk to your doctor, pharmacist, or health care provider.  2020 [employer]vier/Gold Standard (2017-08-22 16:10:44)

## 2019-05-17 NOTE — Telephone Encounter (Signed)
Per office visit today Vitamin D deficiency- Patient has severe vitamin D deficiency.  I will obtain labs.  She has been taking vitamin D 1000 units a day which will not be sufficient.  Her ideal dose will be vitamin D 50,000 units twice a week for 3 months and then we will readjust dose depending on the higher levels.  Prescription sent to the pharmacy and patient advised

## 2019-05-17 NOTE — Progress Notes (Signed)
Virtual Visit via Video Note  I connected with Alice [name] on 05/17/19 at  9:00 AM EST by a video enabled telemedicine application and verified that I am speaking with the correct person using two identifiers.  Location: Patient: Home  Provider: Clinic  This service was conducted via virtual visit.  Both audio and visual tools were used.  The patient was located at home. I was located in my office.  Consent was obtained prior to the virtual visit and is aware of possible charges through their insurance for this visit.  The patient is an established patient.  Dr. Estanislado Pandy, MD conducted the virtual visit and Hazel Sams, PA-C acted as scribe during the service.  Office staff helped with scheduling follow up visits after the service was conducted.     I discussed the limitations of evaluation and management by telemedicine and the availability of in person appointments. The patient expressed understanding and agreed to proceed.  CC: History of Present Illness: Patient is a 59 year old female with a past medical history of breast cancer.  She has been seen in consultation per request of Dr. Jana Hakim for osteoporosis.  Her most recent bone density showed T score of -3.8.  She was also found to have vitamin D deficiency.  She has been taking vitamin D 1000 units a day.  She states she has been a vegetarian for many years and has not been drinking milk.  She finished chemotherapy in October.  She states she recently bought a treadmill and is planning to start exercising.  There is no history of steroid use.  She denies any history of taking antiepileptics or thyroid medications.  She is taken antacids intermittently.  There is no family history of osteoporosis.  There is no history of fracture.  Review of Systems  Constitutional: Negative for fever and malaise/fatigue.  HENT: Negative for congestion.   Eyes: Negative for photophobia, pain, discharge and redness.  Respiratory: Negative for  cough, shortness of breath and wheezing.   Cardiovascular: Negative for chest pain, palpitations and leg swelling.  Gastrointestinal: Negative for blood in stool, constipation and diarrhea.  Genitourinary: Negative for dysuria and urgency.  Musculoskeletal: Positive for joint pain. Negative for back pain, myalgias and neck pain.  Skin: Negative for rash.  Neurological: Negative for dizziness, weakness and headaches.  Endo/Heme/Allergies: Does not bruise/bleed easily.  Psychiatric/Behavioral: Negative for depression and memory loss. The patient is not nervous/anxious and does not have insomnia.       Observations/Objective: Physical Exam  Constitutional: She is oriented to person, place, and time and well-developed, well-nourished, and in no distress.  HENT:  Head: Normocephalic and atraumatic.  Eyes: Conjunctivae are normal.  Pulmonary/Chest: Effort normal.  Neurological: She is alert and oriented to person, place, and time.  Psychiatric: Mood, memory, affect and judgment normal.    Patient reports morning stiffness for 0 NONE.   Patient denies nocturnal pain.  Difficulty dressing/grooming: Denies Difficulty climbing stairs: Denies Difficulty getting out of chair: Denies Difficulty using hands for taps, buttons, cutlery, and/or writing: Denies   Assessment and Plan: Diagnoses and all orders for this visit:  Other osteoporosis without current pathological fracture- Detailed counseling guarding osteoporosis was provided.  Different treatment options and their side effects were discussed at length.  With her history of breast cancer I would be hesitant to put her on anabolic agents.  She also has history of reflux and I would avoid bisphosphonates.  The association of IV Reclast with ONJ was discussed.  She was hesitant to go with IV Reclast.  I discussed the option of subcu Prolia.  Indications side effects contraindications were discussed.  I believe that would be the ideal choice  for her.  Association with atypical fractures and ONJ was also discussed.  Her calcium and vitamin D has to be normal before she can take any treatment.  Need for regular exercise, resistive exercise, calcium intake of total between dietary and supplement 1200 mg, vitamin D was discussed.  Patient states that she does not like taking any medications.  I discussed the risk of acquiring fractures in the future.  She would like to read about Prolia.  I will mail her a handout on Prolia.  I have also advised her to come in for getting following labs.  Patient stated that she will come in tomorrow.   Comments: DEXA 12/10/18: AP spine BMD 0.733 with T-score -3.8. Calcium 8.6 on 04/06/19.  Vitamin D 6.43 on 03/11/19. Orders: -     PTH -     VITAMIN D -     Calcium -     Phosphorus -     Serum protein electrophoresis with reflex -     TSH -     Tissue transglutaminase, IgA -     Gliadin antibodies, serum  Vitamin D deficiency- Patient has severe vitamin D deficiency.  I will obtain labs.  She has been taking vitamin D 1000 units a day which will not be sufficient.  Her ideal dose will be vitamin D 50,000 units twice a week for 3 months and then we will readjust dose depending on the higher levels. Comments: 03/11/19: Vitamin D 6.43. She was started on vitamin D 1,000 units daily. Orders: -     VITAMIN D  Malignant neoplasm of upper-outer quadrant of right breast in female, estrogen receptor positive ([place])- Patient has been followed by Dr. Jana Hakim.  Family history of ovarian cancer  Family history of lung cancer  Acute midline low back pain without sciatica    Follow Up Instructions: She will follow up in 3 month   I discussed the assessment and treatment plan with the patient. The patient was provided an opportunity to ask questions and all were answered. The patient agreed with the plan and demonstrated an understanding of the instructions.   The patient was advised to call back or seek  an in-person evaluation if the symptoms worsen or if the condition fails to improve as anticipated.  I provided 45 minutes of non-face-to-face time during this encounter.   Bo Merino, MD

## 2019-05-19 NOTE — Telephone Encounter (Signed)
No entry 

## 2019-05-20 ENCOUNTER — Telehealth: Payer: Self-pay | Admitting: Rheumatology

## 2019-05-20 NOTE — Telephone Encounter (Signed)
Patient states she ordered some "bone density medication" Algaecal. Patient states it claims to increase bone density naturally. Patient would like to know if you had heard of it and if she can take the Vitamin D with it. Please advise.

## 2019-05-20 NOTE — Telephone Encounter (Signed)
Dr. Estanislado Pandy is ok with the patient taking this supplement if she would like to.  Her vitamin D level is very low.  Please advise her to continue taking vitamin D 50,000 units by mouth twice weekly.  Algaecal has 1600 units of vitamin D as well.  We will recheck vitamin D in 3 months.

## 2019-05-20 NOTE — Telephone Encounter (Signed)
Patient left a voicemail stating "she has questions concerning some medications" and requesting a return call.

## 2019-05-21 NOTE — Telephone Encounter (Signed)
Patient advised Dr. Estanislado Pandy is ok with her taking this supplement if she would like to.  Patient advised her vitamin D level is very low.  Patient advised  to continue taking vitamin D 50,000 units by mouth twice weekly.  Algaecal has 1600 units of vitamin D as well.  We will recheck vitamin D in 3 months.  Patient verbalized understanding.

## 2019-06-04 DIAGNOSIS — D235 Other benign neoplasm of skin of trunk: Secondary | ICD-10-CM | POA: Diagnosis not present

## 2019-06-11 DIAGNOSIS — M7652 Patellar tendinitis, left knee: Secondary | ICD-10-CM | POA: Diagnosis not present

## 2019-06-29 DIAGNOSIS — R1011 Right upper quadrant pain: Secondary | ICD-10-CM | POA: Diagnosis not present

## 2019-06-29 DIAGNOSIS — K5909 Other constipation: Secondary | ICD-10-CM | POA: Diagnosis not present

## 2019-07-01 DIAGNOSIS — D1771 Benign lipomatous neoplasm of kidney: Secondary | ICD-10-CM | POA: Diagnosis not present

## 2019-07-05 ENCOUNTER — Telehealth: Payer: Self-pay | Admitting: Oncology

## 2019-07-05 NOTE — Telephone Encounter (Signed)
R/s appt per 3/8 sch msg - pt aware of changes

## 2019-07-13 ENCOUNTER — Ambulatory Visit: Payer: BC Managed Care – PPO | Admitting: Oncology

## 2019-07-13 ENCOUNTER — Other Ambulatory Visit: Payer: BC Managed Care – PPO

## 2019-07-15 NOTE — Progress Notes (Signed)
Alice [name]  [phone] [phone]    ID: Alice [name] DOB: May 17, 1960  MR#: [id number]  IOX#:[id number]  Patient Care Team: [name], MD as PCP - General (Family Medicine) Mauro Kaufmann, RN as Oncology Nurse Navigator Rockwell Germany, RN as Oncology Nurse Navigator Abbye Lao, Virgie Dad, MD as Consulting Physician (Oncology) Kyung Rudd, MD as Consulting Physician (Radiation Oncology) Rolm Bookbinder, MD as Consulting Physician (General Surgery) Earnstine Regal, PA-C as Consulting Physician (Obstetrics and Gynecology) Juanita Craver, MD as Consulting Physician (Gastroenterology) [name], MD as Consulting Physician (Family Medicine) Cristine Polio, MD as Consulting Physician (Plastic Surgery) Chauncey Cruel, MD OTHER MD:  CHIEF COMPLAINT: Estrogen receptor positive breast cancer (s/p right mastectomy)  CURRENT TREATMENT: Anastrozole   INTERVAL HISTORY: Alice [name] was seen today for follow up of her estrogen receptor positive breast cancer.  She restarted anastrozole at her last visit on 04/06/2019.  She tolerates that generally well, but is concerned because of the osteoporosis problem.  Her most recent bone density screening, performed on 12/10/2018, showed a T-score of -3.8, which is considered osteoporotic.   REVIEW OF SYSTEMS: Alice [name] is back to work full-time.  She is now eating a fully plant-based diet and tells me she is trying to stay "alkaline".  She says her husband follows a different diet.  She has lost quite a bit of weight but wants to get down to about 150pounds.  She uses her treadmill for 30 to 35 minutes about 3 times a week.  A detailed review of systems today was otherwise stable   HISTORY OF CURRENT ILLNESS: From the original intake note:  Alice [name] has a history of remote right breast cancer in 1997, treated with right lumpectomy and radiation in [facility].  The patient did not  receive chemotherapy or antiestrogen treatments.  More recently she presented for her annual mammogram with a non-tender area of firmness in the right breast at 9 o'clock for 2 months. She underwent bilateral diagnostic mammography with tomography and right breast ultrasonography at [facility] on 09/08/2018 showing: breast density category A; scattered-appearing fibroglandular tissue at the lumpectomy site in the upper outer right breast middle depth spanning 3.4 cm, with indistinct margins, was not seen on prior mammogram from 2018.  There was also a keloid scar in the inferior medial left breast.  On physical exam there was a rectangular area of hardness measuring up to 7 cm in the right breast at 9:00, corresponding with the prior lumpectomy and consistent with radiation change.  Ultrasound of this area showed only postlumpectomy change, with no suspicious or discrete mass.  Ultrasound of the axilla was benign  Accordingly on 09/23/2018 she proceeded to biopsy of the right breast area in question. The pathology from this procedure [facility] 20-07/02/2000 showed: invasive ductal carcinoma, E-cadherin positive,  grade 3,  Prognostic indicators significant for: estrogen receptor at 100% positive and progesterone receptor, 30% positive, both with strong staining intensity proliferation marker Ki67 at 70%. HER2 negative by immunohistochemistry (0).  The patient's subsequent history is as detailed below.   PAST MEDICAL HISTORY: Past Medical History:  Diagnosis Date  . Anemia   . Breast cancer (Alice [name])    right in 1997  . Family history of lung cancer   . Family history of ovarian cancer   . GERD (gastroesophageal reflux disease) 06/30/2018  . Herpes simplex type 2 infection 02/2006  . History of breast cancer 1997  . History of esophagogastroduodenoscopy (EGD) 09/15/2015   small hiatal hernia  was noted otherwise all was normal  . Personal history of breast cancer     PAST SURGICAL HISTORY: Past Surgical  History:  Procedure Laterality Date  . BREAST ENHANCEMENT SURGERY     s/p cancer and surgery, right  . BREAST LUMPECTOMY     right due to breast cancer  . MASTECTOMY W/ SENTINEL NODE BIOPSY Right 10/22/2018   Procedure: RIGHT MASTECTOMY WITH RIGHT AXILLARY SENTINEL LYMPH NODE BIOPSY;  Surgeon: Rolm Bookbinder, MD;  Location: [facility];  Service: General;  Laterality: Right;  wisdom teeth removal   FAMILY HISTORY: Family History  Problem Relation Age of Onset  . Ovarian cancer Sister 13  . Dementia Mother   . Aneurysm Father    Patient's father was 83 years old when he died from aneurysm. Patient's mother died from dementia at age 31. Her sister was diagnosed with ovarian cancer at age 72, and this subsequently took her life.  In total of the patient has 5 brothers and 2 sisters.  She is not aware of other cancers in the family   GYNECOLOGIC HISTORY:  No LMP recorded. Patient is postmenopausal. Menarche: 59 years old Alice [name] P 0 LMP age 30 Contraceptive none HRT none  Hysterectomy? no BSO? 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 She is married. Husband Edmonia Caprio is from [place], so his native language is [place]. She lives at home with her husband. They have no children. She attends a [employer] in [place].    ADVANCED DIRECTIVES: In the absence of documents to the contrary her husband is automatically her HCPOA.   HEALTH MAINTENANCE: Social History   Tobacco Use  . Smoking status: Never Smoker  . Smokeless tobacco: Never Used  Substance Use Topics  . Alcohol use: No    Alcohol/week: 0.0 standard drinks  . Drug use: No     Colonoscopy: endoscopy in 2017, colonoscopy due 2020 but delayed due to virus  PAP: 12/03/2017, normal  Bone density: 11/2018, -3.8   No Known Allergies  Current Outpatient Medications  Medication Sig Dispense Refill  . anastrozole (ARIMIDEX) 1 MG tablet  Take 1 tablet (1 mg total) by mouth daily. 90 tablet 4  . cholecalciferol (VITAMIN D3) 25 MCG (1000 UT) tablet Take 1 tablet (1,000 Units total) by mouth daily. 100 tablet 4  . oxyCODONE-acetaminophen (PERCOCET/ROXICET) 5-325 MG tablet as needed.    . polyethylene glycol powder (GLYCOLAX/MIRALAX) 17 GM/SCOOP powder as needed.    . RESTASIS 0.05 % ophthalmic emulsion INSTILL 1 DROP IN BOTH EYES TWICE DAILY 60 each 1  . Vitamin D, Ergocalciferol, (DRISDOL) 1.25 MG (50000 UNIT) CAPS capsule Take 1 capsule (50,000 Units total) by mouth 2 (two) times a week. 24 capsule 0   No current facility-administered medications for this visit.    OBJECTIVE: African-American woman in no acute distress  Vitals:   07/16/19 1102  BP: 126/85  Pulse: 83  Temp: 98 F (36.7 C)  Resp: 18  Height: '5\' 4"'$  (1.626 m)  Weight: 162 lb 1.6 oz (73.5 kg)  SpO2: 100%  TempSrc: Temporal  BMI (Calculated): 27.81    Filed Weights   07/16/19 1102  Weight: 162 lb 1.6 oz (73.5 kg)   ECOG: 1  Sclerae unicteric, EOMs intact Wearing a mask No cervical or supraclavicular adenopathy Lungs no rales or rhonchi Heart regular rate and rhythm Abd soft, nontender, positive bowel sounds MSK no focal spinal tenderness, no  upper extremity lymphedema Neuro: nonfocal, well oriented, positive affect Breasts: Status post right mastectomy with no evidence of chest wall recurrence.  The left breast is unremarkable, status post reduction mammoplasty.  Both axillae are benign.   LAB RESULTS:  CMP     Component Value Date/Time   NA 140 07/16/2019 1042   K 4.3 07/16/2019 1042   CL 106 07/16/2019 1042   CO2 27 07/16/2019 1042   GLUCOSE 86 07/16/2019 1042   BUN 6 07/16/2019 1042   CREATININE 0.71 07/16/2019 1042   CREATININE 0.62 02/10/2019 1229   CALCIUM 9.2 07/16/2019 1042   PROT 7.2 07/16/2019 1042   ALBUMIN 3.5 07/16/2019 1042   AST 19 07/16/2019 1042   AST 49 (H) 02/10/2019 1229   ALT 9 07/16/2019 1042   ALT 23  02/10/2019 1229   ALKPHOS 121 07/16/2019 1042   BILITOT 0.4 07/16/2019 1042   BILITOT 0.4 02/10/2019 1229   GFRNONAA >60 07/16/2019 1042   GFRNONAA >60 02/10/2019 1229   GFRAA >60 07/16/2019 1042   GFRAA >60 02/10/2019 1229    No results found for: TOTALPROTELP, ALBUMINELP, A1GS, A2GS, BETS, BETA2SER, GAMS, MSPIKE, SPEI  No results found for: KPAFRELGTCHN, LAMBDASER, KAPLAMBRATIO  Lab Results  Component Value Date   WBC 5.4 07/16/2019   NEUTROABS 3.3 07/16/2019   HGB 12.1 07/16/2019   HCT 36.2 07/16/2019   MCV 104.0 (H) 07/16/2019   PLT 284 07/16/2019   No results found for: LABCA2  No components found for: OINOMV672  No results for input(s): INR in the last 168 hours.  No results found for: LABCA2  No results found for: CNO709  No results found for: GGE366  No results found for: QHU765  No results found for: CA2729  No components found for: HGQUANT  No results found for: CEA1 / No results found for: CEA1  No results found for: AFPTUMOR  No results found for: CHROMOGRNA  No results found for: HGBA, HGBA2QUANT, HGBFQUANT, HGBSQUAN (Hemoglobinopathy evaluation)   No results found for: LDH  No results found for: IRON, TIBC, IRONPCTSAT (Iron and TIBC)  No results found for: FERRITIN  Urinalysis    Component Value Date/Time   COLORURINE YELLOW 12/30/2018 0948   APPEARANCEUR CLEAR 12/30/2018 0948   LABSPEC 1.004 (L) 12/30/2018 0948   PHURINE 8.0 12/30/2018 0948   GLUCOSEU NEGATIVE 12/30/2018 0948   HGBUR NEGATIVE 12/30/2018 0948   BILIRUBINUR NEGATIVE 12/30/2018 0948   KETONESUR NEGATIVE 12/30/2018 0948   PROTEINUR NEGATIVE 12/30/2018 0948   NITRITE NEGATIVE 12/30/2018 0948   LEUKOCYTESUR NEGATIVE 12/30/2018 0948    STUDIES: Bone density and lab work results discussed   ELIGIBLE FOR AVAILABLE RESEARCH PROTOCOL: no  ASSESSMENT: 59 y.o. [place] woman   (1) status post right lumpectomy in 1997,  (a) status post adjuvant radiation  (b) did  not receive antiestrogens or chemotherapy  (2) status post right breast upper outer quadrant biopsy 09/23/2018 for a clinical T2 N0, stage IIA invasive ductal carcinoma, grade 3, estrogen and progesterone receptor positive, HER-2 not amplified, with an MIB-1 of 70%  (3) Oncotype obtained from the initial biopsy showed a score of 26 predicting a risk of recurrence outside the breast of 16/21% (depending on nodal status), if the patient's only systemic therapy is an antiestrogen for 5 years.  It also predicts a significant benefit from chemotherapy  (4) status post right mastectomy and sentinel lymph node sampling 10/22/2018 for a pT2 pN0, stage IB invasive ductal carcinoma, grade 2, with negative margins  (a)  not planning reconstruction at this point  (5) adjuvant chemotherapy recommended on 11/05/2018 by Dr. Jana Hakim  (a) second opinion with Dr. Annabell Sabal at [facility] on 11/18/2018 recommended 4 cycles of adjuvant docetaxel and cyclophosphamide   (b) Third opinion with Dr. Adan Sis on 11/24/2018 recommended 4 cycles of adjuvant Docetaxel and Cyclophosphamide  (c) started adjuvant chemotherapy with docetaxel and cyclophosphamide on 12/23/2018, completed 4 cycles 02/24/2019  (6) anastrozole started 09/30/2018 (patient stopped due to concerns over bone loss--to restart once adjuvant therapy has completed)  (a) Bone density on 12/10/2018 shows a T score of -3.8 in the AP spine: Osteoporosis  (b) vitamin D level 03/11/2019 less than 10  (7) genetics testing 10/06/2018 through the [facility] Common Hereditary Cancers Panel found no deleterious mutations in APC, ATM, AXIN2, BARD1, BMPR1A, BRCA1, BRCA2, BRIP1, CDH1, CDKN2A (p14ARF), CDKN2A (p16INK4a), CKD4, CHEK2, CTNNA1, DICER1, EPCAM (Deletion/duplication testing only), GREM1 (promoter region deletion/duplication testing only), KIT, MEN1, MLH1, MSH2, MSH3, MSH6, MUTYH, NBN, NF1, NHTL1, PALB2, PDGFRA, PMS2, POLD1, POLE, PTEN, RAD50, RAD51C, RAD51D, SDHB, SDHC, SDHD,  SMAD4, SMARCA4. STK11, TP53, TSC1, TSC2, and VHL. The following genes were evaluated for sequence changes only: SDHA and HOXB13 c.251G>A variant only.   (a) 2 VUS's identified: VUS in APC c.-34014C>T and VHL c.340+272T>G identified    PLAN: Diane will soon be 1 year out from definitive surgery for her breast cancer.  There is no evidence of disease recurrence.  This is very favorable.  She is tolerating anastrozole well and the plan will be to continue that a minimum of 5 years.  She is appropriately concerned about osteoporosis.  She has purchased a weight jacket which was suggested by Dr. Haynes Kerns when she saw him.  She also got some 5 pound weights that she will be carrying.  She also got herself some strontium and some additional vitamin D with other vitamins to do what she can to improve her bone density.  Today we discussed denosumab/Prolia in detail.  She understands the possibility of temporary hypocalcemia, which we usually treat by taking Tums on the day of the shot; some bone aches for a day or 2 usually only with the first dose; and the rare possibility of osteonecrosis of the jaw which is more of a concern if she has teeth extracted or implanted.  She has a bridge in place but that would not affect that risk.  She is going to explore the possibility of Prolia and if she decides to do it she will let us know and I will be glad to write that order for her.  I did write her for additional bras and prostheses.  She will have her next mammogram in November and she will see me the first week in December  She knows to call for any other issue that may develop before then.  Total encounter time 30 minutes.Sarajane Jews C. Arieon Corcoran, MD  07/16/2019 11:28 AM Medical Oncology and Hematology [facility] [place], [place] [address] Tel. [phone]    Fax. [phone]   I, Wilburn Mylar, am acting as scribe for Dr. Virgie Dad. Val Schiavo.  I, Lurline Del  MD, have reviewed the above documentation for accuracy and completeness, and I agree with the above.   *Total Encounter Time as defined by the Centers for Medicare and Medicaid Services includes, in addition to the face-to-face time of a patient visit (documented in the note above) non-face-to-face time: obtaining and reviewing outside history, ordering and reviewing medications, tests  or procedures, care coordination (communications with other health care professionals or caregivers) and documentation in the medical record.

## 2019-07-16 ENCOUNTER — Inpatient Hospital Stay (HOSPITAL_BASED_OUTPATIENT_CLINIC_OR_DEPARTMENT_OTHER): Payer: BC Managed Care – PPO | Admitting: Oncology

## 2019-07-16 ENCOUNTER — Other Ambulatory Visit: Payer: Self-pay

## 2019-07-16 ENCOUNTER — Inpatient Hospital Stay: Payer: BC Managed Care – PPO | Attending: Oncology

## 2019-07-16 VITALS — BP 126/85 | HR 83 | Temp 98.0°F | Resp 18 | Ht 64.0 in | Wt 162.1 lb

## 2019-07-16 DIAGNOSIS — M818 Other osteoporosis without current pathological fracture: Secondary | ICD-10-CM | POA: Insufficient documentation

## 2019-07-16 DIAGNOSIS — Z170 Estrogen receptor positive status [ER+]: Secondary | ICD-10-CM

## 2019-07-16 DIAGNOSIS — C50411 Malignant neoplasm of upper-outer quadrant of right female breast: Secondary | ICD-10-CM | POA: Diagnosis not present

## 2019-07-16 DIAGNOSIS — Z79811 Long term (current) use of aromatase inhibitors: Secondary | ICD-10-CM | POA: Insufficient documentation

## 2019-07-16 DIAGNOSIS — Z9011 Acquired absence of right breast and nipple: Secondary | ICD-10-CM | POA: Insufficient documentation

## 2019-07-16 DIAGNOSIS — Z923 Personal history of irradiation: Secondary | ICD-10-CM | POA: Diagnosis not present

## 2019-07-16 DIAGNOSIS — C50811 Malignant neoplasm of overlapping sites of right female breast: Secondary | ICD-10-CM | POA: Insufficient documentation

## 2019-07-16 LAB — COMPREHENSIVE METABOLIC PANEL
ALT: 9 U/L (ref 0–44)
AST: 19 U/L (ref 15–41)
Albumin: 3.5 g/dL (ref 3.5–5.0)
Alkaline Phosphatase: 121 U/L (ref 38–126)
Anion gap: 7 (ref 5–15)
BUN: 6 mg/dL (ref 6–20)
CO2: 27 mmol/L (ref 22–32)
Calcium: 9.2 mg/dL (ref 8.9–10.3)
Chloride: 106 mmol/L (ref 98–111)
Creatinine, Ser: 0.71 mg/dL (ref 0.44–1.00)
GFR calc Af Amer: >60 mL/min (ref >60)
GFR calc non Af Amer: >60 mL/min (ref >60)
Glucose, Bld: 86 mg/dL (ref 70–99)
Potassium: 4.3 mmol/L (ref 3.5–5.1)
Sodium: 140 mmol/L (ref 135–145)
Total Bilirubin: 0.4 mg/dL (ref 0.3–1.2)
Total Protein: 7.2 g/dL (ref 6.5–8.1)

## 2019-07-16 LAB — CBC WITH DIFFERENTIAL/PLATELET
Abs Immature Granulocytes: 0.02 10*3/uL (ref 0.00–0.07)
Basophils Absolute: 0 10*3/uL (ref 0.0–0.1)
Basophils Relative: 1 %
Eosinophils Absolute: 0.1 10*3/uL (ref 0.0–0.5)
Eosinophils Relative: 2 %
HCT: 36.2 % (ref 36.0–46.0)
Hemoglobin: 12.1 g/dL (ref 12.0–15.0)
Immature Granulocytes: 0 %
Lymphocytes Relative: 26 %
Lymphs Abs: 1.4 10*3/uL (ref 0.7–4.0)
MCH: 34.8 pg — ABNORMAL HIGH (ref 26.0–34.0)
MCHC: 33.4 g/dL (ref 30.0–36.0)
MCV: 104 fL — ABNORMAL HIGH (ref 80.0–100.0)
Monocytes Absolute: 0.5 10*3/uL (ref 0.1–1.0)
Monocytes Relative: 10 %
Neutro Abs: 3.3 10*3/uL (ref 1.7–7.7)
Neutrophils Relative %: 61 %
Platelets: 284 10*3/uL (ref 150–400)
RBC: 3.48 MIL/uL — ABNORMAL LOW (ref 3.87–5.11)
RDW: 13.6 % (ref 11.5–15.5)
WBC: 5.4 10*3/uL (ref 4.0–10.5)
nRBC: 0 % (ref 0.0–0.2)

## 2019-07-19 ENCOUNTER — Telehealth: Payer: Self-pay | Admitting: Oncology

## 2019-07-19 NOTE — Telephone Encounter (Signed)
Scheduled appt per 3/19 los. Pt confirmed new appt date and time.

## 2019-07-29 DIAGNOSIS — M545 Low back pain: Secondary | ICD-10-CM | POA: Diagnosis not present

## 2019-07-29 DIAGNOSIS — E559 Vitamin D deficiency, unspecified: Secondary | ICD-10-CM | POA: Diagnosis not present

## 2019-08-02 ENCOUNTER — Other Ambulatory Visit: Payer: Self-pay | Admitting: Family Medicine

## 2019-08-02 ENCOUNTER — Ambulatory Visit
Admission: RE | Admit: 2019-08-02 | Discharge: 2019-08-02 | Disposition: A | Discharge status: Home / Self Care | Payer: BC Managed Care – PPO | Source: Ambulatory Visit | Attending: Family Medicine | Admitting: Family Medicine

## 2019-08-02 DIAGNOSIS — M545 Low back pain, unspecified: Secondary | ICD-10-CM

## 2019-08-05 ENCOUNTER — Telehealth: Payer: Self-pay | Admitting: *Deleted

## 2019-08-05 NOTE — Telephone Encounter (Signed)
Received refill request on Vitamin D for patient. Patient advised we will need to recheck Vitamin D level prior to refill. Patient states she had that performed at [facility] and will send a copy of results.

## 2019-08-17 NOTE — Progress Notes (Signed)
Office Visit Note  Patient: Alice [name]             Date of Birth: [birth year]           MRN: [id number]             PCP: [name], MD Referring: [name], MD Visit Date: 08/24/2019 Occupation: @GUAROCC @  Subjective:  Discuss osteoporosis treatment options   History of Present Illness: Alice [name] is a 59 y.o. female with history of osteoporosis and vitamin D.  Patient presents today to discuss treatment options.  She is interested in starting on Prolia subcu injections every 6 months.  According to the patient she had recent lab work at her PCPs office and her vitamin D returned within normal limits.  She is not currently taking a vitamin D supplement.  She denies any recent falls or fractures.  She states that she has been having intermittent lower back pain and was evaluated by her PCP and had x-rays at that visit.  According to the patient her PCP recommended physical therapy and home exercises.  She has not set up her physical therapy appointments yet.  She denies any symptoms of radiculopathy at this time.   Activities of Daily Living:  Patient reports morning stiffness for 3 minutes.   Patient Denies nocturnal pain.  Difficulty dressing/grooming: Denies Difficulty climbing stairs: Denies Difficulty getting out of chair: Denies Difficulty using hands for taps, buttons, cutlery, and/or writing: Denies  Review of Systems  Constitutional: Negative for fatigue.  HENT: Negative for mouth sores, mouth dryness and nose dryness.   Eyes: Negative for pain, visual disturbance and dryness.  Respiratory: Negative for cough, hemoptysis, shortness of breath and difficulty breathing.   Cardiovascular: Negative for chest pain, palpitations, hypertension and swelling in legs/feet.  Gastrointestinal: Positive for constipation. Negative for blood in stool and diarrhea.  Endocrine: Positive for heat intolerance. Negative for increased urination.  Genitourinary:  Negative for difficulty urinating and painful urination.  Musculoskeletal: Positive for arthralgias, joint pain and morning stiffness. Negative for joint swelling, myalgias, muscle weakness, muscle tenderness and myalgias.  Skin: Negative for color change, pallor, rash, hair loss, nodules/bumps, skin tightness, ulcers and sensitivity to sunlight.  Allergic/Immunologic: Negative for susceptible to infections.  Neurological: Negative for dizziness, numbness, headaches and weakness.  Hematological: Negative for bruising/bleeding tendency and swollen glands.  Psychiatric/Behavioral: Negative for depressed mood and sleep disturbance. The patient is not nervous/anxious.     PMFS History:  Patient Active Problem List   Diagnosis Date Noted  . Acute midline low back pain without sciatica 01/15/2019  . Genetic testing 10/06/2018  . Family history of ovarian cancer   . Family history of lung cancer   . Personal history of breast cancer   . Malignant neoplasm of upper-outer quadrant of right breast in female, estrogen receptor positive ([place]) 09/29/2018    Past Medical History:  Diagnosis Date  . Anemia   . Breast cancer ([place])    right in 1997  . Family history of lung cancer   . Family history of ovarian cancer   . GERD (gastroesophageal reflux disease) 06/30/2018  . Herpes simplex type 2 infection 02/2006  . History of breast cancer 1997  . History of esophagogastroduodenoscopy (EGD) 09/15/2015   small hiatal hernia was noted otherwise all was normal  . Osteoporosis   . Personal history of breast cancer     Family History  Problem Relation Age of Onset  . Ovarian cancer Sister 29  . Dementia  Mother   . Aneurysm Father    Past Surgical History:  Procedure Laterality Date  . BREAST ENHANCEMENT SURGERY     s/p cancer and surgery, right  . BREAST LUMPECTOMY     right due to breast cancer  . MASTECTOMY W/ SENTINEL NODE BIOPSY Right 10/22/2018   Procedure: RIGHT MASTECTOMY WITH RIGHT  AXILLARY SENTINEL LYMPH NODE BIOPSY;  Surgeon: Rolm Bookbinder, MD;  Location: [facility];  Service: General;  Laterality: Right;   Social History   Social History Narrative   Married lives with spouse   No children   Some college   Right handed   Does drink tea 1-2 a week green tea, no soda, no coffee    There is no immunization history on file for this patient.   Objective: Vital Signs: BP 98/68 (BP Location: Left Arm, Patient Position: Sitting, Cuff Size: Normal)   Pulse 74   Resp 16   Ht 5\' 4"  (1.626 m)   Wt 161 lb 9.6 oz (73.3 kg)   BMI 27.74 kg/m    Physical Exam Vitals and nursing note reviewed.  Constitutional:      Appearance: She is well-developed.  HENT:     Head: Normocephalic and atraumatic.  Eyes:     Conjunctiva/sclera: Conjunctivae normal.  Pulmonary:     Effort: Pulmonary effort is normal.  Abdominal:     General: Bowel sounds are normal.     Palpations: Abdomen is soft.  Musculoskeletal:     Cervical back: Normal range of motion.  Lymphadenopathy:     Cervical: No cervical adenopathy.  Skin:    General: Skin is warm and dry.     Capillary Refill: Capillary refill takes less than 2 seconds.  Neurological:     Mental Status: She is alert and oriented to person, place, and time.  Psychiatric:        Behavior: Behavior normal.      Musculoskeletal Exam: C-spine, thoracic spine, and lumbar spine good ROM.  Shoulder joints, elbow joints, wrist joints, MCPs, PIPs, and DIPs good ROM with no synovitis.  Complete fist formation bilaterally.  Hip joints, knee joints, ankle joints, MTPs, PIPs, and DIPs good ROM with no synovitis.  No warmth or effusion of knee joints.  No tenderness or swelling of ankle joints.   CDAI Exam: CDAI Score: -- Patient Global: --; Provider Global: -- Swollen: --; Tender: -- Joint Exam 08/24/2019   No joint exam has been documented for this visit   There is currently no information documented on the  homunculus. Go to the Rheumatology activity and complete the homunculus joint exam.  Investigation: No additional findings.  Imaging: DG Lumbar Spine 2-3 Views  Result Date: 08/02/2019 CLINICAL DATA:  Low back pain for 2 months without known injury. EXAM: LUMBAR SPINE - 2-3 VIEW COMPARISON:  None. FINDINGS: There is no evidence of lumbar spine fracture. Alignment is normal. Intervertebral disc spaces are maintained. IMPRESSION: Negative. Electronically Signed   By: Marijo Conception M.D.   On: 08/02/2019 16:36    Recent Labs: Lab Results  Component Value Date   WBC 5.4 07/16/2019   HGB 12.1 07/16/2019   PLT 284 07/16/2019   NA 140 07/16/2019   K 4.3 07/16/2019   CL 106 07/16/2019   CO2 27 07/16/2019   GLUCOSE 86 07/16/2019   BUN 6 07/16/2019   CREATININE 0.71 07/16/2019   BILITOT 0.4 07/16/2019   ALKPHOS 121 07/16/2019   AST 19 07/16/2019   ALT  9 07/16/2019   PROT 7.2 07/16/2019   ALBUMIN 3.5 07/16/2019   CALCIUM 9.2 07/16/2019   GFRAA >60 07/16/2019  December 09, 2018 CBC was normal, TSH was normal, July 29, 2019 vitamin D 54.9.  Speciality Comments: No specialty comments available.  Procedures:  No procedures performed Allergies: Patient has no known allergies.    Assessment / Plan:     Visit Diagnoses: Other osteoporosis without current pathological fracture -DEXA 12/10/2018 AP spine BMD 0.733 with T score -3.8.  DEXA results were reviewed today in the office.  The patient is postmenopausal, estrogen deficient, and has a personal history of breast cancer and underwent chemotherapy.  She has not had any recent falls or fractures.  She is nave to osteoporosis treatment.  Different treatment options were discussed.  She is not a good candidate for an anabolic agent due to her history of breast cancer.  She does have history of GERD so we will avoid oral bisphosphonates.  We discussed starting her on Prolia 60 mg subcutaneous injections once every 6 months.  Indications,  contraindications, potential side effects of Prolia were discussed today.  All questions were addressed and she is in agreement to proceed with applying for Prolia.  According to the patient she is not taking vitamin D at this time due to her vitamin D being within normal limits.  We will try to obtain records of her most recent vitamin D level.  We will also obtain the following lab work.  She will follow-up in the office in 6 months.- Plan: Parathyroid hormone, intact (no Ca), Calcium, Phosphorus, TSH  Vitamin D deficiency -vitamin D was 6.43 on 03/11/2019.  According to the patient she had her vitamin D checked with her PCP recently.  We will obtain records from her primary care.  Plan: Vitamin D, Cholecalciferol, 25 MCG (1000 UT) CAPS  Malignant neoplasm of upper-outer quadrant of right breast in female, estrogen receptor positive ([place])  Family history of ovarian cancer  Family history of lung cancer  Orders: Orders Placed This Encounter  Procedures  . Parathyroid hormone, intact (no Ca)  . Calcium  . Phosphorus  . TSH   Meds ordered this encounter  Medications  . Vitamin D, Cholecalciferol, 25 MCG (1000 UT) CAPS    Sig: Take 2 tablets by mouth daily.    Dispense:  60 capsule    Refill:  5    Order Specific Question:   Supervising Provider    Answer:   Bo Merino [phone]    Face-to-face time spent with patient was 30 minutes. Greater than 50% of time was spent in counseling and coordination of care.  Follow-Up Instructions: Return in about 6 months (around 02/23/2020) for Osteoporosis.   Hazel Sams, PA-C  I examined and evaluated the patient with Hazel Sams PA.  We had detailed discussion with the patient regarding different treatment options and their side effects.  She was in agreement to proceed with Prolia.  Will obtain some additional labs..  She will continue to take calcium and vitamin D as prescribed.  Once approved we will bring her in to get Prolia injection.   All of her joints with good range of motion with no synovitis on examination today.  The plan of care was discussed as noted above.  Bo Merino, MD  Note - This record has been created using Editor, commissioning.  Chart creation errors have been sought, but may not always  have been located. Such creation errors do not reflect on  the standard of medical care.

## 2019-08-24 ENCOUNTER — Telehealth: Payer: Self-pay | Admitting: Pharmacist

## 2019-08-24 ENCOUNTER — Other Ambulatory Visit: Payer: Self-pay

## 2019-08-24 ENCOUNTER — Encounter: Payer: Self-pay | Admitting: Physician Assistant

## 2019-08-24 ENCOUNTER — Ambulatory Visit: Payer: BC Managed Care – PPO | Admitting: Rheumatology

## 2019-08-24 VITALS — BP 98/68 | HR 74 | Resp 16 | Ht 64.0 in | Wt 161.6 lb

## 2019-08-24 DIAGNOSIS — M818 Other osteoporosis without current pathological fracture: Secondary | ICD-10-CM

## 2019-08-24 DIAGNOSIS — Z8041 Family history of malignant neoplasm of ovary: Secondary | ICD-10-CM

## 2019-08-24 DIAGNOSIS — Z170 Estrogen receptor positive status [ER+]: Secondary | ICD-10-CM

## 2019-08-24 DIAGNOSIS — C50411 Malignant neoplasm of upper-outer quadrant of right female breast: Secondary | ICD-10-CM | POA: Diagnosis not present

## 2019-08-24 DIAGNOSIS — E559 Vitamin D deficiency, unspecified: Secondary | ICD-10-CM

## 2019-08-24 DIAGNOSIS — Z801 Family history of malignant neoplasm of trachea, bronchus and lung: Secondary | ICD-10-CM

## 2019-08-24 MED ORDER — VITAMIN D (CHOLECALCIFEROL) 25 MCG (1000 UT) PO CAPS: 2.0000 | ORAL_CAPSULE | Freq: Every day | ORAL | 5 refills | Status: DC

## 2019-08-24 NOTE — Telephone Encounter (Signed)
Please start benefits investigation for Prolia for the treatment of osteoporosis. She is naive to treatment.  PCP documented history of GERD and recommended against bisphosphonate.  T-score -3.8.  Mariella Saa, PharmD, [place], [place] Clinical Specialty Pharmacist [phone]  08/24/2019 12:36 PM

## 2019-08-24 NOTE — Progress Notes (Signed)
Pharmacy Note  Subjective:  Patient presents today to [facility] for follow up office visit.   Patient was seen by the pharmacist for counseling on Prolia. She is naive to osteoporosis treatment. Her T-score  Is -3.8.  Objective: CMP     Component Value Date/Time   NA 140 07/16/2019 1042   K 4.3 07/16/2019 1042   CL 106 07/16/2019 1042   CO2 27 07/16/2019 1042   GLUCOSE 86 07/16/2019 1042   BUN 6 07/16/2019 1042   CREATININE 0.71 07/16/2019 1042   CREATININE 0.62 02/10/2019 1229   CALCIUM 9.2 07/16/2019 1042   PROT 7.2 07/16/2019 1042   ALBUMIN 3.5 07/16/2019 1042   AST 19 07/16/2019 1042   AST 49 (H) 02/10/2019 1229   ALT 9 07/16/2019 1042   ALT 23 02/10/2019 1229   ALKPHOS 121 07/16/2019 1042   BILITOT 0.4 07/16/2019 1042   BILITOT 0.4 02/10/2019 1229   GFRNONAA >60 07/16/2019 1042   GFRNONAA >60 02/10/2019 1229   GFRAA >60 07/16/2019 1042   GFRAA >60 02/10/2019 1229    Vitamin D Lab Results  Component Value Date   VD25OH 6.43 (L) 03/11/2019    Assessment/Plan:  Counseled patient on purpose, proper use, and adverse effects of Prolia.  Counseled patient that Prolia is a medication that must be injected every 6 months by a healthcare professional.  Advised patient to take calcium 1200 mg daily and vitamin D 800 units daily.  Reviewed the most common adverse effects of Prolia including risk of infection, osteonecrosis of the jaw, rash, and muscle/bone pain.  Patient confirms she does not have any major dental work planned at this time.  Reviewed with patient the signs/symptoms of low calcium and advised patient to alert [place] if she experiences these symptoms.  Provided patient with medication education material and answered all questions. Will apply for Prolia through patient's insurance.  All questions encouraged and answered. Instructed patient to call with any questions or concerns.  Mariella Saa, PharmD, [place], [place] Clinical Specialty  Pharmacist [phone]  08/24/2019 12:02 PM

## 2019-08-25 DIAGNOSIS — L723 Sebaceous cyst: Secondary | ICD-10-CM | POA: Diagnosis not present

## 2019-08-25 LAB — PARATHYROID HORMONE, INTACT (NO CA): PTH: 93 pg/mL — ABNORMAL HIGH (ref 14–64)

## 2019-08-25 LAB — CALCIUM: Calcium: 9.6 mg/dL (ref 8.6–10.4)

## 2019-08-25 LAB — PHOSPHORUS: Phosphorus: 5 mg/dL — ABNORMAL HIGH (ref 2.5–4.5)

## 2019-08-25 NOTE — Progress Notes (Signed)
Baseline Prolia labs.  Please advise once all labs have resulted if patient is still eligible to proceed with Prolia injections.

## 2019-08-25 NOTE — Telephone Encounter (Signed)
Submitted a Prior Authorization request to [facility] for [place] via Cover My Meds. Will update once we receive a response.  (KeyLuisa DagoIS:[id number]  Ran test claim- PA required. [facility] must fill [facility] if approved.

## 2019-08-26 NOTE — Progress Notes (Signed)
Her calcium level is normal. Her vitamin D was very low. We will try to add vitamin D to the recent blood draw. She cannot have Prolia until we have vitamin D level back.

## 2019-08-26 NOTE — Progress Notes (Signed)
She had vitamin D level done by her PCP in April which was 54.9.  Patient should be able to receive Prolia injection as her calcium and vitamin D levels both are normal.  Patient will need to be on a maintenance therapy of vitamin D.

## 2019-08-27 NOTE — Telephone Encounter (Signed)
Received a fax regarding Prior Authorization from [place] for [facility]. Authorization has been DENIED because plan states they sent request for clinical documentation of pre-treatment T-score and did not receive response.  Faxed chart notes to [facility] Appeals team. Will update when we receive a response.

## 2019-09-03 ENCOUNTER — Telehealth: Payer: Self-pay | Admitting: Rheumatology

## 2019-09-03 NOTE — Telephone Encounter (Signed)
Patient called stating she just received a letter from [facility] dated 08/26/19 stating authorization for Prolia was denied due to not receiving clinical documentation from the doctor.  Patient is requesting a return call to discuss status of appeal.

## 2019-09-08 NOTE — Telephone Encounter (Signed)
Received a fax from  [place] regarding an approval for [place] from 08/09/2019 to 09/07/2020.   Reference number: [id number], PharmD, [place], [place] Clinical Specialty Pharmacist [phone]  09/08/2019 10:20 AM

## 2019-09-08 NOTE — Telephone Encounter (Signed)
Returned patient call.  Advised that we did submit an appeal and received fax today that it was approved.  Advised that next step is to determine which pharmacy she can fill and coordinate shipment to the office.  We will update patient with next steps when we determine which pharmacy she can use.  Patient verbalized understanding.  Mariella Saa, PharmD, [place], [place] Clinical Specialty Pharmacist [phone]  09/08/2019 10:30 AM

## 2019-09-13 NOTE — Telephone Encounter (Signed)
[facility] must [facility]

## 2019-09-21 NOTE — Telephone Encounter (Signed)
Called patient to follow-up about starting Prolia.  At last call she wanted to do some more research prior to starting.  No answer and voicemail box full.  We will attempt at a later time.  Mariella Saa, PharmD, [place], CPP Clinical Specialty Pharmacist (Rheumatology and Pulmonology)  09/21/2019 9:51 AM

## 2019-09-22 ENCOUNTER — Telehealth: Payer: Self-pay | Admitting: Rheumatology

## 2019-09-22 DIAGNOSIS — M818 Other osteoporosis without current pathological fracture: Secondary | ICD-10-CM

## 2019-09-22 NOTE — Telephone Encounter (Signed)
Patient called requesting a return call to discuss Prolia injection.  Patient states she has "a couple of additional questions to ask before scheduling the appointment."

## 2019-09-23 MED ORDER — PROLIA 60 MG/ML ~~LOC~~ SOSY
60.0000 mg | PREFILLED_SYRINGE | SUBCUTANEOUS | 0 refills | Status: DC
Start: 2019-09-23 — End: 2020-03-07

## 2019-09-23 NOTE — Telephone Encounter (Signed)
Patient call.  She is still apprehensive about starting Prolia.  Reviewed risk versus benefits of Prolia.  She inquired about an oral medication.  Reviewed bisphosphonates risk versus benefits.  Patient is not interested in bisphosphonate therapy.  She is still deciding whether or not she wants to start Prolia.  [facility] and she will schedule shipment to the office if she decides to proceed with Prolia.  Patient given phone number to call about co-pay program through [employer] in the event that her co-pay is affordable.  Advised we will need labs 10 to 14 days prior to injection in 10 to 14 days after injection.  Patient will call the office on Tuesday to let us know how she would like to proceed.  Mariella Saa, PharmD, [place], CPP Clinical Specialty Pharmacist (Rheumatology and Pulmonology)  09/23/2019 9:37 AM

## 2019-10-01 DIAGNOSIS — C50911 Malignant neoplasm of unspecified site of right female breast: Secondary | ICD-10-CM | POA: Diagnosis not present

## 2019-10-01 DIAGNOSIS — L723 Sebaceous cyst: Secondary | ICD-10-CM | POA: Diagnosis not present

## 2019-10-04 ENCOUNTER — Other Ambulatory Visit: Payer: Self-pay

## 2019-10-04 ENCOUNTER — Telehealth: Payer: Self-pay

## 2019-10-04 MED ORDER — ANASTROZOLE 1 MG PO TABS: 1.0000 mg | ORAL_TABLET | Freq: Every day | ORAL | 4 refills | Status: DC

## 2019-10-04 NOTE — Telephone Encounter (Signed)
Pt called and LVM asking about 6 mo mammo. Returned pt's call and pt asked if Dr Jana Hakim would want her to have a 6 mo mammo instead of waiting until November. Explained to pt, per Dr Jana Hakim that he does not recommend 6 mo mammogram d/t exposure to unnecessary radiation, but that he would be glad to order MRI for pt if she wishes to proceed with that. Pt verbalized understanding and agreed to only have November mammo. Pt also requested refill for Anastrazole. This was refilled.

## 2019-10-22 DIAGNOSIS — Z01818 Encounter for other preprocedural examination: Secondary | ICD-10-CM | POA: Diagnosis not present

## 2019-10-28 ENCOUNTER — Other Ambulatory Visit: Payer: Self-pay | Admitting: General Surgery

## 2019-10-28 DIAGNOSIS — L723 Sebaceous cyst: Secondary | ICD-10-CM | POA: Diagnosis not present

## 2019-10-28 DIAGNOSIS — L720 Epidermal cyst: Secondary | ICD-10-CM | POA: Diagnosis not present

## 2019-11-22 ENCOUNTER — Other Ambulatory Visit: Payer: Self-pay | Admitting: Rheumatology

## 2019-11-22 DIAGNOSIS — M818 Other osteoporosis without current pathological fracture: Secondary | ICD-10-CM

## 2019-11-23 NOTE — Telephone Encounter (Signed)
Prescription sent in previously for Prolia.  Patient did not make a decision on starting therapy.  She will need updated labs.  Prescription refused with instruction to contact the office.   Mariella Saa, PharmD, [place], CPP Clinical Specialty Pharmacist (Rheumatology and Pulmonology)  11/23/2019 8:21 AM

## 2020-01-12 DIAGNOSIS — R1011 Right upper quadrant pain: Secondary | ICD-10-CM | POA: Diagnosis not present

## 2020-01-12 DIAGNOSIS — K5904 Chronic idiopathic constipation: Secondary | ICD-10-CM | POA: Diagnosis not present

## 2020-01-12 DIAGNOSIS — D126 Benign neoplasm of colon, unspecified: Secondary | ICD-10-CM | POA: Diagnosis not present

## 2020-01-13 DIAGNOSIS — R519 Headache, unspecified: Secondary | ICD-10-CM | POA: Diagnosis not present

## 2020-01-14 DIAGNOSIS — R195 Other fecal abnormalities: Secondary | ICD-10-CM | POA: Diagnosis not present

## 2020-01-14 DIAGNOSIS — N281 Cyst of kidney, acquired: Secondary | ICD-10-CM | POA: Diagnosis not present

## 2020-01-14 DIAGNOSIS — N852 Hypertrophy of uterus: Secondary | ICD-10-CM | POA: Diagnosis not present

## 2020-01-14 DIAGNOSIS — D1771 Benign lipomatous neoplasm of kidney: Secondary | ICD-10-CM | POA: Diagnosis not present

## 2020-01-24 DIAGNOSIS — C50911 Malignant neoplasm of unspecified site of right female breast: Secondary | ICD-10-CM | POA: Diagnosis not present

## 2020-02-09 DIAGNOSIS — C50911 Malignant neoplasm of unspecified site of right female breast: Secondary | ICD-10-CM | POA: Diagnosis not present

## 2020-02-09 NOTE — Progress Notes (Deleted)
Office Visit Note  Patient: Alice [name]             Date of Birth: 07-Oct-1960           MRN: [id number]             PCP: [name], MD Referring: [name], MD Visit Date: 02/23/2020 Occupation: @GUAROCC @  Subjective:  No chief complaint on file.   History of Present Illness: Alice [name] is a 59 y.o. female ***   Activities of Daily Living:  Patient reports morning stiffness for *** {minute/hour:19697}.   Patient {ACTIONS;DENIES/REPORTS:21021675::"Denies"} nocturnal pain.  Difficulty dressing/grooming: {ACTIONS;DENIES/REPORTS:21021675::"Denies"} Difficulty climbing stairs: {ACTIONS;DENIES/REPORTS:21021675::"Denies"} Difficulty getting out of chair: {ACTIONS;DENIES/REPORTS:21021675::"Denies"} Difficulty using hands for taps, buttons, cutlery, and/or writing: {ACTIONS;DENIES/REPORTS:21021675::"Denies"}  No Rheumatology ROS completed.   PMFS History:  Patient Active Problem List   Diagnosis Date Noted  . Acute midline low back pain without sciatica 01/15/2019  . Genetic testing 10/06/2018  . Family history of ovarian cancer   . Family history of lung cancer   . Personal history of breast cancer   . Malignant neoplasm of upper-outer quadrant of right breast in female, estrogen receptor positive ([place]) 09/29/2018    Past Medical History:  Diagnosis Date  . Anemia   . Breast cancer ([place])    right in 1997  . Family history of lung cancer   . Family history of ovarian cancer   . GERD (gastroesophageal reflux disease) 06/30/2018  . Herpes simplex type 2 infection 02/2006  . History of breast cancer 1997  . History of esophagogastroduodenoscopy (EGD) 09/15/2015   small hiatal hernia was noted otherwise all was normal  . Osteoporosis   . Personal history of breast cancer     Family History  Problem Relation Age of Onset  . Ovarian cancer Sister 37  . Dementia Mother   . Aneurysm Father    Past Surgical History:  Procedure Laterality  Date  . BREAST ENHANCEMENT SURGERY     s/p cancer and surgery, right  . BREAST LUMPECTOMY     right due to breast cancer  . MASTECTOMY W/ SENTINEL NODE BIOPSY Right 10/22/2018   Procedure: RIGHT MASTECTOMY WITH RIGHT AXILLARY SENTINEL LYMPH NODE BIOPSY;  Surgeon: Rolm Bookbinder, MD;  Location: [facility];  Service: General;  Laterality: Right;   Social History   Social History Narrative   Married lives with spouse   No children   Some college   Right handed   Does drink tea 1-2 a week green tea, no soda, no coffee    There is no immunization history on file for this patient.   Objective: Vital Signs: There were no vitals taken for this visit.   Physical Exam   Musculoskeletal Exam: ***  CDAI Exam: CDAI Score: -- Patient Global: --; Provider Global: -- Swollen: --; Tender: -- Joint Exam 02/23/2020   No joint exam has been documented for this visit   There is currently no information documented on the homunculus. Go to the Rheumatology activity and complete the homunculus joint exam.  Investigation: No additional findings.  Imaging: No results found.  Recent Labs: Lab Results  Component Value Date   WBC 5.4 07/16/2019   HGB 12.1 07/16/2019   PLT 284 07/16/2019   NA 140 07/16/2019   K 4.3 07/16/2019   CL 106 07/16/2019   CO2 27 07/16/2019   GLUCOSE 86 07/16/2019   BUN 6 07/16/2019   CREATININE 0.71 07/16/2019   BILITOT 0.4 07/16/2019  ALKPHOS 121 07/16/2019   AST 19 07/16/2019   ALT 9 07/16/2019   PROT 7.2 07/16/2019   ALBUMIN 3.5 07/16/2019   CALCIUM 9.6 08/24/2019   GFRAA >60 07/16/2019    Speciality Comments: No specialty comments available.  Procedures:  No procedures performed Allergies: Patient has no known allergies.   Assessment / Plan:     Visit Diagnoses: No diagnosis found.  Orders: No orders of the defined types were placed in this encounter.  No orders of the defined types were placed in this  encounter.   Face-to-face time spent with patient was *** minutes. Greater than 50% of time was spent in counseling and coordination of care.  Follow-Up Instructions: No follow-ups on file.   Earnestine Mealing, CMA  Note - This record has been created using Editor, commissioning.  Chart creation errors have been sought, but may not always  have been located. Such creation errors do not reflect on  the standard of medical care.

## 2020-02-15 NOTE — Progress Notes (Signed)
NEUROLOGY FOLLOW UP OFFICE NOTE  Alice [name] [id number]  HISTORY OF PRESENT ILLNESS: Alice [name] is a 59 year old female with osteoporosis, chronic constipation and right sided breast cancer (status post chemotherapy) who follows up for neck pain.  UPDATE: Not so much neck pain anymore, now headache.  She reports moderate sharp left temporal pain, rarely right side.  They are intermittent, lasting few seconds but several time throughout the day.  It occurs 2 to 3 days a week. No associated symptoms.  No longer having neck pain.  No longer having numbness and tingling in fingertips since finishing chemotherapy.  No triggers.  Sometimes takes Tylenol.  She rarely takes oxycodone.    Current NSAIDS:/steroids  none Current analgesics:  Extra-strength Tylenol Current triptans:  none Current ergotamine:  none Current anti-emetic:  none Current muscle relaxants:  none Current anti-anxiolytic:  none Current sleep aide:  none Current Antihypertensive medications:  none Current Antidepressant medications:  none Current Anticonvulsant medications:  none Current anti-CGRP:  none Current Vitamins/Herbal/Supplements:  none Current Antihistamines/Decongestants:  none Other therapy:  none Hormone/birth control:  none  HISTORY: In September 2020, she started experiencing 6-7/10 aching and sharp pain in left posterior side of her neck.  It lasts a few seconds and occurs 2 to 3 times a week.  She reports tingling in the fingertips of both hands which is attributed to chemotherapy.  However, she denies radicular pain, weakness or radicular numbness in the upper extremities.  No diplopia or dysphagia.  No preceding trauma.  It it gets too bad, she takes an extra-strength Tylenol, which is effective.  MRI of brain without contrast from 08/19/2018 was unremarkable except mild chronic small vessel ischemic changes and for cerebellar tonsillar ectopia extending 7 mm below the foramen  magnum, consistent with a Chiari 1 malformation with no evidence of compression.    PAST MEDICAL HISTORY: Past Medical History:  Diagnosis Date  . Anemia   . Breast cancer ([place])    right in 1997  . Family history of lung cancer   . Family history of ovarian cancer   . GERD (gastroesophageal reflux disease) 06/30/2018  . Herpes simplex type 2 infection 02/2006  . History of breast cancer 1997  . History of esophagogastroduodenoscopy (EGD) 09/15/2015   small hiatal hernia was noted otherwise all was normal  . Osteoporosis   . Personal history of breast cancer     MEDICATIONS: Current Outpatient Medications on File Prior to Visit  Medication Sig Dispense Refill  . anastrozole (ARIMIDEX) 1 MG tablet Take 1 tablet (1 mg total) by mouth daily. 90 tablet 4  . cholecalciferol (VITAMIN D3) 25 MCG (1000 UT) tablet Take 1 tablet (1,000 Units total) by mouth daily. (Patient not taking: Reported on 08/24/2019) 100 tablet 4  . denosumab (PROLIA) 60 MG/ML SOSY injection Inject 60 mg into the skin every 6 (six) months. 1 mL 0  . linaclotide (LINZESS) 290 MCG CAPS capsule Take by mouth.    . oxyCODONE-acetaminophen (PERCOCET/ROXICET) 5-325 MG tablet as needed.    . polyethylene glycol powder (GLYCOLAX/MIRALAX) 17 GM/SCOOP powder as needed.    . RESTASIS 0.05 % ophthalmic emulsion INSTILL 1 DROP IN BOTH EYES TWICE DAILY (Patient not taking: Reported on 08/24/2019) 60 each 1  . Vitamin D, Cholecalciferol, 25 MCG (1000 UT) CAPS Take 2 tablets by mouth daily. 60 capsule 5  . Vitamin D, Ergocalciferol, (DRISDOL) 1.25 MG (50000 UNIT) CAPS capsule Take 1 capsule (50,000 Units total) by mouth 2 (two) times  a week. (Patient not taking: Reported on 08/24/2019) 24 capsule 0   No current facility-administered medications on file prior to visit.    ALLERGIES: No Known Allergies  FAMILY HISTORY: Family History  Problem Relation Age of Onset  . Ovarian cancer Sister 92  . Dementia Mother   . Aneurysm  Father    SOCIAL HISTORY: Social History   Socioeconomic History  . Marital status: Married    Spouse name: Not on file  . Number of children: 0  . Years of education: Not on file  . Highest education level: Some college, no degree  Occupational History  . Not on file  Tobacco Use  . Smoking status: Never Smoker  . Smokeless tobacco: Never Used  Vaping Use  . Vaping Use: Never used  Substance and Sexual Activity  . Alcohol use: No    Alcohol/week: 0.0 standard drinks  . Drug use: No  . Sexual activity: Not Currently  Other Topics Concern  . Not on file  Social History Narrative   Married lives with spouse   No children   Some college   Right handed   Does drink tea 1-2 a week green tea, no soda, no coffee   Social Determinants of Health   Financial Resource Strain:   . Difficulty of Paying Living Expenses: Not on file  Food Insecurity:   . Worried About Charity fundraiser in the Last Year: Not on file  . Ran Out of Food in the Last Year: Not on file  Transportation Needs:   . Lack of Transportation (Medical): Not on file  . Lack of Transportation (Non-Medical): Not on file  Physical Activity:   . Days of Exercise per Week: Not on file  . Minutes of Exercise per Session: Not on file  Stress:   . Feeling of Stress : Not on file  Social Connections:   . Frequency of Communication with Friends and Family: Not on file  . Frequency of Social Gatherings with Friends and Family: Not on file  . Attends Religious Services: Not on file  . Active Member of Clubs or Organizations: Not on file  . Attends Archivist Meetings: Not on file  . Marital Status: Not on file  Intimate Partner Violence:   . Fear of Current or Ex-Partner: Not on file  . Emotionally Abused: Not on file  . Physically Abused: Not on file  . Sexually Abused: Not on file    PHYSICAL EXAM: Blood pressure 120/79, pulse 94, resp. rate 18, height 5\' 4"  (1.626 m), weight 174 lb (78.9 kg), SpO2  95 %. General: No acute distress.  Patient appears well-groomed.   Head:  Normocephalic/atraumatic Eyes:  Fundi examined but not visualized Neck: supple, no paraspinal tenderness, full range of motion Back: No paraspinal tenderness Neurological Exam: alert and oriented to person, place, and time. Attention span and concentration intact, recent and remote memory intact, fund of knowledge intact.  Speech fluent and not dysarthric, language intact.  CN II-XII intact. Bulk and tone normal, muscle strength 5/5 throughout.  Sensation to light touch, temperature and vibration intact.  Deep tendon reflexes 2+ throughout, toes downgoing.  Finger to nose and heel to shin testing intact.  Gait normal, Romberg negative.  IMPRESSION: 1.  Now with left sided headache.   2.  Chiari I malformation, incidental finding.  Does not appear symptomatic.  PLAN: 1.  Given history of breast cancer, repeat MRI of brain but also with contrast.  Will check  MRA to rule out symptomatic aneurysm. 2.  Start nortriptyline 10mg  at bedtime.  We can increase to 25mg  at bedtime in 4 to 6 weeks if needed. 3.  May take Tylenol but limit use of pain relievers to no more than 2 days out of week to prevent risk of rebound or medication-overuse headache. 4.  Headache diary 5.  Follow up 6 months.   Metta Clines, DO  CC: [name], MD

## 2020-02-17 ENCOUNTER — Other Ambulatory Visit: Payer: Self-pay

## 2020-02-17 ENCOUNTER — Encounter: Payer: Self-pay | Admitting: Neurology

## 2020-02-17 ENCOUNTER — Ambulatory Visit: Payer: BC Managed Care – PPO | Admitting: Neurology

## 2020-02-17 VITALS — BP 120/79 | HR 94 | Resp 18 | Ht 64.0 in | Wt 174.0 lb

## 2020-02-17 DIAGNOSIS — Z853 Personal history of malignant neoplasm of breast: Secondary | ICD-10-CM

## 2020-02-17 DIAGNOSIS — R519 Headache, unspecified: Secondary | ICD-10-CM

## 2020-02-17 MED ORDER — NORTRIPTYLINE HCL 10 MG PO CAPS: 10.0000 mg | ORAL_CAPSULE | Freq: Every day | ORAL | 5 refills | Status: DC

## 2020-02-17 NOTE — Patient Instructions (Addendum)
1.  Start nortriptyline 10mg  at bedtime.  If headaches not improved in 4 to 6 weeks, contact me and I will increase dose. 2.  May take Tylenol but Limit use of pain relievers to no more than 2 days out of week to prevent risk of rebound or medication-overuse headache. 3.  Check MRI of brain with and without contrast and MRA of head. We have sent a referral to [facility] for your MRI and they will call you directly to schedule your appointment. They are located at [address]. If you need to contact them directly please call [phone].  4.  Follow up with your eye doctor 5.  Follow up in 6 months.

## 2020-02-23 ENCOUNTER — Ambulatory Visit: Payer: BC Managed Care – PPO | Admitting: Physician Assistant

## 2020-02-23 DIAGNOSIS — Z801 Family history of malignant neoplasm of trachea, bronchus and lung: Secondary | ICD-10-CM

## 2020-02-23 DIAGNOSIS — Z8041 Family history of malignant neoplasm of ovary: Secondary | ICD-10-CM

## 2020-02-23 DIAGNOSIS — E559 Vitamin D deficiency, unspecified: Secondary | ICD-10-CM

## 2020-02-23 DIAGNOSIS — Z170 Estrogen receptor positive status [ER+]: Secondary | ICD-10-CM

## 2020-02-23 DIAGNOSIS — M818 Other osteoporosis without current pathological fracture: Secondary | ICD-10-CM

## 2020-02-29 DIAGNOSIS — E559 Vitamin D deficiency, unspecified: Secondary | ICD-10-CM | POA: Diagnosis not present

## 2020-02-29 DIAGNOSIS — Z124 Encounter for screening for malignant neoplasm of cervix: Secondary | ICD-10-CM | POA: Diagnosis not present

## 2020-02-29 DIAGNOSIS — Z01419 Encounter for gynecological examination (general) (routine) without abnormal findings: Secondary | ICD-10-CM | POA: Diagnosis not present

## 2020-02-29 DIAGNOSIS — Z6829 Body mass index (BMI) 29.0-29.9, adult: Secondary | ICD-10-CM | POA: Diagnosis not present

## 2020-03-02 DIAGNOSIS — R928 Other abnormal and inconclusive findings on diagnostic imaging of breast: Secondary | ICD-10-CM | POA: Diagnosis not present

## 2020-03-03 NOTE — Progress Notes (Deleted)
Office Visit Note  Patient: Alice [name]             Date of Birth: 07-11-1960           MRN: [id number]             PCP: [name], MD Referring: [name], MD Visit Date: 03/06/2020 Occupation: @GUAROCC @  Subjective:  Discuss medication options   History of Present Illness: Buford Gayler is a 59 y.o. female with history of osteoporosis.    Activities of Daily Living:  Patient reports morning stiffness for   {minute/hour:19697}.   Patient {ACTIONS;DENIES/REPORTS:21021675::"Denies"} nocturnal pain.  Difficulty dressing/grooming: {ACTIONS;DENIES/REPORTS:21021675::"Denies"} Difficulty climbing stairs: {ACTIONS;DENIES/REPORTS:21021675::"Denies"} Difficulty getting out of chair: {ACTIONS;DENIES/REPORTS:21021675::"Denies"} Difficulty using hands for taps, buttons, cutlery, and/or writing: {ACTIONS;DENIES/REPORTS:21021675::"Denies"}  Review of Systems  Constitutional: Negative for fatigue.  HENT: Negative for mouth sores, mouth dryness and nose dryness.   Eyes: Negative for pain, visual disturbance and dryness.  Respiratory: Negative for cough, hemoptysis, shortness of breath and difficulty breathing.   Cardiovascular: Negative for chest pain, palpitations, hypertension and swelling in legs/feet.  Gastrointestinal: Negative for blood in stool, constipation and diarrhea.  Endocrine: Negative for increased urination.  Genitourinary: Negative for painful urination.  Musculoskeletal: Negative for arthralgias, joint pain, joint swelling, myalgias, muscle weakness, morning stiffness, muscle tenderness and myalgias.  Skin: Negative for color change, pallor, rash, hair loss, nodules/bumps, skin tightness, ulcers and sensitivity to sunlight.  Allergic/Immunologic: Negative for susceptible to infections.  Neurological: Negative for dizziness, numbness, headaches and weakness.  Hematological: Negative for swollen glands.  Psychiatric/Behavioral: Negative for  depressed mood and sleep disturbance. The patient is not nervous/anxious.     PMFS History:  Patient Active Problem List   Diagnosis Date Noted  . Acute midline low back pain without sciatica 01/15/2019  . Genetic testing 10/06/2018  . Family history of ovarian cancer   . Family history of lung cancer   . Personal history of breast cancer   . Malignant neoplasm of upper-outer quadrant of right breast in female, estrogen receptor positive ([place]) 09/29/2018    Past Medical History:  Diagnosis Date  . Anemia   . Breast cancer ([place])    right in 1997  . Family history of lung cancer   . Family history of ovarian cancer   . GERD (gastroesophageal reflux disease) 06/30/2018  . Herpes simplex type 2 infection 02/2006  . History of breast cancer 1997  . History of esophagogastroduodenoscopy (EGD) 09/15/2015   small hiatal hernia was noted otherwise all was normal  . Osteoporosis   . Personal history of breast cancer     Family History  Problem Relation Age of Onset  . Ovarian cancer Sister 51  . Dementia Mother   . Aneurysm Father    Past Surgical History:  Procedure Laterality Date  . BREAST ENHANCEMENT SURGERY     s/p cancer and surgery, right  . BREAST LUMPECTOMY     right due to breast cancer  . MASTECTOMY W/ SENTINEL NODE BIOPSY Right 10/22/2018   Procedure: RIGHT MASTECTOMY WITH RIGHT AXILLARY SENTINEL LYMPH NODE BIOPSY;  Surgeon: Rolm Bookbinder, MD;  Location: [facility];  Service: General;  Laterality: Right;   Social History   Social History Narrative   Married lives with spouse   No children   Some college   Right handed   Does drink tea 1-2 a week green tea, no soda, no coffee    There is no immunization history on file for this patient.  Objective: Vital Signs: There were no vitals taken for this visit.   Physical Exam Vitals and nursing note reviewed.  Constitutional:      Appearance: She is well-developed.  HENT:     Head:  Normocephalic and atraumatic.  Eyes:     Conjunctiva/sclera: Conjunctivae normal.  Cardiovascular:     Rate and Rhythm: Normal rate.  Abdominal:     Palpations: Abdomen is soft.  Musculoskeletal:     Cervical back: Normal range of motion.  Skin:    General: Skin is warm and dry.     Capillary Refill: Capillary refill takes less than 2 seconds.  Neurological:     Mental Status: She is alert and oriented to person, place, and time.  Psychiatric:        Behavior: Behavior normal.      Musculoskeletal Exam:   CDAI Exam: CDAI Score: -- Patient Global: --; Provider Global: -- Swollen: --; Tender: -- Joint Exam 03/06/2020   No joint exam has been documented for this visit   There is currently no information documented on the homunculus. Go to the Rheumatology activity and complete the homunculus joint exam.  Investigation: No additional findings.  Imaging: No results found.  Recent Labs: Lab Results  Component Value Date   WBC 5.4 07/16/2019   HGB 12.1 07/16/2019   PLT 284 07/16/2019   NA 140 07/16/2019   K 4.3 07/16/2019   CL 106 07/16/2019   CO2 27 07/16/2019   GLUCOSE 86 07/16/2019   BUN 6 07/16/2019   CREATININE 0.71 07/16/2019   BILITOT 0.4 07/16/2019   ALKPHOS 121 07/16/2019   AST 19 07/16/2019   ALT 9 07/16/2019   PROT 7.2 07/16/2019   ALBUMIN 3.5 07/16/2019   CALCIUM 9.6 08/24/2019   GFRAA >60 07/16/2019    Speciality Comments: No specialty comments available.  Procedures:  No procedures performed Allergies: Patient has no known allergies.   Assessment / Plan:     Visit Diagnoses: No diagnosis found.  Orders: No orders of the defined types were placed in this encounter.  No orders of the defined types were placed in this encounter.    Follow-Up Instructions: No follow-ups on file.   Earnestine Mealing, CMA  Note - This record has been created using Editor, commissioning.  Chart creation errors have been sought, but may not always  have been  located. Such creation errors do not reflect on  the standard of medical care.

## 2020-03-06 ENCOUNTER — Ambulatory Visit: Payer: BC Managed Care – PPO | Admitting: Physician Assistant

## 2020-03-06 DIAGNOSIS — Z801 Family history of malignant neoplasm of trachea, bronchus and lung: Secondary | ICD-10-CM

## 2020-03-06 DIAGNOSIS — Z8041 Family history of malignant neoplasm of ovary: Secondary | ICD-10-CM

## 2020-03-06 DIAGNOSIS — E559 Vitamin D deficiency, unspecified: Secondary | ICD-10-CM

## 2020-03-06 DIAGNOSIS — Z170 Estrogen receptor positive status [ER+]: Secondary | ICD-10-CM

## 2020-03-06 DIAGNOSIS — M818 Other osteoporosis without current pathological fracture: Secondary | ICD-10-CM

## 2020-03-06 NOTE — Progress Notes (Signed)
Office Visit Note  Patient: Alice [name]             Date of Birth: August 10, 1960           MRN: [id number]             PCP: [name], MD Referring: [name], MD Visit Date: 03/07/2020 Occupation: @GUAROCC @  Subjective:  Discuss medication options   History of Present Illness: Alice [name] is a 59 y.o. female with history of osteoporosis. She is taking vitamin D 50,000 units by mouth once weekly.  She has been getting additional vitamin D and calcium in her diet.  She continues to be apprehensive to start prolia due to potential side effects but she is open to further discussing Prolia.  She denies any recent falls or fractures.  She has been exercising on her treadmill as well as performing weight lifting exercises on a regular basis. She denies any joint pain or joint swelling at this time.  She denies any other new questions or concerns.   Activities of Daily Living:  Patient reports morning stiffness for 0 minutes.   Patient Denies nocturnal pain.  Difficulty dressing/grooming: Denies Difficulty climbing stairs: Denies Difficulty getting out of chair: Denies Difficulty using hands for taps, buttons, cutlery, and/or writing: Denies  Review of Systems  Constitutional: Negative for fatigue.  HENT: Negative for mouth sores, mouth dryness and nose dryness.   Eyes: Negative for pain, visual disturbance and dryness.  Respiratory: Negative for cough, hemoptysis, shortness of breath and difficulty breathing.   Cardiovascular: Negative for chest pain, palpitations, hypertension and swelling in legs/feet.  Gastrointestinal: Negative for blood in stool, constipation and diarrhea.  Endocrine: Negative for increased urination.  Genitourinary: Negative for painful urination.  Musculoskeletal: Negative for arthralgias, joint pain, joint swelling, myalgias, muscle weakness, morning stiffness, muscle tenderness and myalgias.  Skin: Negative for color change,  pallor, rash, hair loss, nodules/bumps, skin tightness, ulcers and sensitivity to sunlight.  Allergic/Immunologic: Negative for susceptible to infections.  Neurological: Positive for headaches. Negative for dizziness, numbness and weakness.  Hematological: Negative for swollen glands.  Psychiatric/Behavioral: Negative for depressed mood and sleep disturbance. The patient is not nervous/anxious.     PMFS History:  Patient Active Problem List   Diagnosis Date Noted  . Acute midline low back pain without sciatica 01/15/2019  . Genetic testing 10/06/2018  . Family history of ovarian cancer   . Family history of lung cancer   . Personal history of breast cancer   . Malignant neoplasm of upper-outer quadrant of right breast in female, estrogen receptor positive ([place]) 09/29/2018    Past Medical History:  Diagnosis Date  . Anemia   . Breast cancer ([place])    right in 1997  . Family history of lung cancer   . Family history of ovarian cancer   . GERD (gastroesophageal reflux disease) 06/30/2018  . Herpes simplex type 2 infection 02/2006  . History of breast cancer 1997  . History of esophagogastroduodenoscopy (EGD) 09/15/2015   small hiatal hernia was noted otherwise all was normal  . Osteoporosis   . Personal history of breast cancer     Family History  Problem Relation Age of Onset  . Ovarian cancer Sister 74  . Dementia Mother   . Aneurysm Father    Past Surgical History:  Procedure Laterality Date  . BREAST ENHANCEMENT SURGERY     s/p cancer and surgery, right  . BREAST LUMPECTOMY     right due to breast cancer  .  MASTECTOMY W/ SENTINEL NODE BIOPSY Right 10/22/2018   Procedure: RIGHT MASTECTOMY WITH RIGHT AXILLARY SENTINEL LYMPH NODE BIOPSY;  Surgeon: Rolm Bookbinder, MD;  Location: [facility];  Service: General;  Laterality: Right;   Social History   Social History Narrative   Married lives with spouse   No children   Some college   Right handed   Does  drink tea 1-2 a week green tea, no soda, no coffee    There is no immunization history on file for this patient.   Objective: Vital Signs: BP 108/73 (BP Location: Left Arm, Patient Position: Sitting, Cuff Size: Normal)   Pulse 88   Resp 14   Ht 5\' 4"  (1.626 m)   Wt 176 lb 9.6 oz (80.1 kg)   BMI 30.31 kg/m    Physical Exam Vitals and nursing note reviewed.  Constitutional:      Appearance: She is well-developed.  HENT:     Head: Normocephalic and atraumatic.  Eyes:     Conjunctiva/sclera: Conjunctivae normal.  Cardiovascular:     Rate and Rhythm: Normal rate.  Pulmonary:     Effort: Pulmonary effort is normal.  Abdominal:     Palpations: Abdomen is soft.  Musculoskeletal:     Cervical back: Normal range of motion.  Skin:    General: Skin is warm and dry.     Capillary Refill: Capillary refill takes less than 2 seconds.  Neurological:     Mental Status: She is alert and oriented to person, place, and time.  Psychiatric:        Behavior: Behavior normal.      Musculoskeletal Exam: C-spine, thoracic spine, lumbar spine have good range of motion with no discomfort.  No midline spinal tenderness.  No SI joint tenderness.  Shoulder joints, elbow joints, wrist joints, MCPs, PIPs, DIPs have good range of motion with no synovitis.  She is able to make a complete fist bilaterally.  Hip joints have good range of motion with no discomfort.  No tenderness over trochanteric bursa bilaterally.  Knee joints have good range of motion with no warmth or effusion.  Ankle joints have good range of motion no tenderness or inflammation.  CDAI Exam: CDAI Score: -- Patient Global: --; Provider Global: -- Swollen: --; Tender: -- Joint Exam 03/07/2020   No joint exam has been documented for this visit   There is currently no information documented on the homunculus. Go to the Rheumatology activity and complete the homunculus joint exam.  Investigation: No additional findings.  Imaging: No  results found.  Recent Labs: Lab Results  Component Value Date   WBC 5.4 07/16/2019   HGB 12.1 07/16/2019   PLT 284 07/16/2019   NA 140 07/16/2019   K 4.3 07/16/2019   CL 106 07/16/2019   CO2 27 07/16/2019   GLUCOSE 86 07/16/2019   BUN 6 07/16/2019   CREATININE 0.71 07/16/2019   BILITOT 0.4 07/16/2019   ALKPHOS 121 07/16/2019   AST 19 07/16/2019   ALT 9 07/16/2019   PROT 7.2 07/16/2019   ALBUMIN 3.5 07/16/2019   CALCIUM 9.6 08/24/2019   GFRAA >60 07/16/2019    Speciality Comments: No specialty comments available.  Procedures:  No procedures performed Allergies: Patient has no known allergies.   Assessment / Plan:     Visit Diagnoses: Other osteoporosis without current pathological fracture: DEXA 12/10/2018 AP spine BMD 0.733 with T score -3.8 AP spine.  No comparison.  Results were reviewed again today in the office  and all questions were addressed.  She is postmenopausal, estrogen deficient, and has a personal history of breast cancer and underwent chemotherapy.  She has not had any falls or fractures.  She has no midline spinal tenderness consistent with a possible vertebral fracture. She is nave to osteoporosis treatment and is apprehensive to start on a new medication due to potential side effects.  We discussed that she is not a good candidate for an anabolic agent (forteo or tymlos) due to her history of breast cancer.  Not a good candidate for an oral bisphosphonate due to history of GERD and severity of osteoporosis.  At her office visit on 08/24/2019 we discussed the indications, contraindications, and potential side effects of starting on Prolia but she decided against proceeding with Prolia due to being apprehensive of potential side effects.  We reviewed all of this information again today in the office and all questions were addressed.  She would like to proceed with Prolia 60 mg subcutaneous injections every 6 months.  Her insurance has approved Prolia through May 2022.   Her vitamin D was 22.9 on 02/29/2020 and she is taking vitamin D 50,000 units once weekly prescribed by her gynecologist, which she is planning to have rechecked in 1 month.  We will postpone starting prolia until her vitamin D deficiency has been corrected.  She will require CBC and CMP prior to starting on Prolia. Prescription was sent to the pharmacy and will be shipped to our office.  Once received and lab work has been updated we will call her to schedule an appointment for the administration of the injection. She will follow-up in the office in 6 months.  Vitamin D deficiency: Vitamin D was 22.9 on 02/29/20.  She is taking vitamin D 50,000 units by mouth once weekly prescribed by her gynecologist.  We will postpone starting prolia until vitamin D deficiency has corrected.   Other medical conditions are listed as follows:   Malignant neoplasm of upper-outer quadrant of right breast in female, estrogen receptor positive ([place])  Family history of ovarian cancer  Family history of lung cancer  Orders: Orders Placed This Encounter  Procedures  . CBC with Differential/Platelet  . COMPLETE METABOLIC PANEL WITH GFR   Meds ordered this encounter  Medications  . denosumab (PROLIA) 60 MG/ML SOSY injection    Sig: Inject 60 mg into the skin every 6 (six) months.    Dispense:  1 mL    Refill:  0    Please ship to providers office: [address] [place], [place] [address]      Follow-Up Instructions: Return in about 6 months (around 09/04/2020) for Osteoporosis.   Ofilia Neas, PA-C  Note - This record has been created using Dragon software.  Chart creation errors have been sought, but may not always  have been located. Such creation errors do not reflect on  the standard of medical care.

## 2020-03-07 ENCOUNTER — Other Ambulatory Visit: Payer: Self-pay

## 2020-03-07 ENCOUNTER — Encounter: Payer: Self-pay | Admitting: Physician Assistant

## 2020-03-07 ENCOUNTER — Ambulatory Visit (INDEPENDENT_AMBULATORY_CARE_PROVIDER_SITE_OTHER): Payer: BC Managed Care – PPO | Admitting: Physician Assistant

## 2020-03-07 VITALS — BP 108/73 | HR 88 | Resp 14 | Ht 64.0 in | Wt 176.6 lb

## 2020-03-07 DIAGNOSIS — M818 Other osteoporosis without current pathological fracture: Secondary | ICD-10-CM | POA: Diagnosis not present

## 2020-03-07 DIAGNOSIS — E559 Vitamin D deficiency, unspecified: Secondary | ICD-10-CM

## 2020-03-07 DIAGNOSIS — Z8041 Family history of malignant neoplasm of ovary: Secondary | ICD-10-CM | POA: Diagnosis not present

## 2020-03-07 DIAGNOSIS — C50411 Malignant neoplasm of upper-outer quadrant of right female breast: Secondary | ICD-10-CM | POA: Diagnosis not present

## 2020-03-07 DIAGNOSIS — Z801 Family history of malignant neoplasm of trachea, bronchus and lung: Secondary | ICD-10-CM

## 2020-03-07 DIAGNOSIS — Z170 Estrogen receptor positive status [ER+]: Secondary | ICD-10-CM

## 2020-03-07 MED ORDER — DENOSUMAB 60 MG/ML ~~LOC~~ SOSY: 60.0000 mg | PREFILLED_SYRINGE | SUBCUTANEOUS | 0 refills | Status: DC

## 2020-03-07 NOTE — Patient Instructions (Signed)
Denosumab injection What is this medicine? DENOSUMAB (den oh sue mab) slows bone breakdown. Prolia is used to treat osteoporosis in women after menopause and in men, and in people who are taking corticosteroids for 6 months or more. Xgeva is used to treat a high calcium level due to cancer and to prevent bone fractures and other bone problems caused by multiple myeloma or cancer bone metastases. Xgeva is also used to treat giant cell tumor of the bone. This medicine may be used for other purposes; ask your health care provider or pharmacist if you have questions. COMMON BRAND NAME(S): Prolia, XGEVA What should I tell my health care provider before I take this medicine? They need to know if you have any of these conditions:  dental disease  having surgery or tooth extraction  infection  kidney disease  low levels of calcium or Vitamin D in the blood  malnutrition  on hemodialysis  skin conditions or sensitivity  thyroid or parathyroid disease  an unusual reaction to denosumab, other medicines, foods, dyes, or preservatives  pregnant or trying to get pregnant  breast-feeding How should I use this medicine? This medicine is for injection under the skin. It is given by a health care professional in a hospital or clinic setting. A special MedGuide will be given to you before each treatment. Be sure to read this information carefully each time. For Prolia, talk to your pediatrician regarding the use of this medicine in children. Special care may be needed. For Xgeva, talk to your pediatrician regarding the use of this medicine in children. While this drug may be prescribed for children as young as 13 years for selected conditions, precautions do apply. Overdosage: If you think you have taken too much of this medicine contact a poison control center or emergency room at once. NOTE: This medicine is only for you. Do not share this medicine with others. What if I miss a dose? It is  important not to miss your dose. Call your doctor or health care professional if you are unable to keep an appointment. What may interact with this medicine? Do not take this medicine with any of the following medications:  other medicines containing denosumab This medicine may also interact with the following medications:  medicines that lower your chance of fighting infection  steroid medicines like prednisone or cortisone This list may not describe all possible interactions. Give your health care provider a list of all the medicines, herbs, non-prescription drugs, or dietary supplements you use. Also tell them if you smoke, drink alcohol, or use illegal drugs. Some items may interact with your medicine. What should I watch for while using this medicine? Visit your doctor or health care professional for regular checks on your progress. Your doctor or health care professional may order blood tests and other tests to see how you are doing. Call your doctor or health care professional for advice if you get a fever, chills or sore throat, or other symptoms of a cold or flu. Do not treat yourself. This drug may decrease your body's ability to fight infection. Try to avoid being around people who are sick. You should make sure you get enough calcium and vitamin D while you are taking this medicine, unless your doctor tells you not to. Discuss the foods you eat and the vitamins you take with your health care professional. See your dentist regularly. Brush and floss your teeth as directed. Before you have any dental work done, tell your dentist you are   receiving this medicine. Do not become pregnant while taking this medicine or for 5 months after stopping it. Talk with your doctor or health care professional about your birth control options while taking this medicine. Women should inform their doctor if they wish to become pregnant or think they might be pregnant. There is a potential for serious side  effects to an unborn child. Talk to your health care professional or pharmacist for more information. What side effects may I notice from receiving this medicine? Side effects that you should report to your doctor or health care professional as soon as possible:  allergic reactions like skin rash, itching or hives, swelling of the face, lips, or tongue  bone pain  breathing problems  dizziness  jaw pain, especially after dental work  redness, blistering, peeling of the skin  signs and symptoms of infection like fever or chills; cough; sore throat; pain or trouble passing urine  signs of low calcium like fast heartbeat, muscle cramps or muscle pain; pain, tingling, numbness in the hands or feet; seizures  unusual bleeding or bruising  unusually weak or tired Side effects that usually do not require medical attention (report to your doctor or health care professional if they continue or are bothersome):  constipation  diarrhea  headache  joint pain  loss of appetite  muscle pain  runny nose  tiredness  upset stomach This list may not describe all possible side effects. Call your doctor for medical advice about side effects. You may report side effects to FDA at 1-800-FDA-1088. Where should I keep my medicine? This medicine is only given in a clinic, doctor's office, or other health care setting and will not be stored at home. NOTE: This sheet is a summary. It may not cover all possible information. If you have questions about this medicine, talk to your doctor, pharmacist, or health care provider.  2020 Elsevier/Gold Standard (2017-08-22 16:10:44)

## 2020-03-08 ENCOUNTER — Other Ambulatory Visit: Payer: Self-pay | Admitting: *Deleted

## 2020-03-08 ENCOUNTER — Ambulatory Visit
Admission: RE | Admit: 2020-03-08 | Discharge: 2020-03-08 | Disposition: A | Discharge status: Home / Self Care | Payer: BC Managed Care – PPO | Source: Ambulatory Visit | Attending: Neurology | Admitting: Neurology

## 2020-03-08 DIAGNOSIS — R519 Headache, unspecified: Secondary | ICD-10-CM | POA: Diagnosis not present

## 2020-03-08 DIAGNOSIS — I663 Occlusion and stenosis of cerebellar arteries: Secondary | ICD-10-CM | POA: Diagnosis not present

## 2020-03-08 DIAGNOSIS — M818 Other osteoporosis without current pathological fracture: Secondary | ICD-10-CM

## 2020-03-08 MED ORDER — GADOBENATE DIMEGLUMINE 529 MG/ML IV SOLN
15.0000 mL | Freq: Once | INTRAVENOUS | Status: AC | PRN
  Administered 2020-03-08: 15 mL via INTRAVENOUS

## 2020-03-09 ENCOUNTER — Other Ambulatory Visit: Payer: Self-pay | Admitting: Oncology

## 2020-03-09 DIAGNOSIS — D539 Nutritional anemia, unspecified: Secondary | ICD-10-CM

## 2020-03-09 LAB — COMPLETE METABOLIC PANEL WITH GFR
AG Ratio: 1.4 (calc) (ref 1.0–2.5)
ALT: 13 U/L (ref 6–29)
AST: 19 U/L (ref 10–35)
Albumin: 3.9 g/dL (ref 3.6–5.1)
Alkaline phosphatase (APISO): 89 U/L (ref 37–153)
BUN/Creatinine Ratio: 8 (calc) (ref 6–22)
BUN: 6 mg/dL — ABNORMAL LOW (ref 7–25)
CO2: 29 mmol/L (ref 20–32)
Calcium: 9.5 mg/dL (ref 8.6–10.4)
Chloride: 106 mmol/L (ref 98–110)
Creat: 0.75 mg/dL (ref 0.50–1.05)
GFR, Est African American: 102 mL/min/{1.73_m2} (ref >=60)
GFR, Est Non African American: 88 mL/min/{1.73_m2} (ref >=60)
Globulin: 2.7 g/dL (calc) (ref 1.9–3.7)
Glucose, Bld: 112 mg/dL (ref 65–139)
Potassium: 4 mmol/L (ref 3.5–5.3)
Sodium: 140 mmol/L (ref 135–146)
Total Bilirubin: 0.4 mg/dL (ref 0.2–1.2)
Total Protein: 6.6 g/dL (ref 6.1–8.1)

## 2020-03-09 LAB — CBC WITH DIFFERENTIAL/PLATELET
Absolute Monocytes: 358 cells/uL (ref 200–950)
Basophils Absolute: 28 cells/uL (ref 0–200)
Basophils Relative: 0.5 %
Eosinophils Absolute: 62 cells/uL (ref 15–500)
Eosinophils Relative: 1.1 %
HCT: 31.8 % — ABNORMAL LOW (ref 35.0–45.0)
Hemoglobin: 10.9 g/dL — ABNORMAL LOW (ref 11.7–15.5)
Lymphs Abs: 1590 cells/uL (ref 850–3900)
MCH: 38.7 pg — ABNORMAL HIGH (ref 27.0–33.0)
MCHC: 34.3 g/dL (ref 32.0–36.0)
MCV: 112.8 fL — ABNORMAL HIGH (ref 80.0–100.0)
MPV: 10.5 fL (ref 7.5–12.5)
Monocytes Relative: 6.4 %
Neutro Abs: 3562 cells/uL (ref 1500–7800)
Neutrophils Relative %: 63.6 %
Platelets: 329 10*3/uL (ref 140–400)
RBC: 2.82 10*6/uL — ABNORMAL LOW (ref 3.80–5.10)
RDW: 14.7 % (ref 11.0–15.0)
Total Lymphocyte: 28.4 %
WBC: 5.6 10*3/uL (ref 3.8–10.8)

## 2020-03-09 NOTE — Progress Notes (Signed)
Diane now has a macrocytic anemia.  We will send appropriate labs

## 2020-03-09 NOTE — Progress Notes (Signed)
BUN is borderline low. Rest of CMP WNL.   Calcium is WNL.   RBC count, hgb, and hct remain low.  MCV and MCH are elevated. Please notify the patient and forward lab work to oncologist and PCP.   She will be starting on prolia once vitamin D deficiency has been corrected.

## 2020-03-10 ENCOUNTER — Telehealth: Payer: Self-pay | Admitting: Oncology

## 2020-03-10 NOTE — Telephone Encounter (Signed)
Scheduled appt per 11/11 sch msg - pt is aware of appt date and time   

## 2020-03-13 ENCOUNTER — Other Ambulatory Visit: Payer: Self-pay

## 2020-03-13 ENCOUNTER — Other Ambulatory Visit: Payer: Self-pay | Admitting: Oncology

## 2020-03-13 ENCOUNTER — Inpatient Hospital Stay: Payer: BC Managed Care – PPO | Attending: Oncology

## 2020-03-13 DIAGNOSIS — C50411 Malignant neoplasm of upper-outer quadrant of right female breast: Secondary | ICD-10-CM | POA: Diagnosis not present

## 2020-03-13 DIAGNOSIS — E538 Deficiency of other specified B group vitamins: Secondary | ICD-10-CM | POA: Insufficient documentation

## 2020-03-13 DIAGNOSIS — Z170 Estrogen receptor positive status [ER+]: Secondary | ICD-10-CM | POA: Diagnosis not present

## 2020-03-13 DIAGNOSIS — D539 Nutritional anemia, unspecified: Secondary | ICD-10-CM

## 2020-03-13 LAB — COMPREHENSIVE METABOLIC PANEL
ALT: 12 U/L (ref 0–44)
AST: 23 U/L (ref 15–41)
Albumin: 3.7 g/dL (ref 3.5–5.0)
Alkaline Phosphatase: 97 U/L (ref 38–126)
Anion gap: 6 (ref 5–15)
BUN: 6 mg/dL (ref 6–20)
CO2: 26 mmol/L (ref 22–32)
Calcium: 9.4 mg/dL (ref 8.9–10.3)
Chloride: 108 mmol/L (ref 98–111)
Creatinine, Ser: 0.72 mg/dL (ref 0.44–1.00)
GFR, Estimated: >60 mL/min (ref >60)
Glucose, Bld: 87 mg/dL (ref 70–99)
Potassium: 4.5 mmol/L (ref 3.5–5.1)
Sodium: 140 mmol/L (ref 135–145)
Total Bilirubin: 0.5 mg/dL (ref 0.3–1.2)
Total Protein: 7.4 g/dL (ref 6.5–8.1)

## 2020-03-13 LAB — FERRITIN: Ferritin: 42 ng/mL (ref 11–307)

## 2020-03-13 LAB — CBC WITH DIFFERENTIAL/PLATELET
Abs Immature Granulocytes: 0.01 10*3/uL (ref 0.00–0.07)
Basophils Absolute: 0 10*3/uL (ref 0.0–0.1)
Basophils Relative: 1 %
Eosinophils Absolute: 0.1 10*3/uL (ref 0.0–0.5)
Eosinophils Relative: 2 %
HCT: 34.1 % — ABNORMAL LOW (ref 36.0–46.0)
Hemoglobin: 12 g/dL (ref 12.0–15.0)
Immature Granulocytes: 0 %
Lymphocytes Relative: 24 %
Lymphs Abs: 1.2 10*3/uL (ref 0.7–4.0)
MCH: 39 pg — ABNORMAL HIGH (ref 26.0–34.0)
MCHC: 35.2 g/dL (ref 30.0–36.0)
MCV: 110.7 fL — ABNORMAL HIGH (ref 80.0–100.0)
Monocytes Absolute: 0.4 10*3/uL (ref 0.1–1.0)
Monocytes Relative: 9 %
Neutro Abs: 3.1 10*3/uL (ref 1.7–7.7)
Neutrophils Relative %: 64 %
Platelets: 362 10*3/uL (ref 150–400)
RBC: 3.08 MIL/uL — ABNORMAL LOW (ref 3.87–5.11)
RDW: 13.7 % (ref 11.5–15.5)
WBC: 4.8 10*3/uL (ref 4.0–10.5)
nRBC: 0 % (ref 0.0–0.2)

## 2020-03-13 LAB — IRON AND TIBC
Iron: 96 ug/dL (ref 41–142)
Saturation Ratios: 29 % (ref 21–57)
TIBC: 324 ug/dL (ref 236–444)
UIBC: 228 ug/dL (ref 120–384)

## 2020-03-13 LAB — VITAMIN B12: Vitamin B-12: 145 pg/mL — ABNORMAL LOW (ref 180–914)

## 2020-03-13 LAB — RETICULOCYTES
Immature Retic Fract: 11.2 % (ref 2.3–15.9)
RBC.: 3.14 MIL/uL — ABNORMAL LOW (ref 3.87–5.11)
Retic Count, Absolute: 65.9 10*3/uL (ref 19.0–186.0)
Retic Ct Pct: 2.1 % (ref 0.4–3.1)

## 2020-03-13 LAB — SAVE SMEAR(SSMR), FOR PROVIDER SLIDE REVIEW

## 2020-03-13 LAB — FOLATE: Folate: 9.9 ng/mL (ref >5.9)

## 2020-03-13 NOTE — Progress Notes (Signed)
I called Alice [name] and let her know she is B12 deficient.  She does not eat any meat or eggs and that likely is the reason.  She tells me she is going to look for something she can eat that is natural and see if she can avoid the shots but I did offer her weekly shots x4 and then monthly shots as long as necessary.  She will call us tomorrow and let us know.  I also let her know that I will not be here December 2 when she was scheduled to see me.  We will have to reschedule that visit.

## 2020-03-14 ENCOUNTER — Telehealth: Payer: Self-pay

## 2020-03-14 NOTE — Telephone Encounter (Signed)
Pt called and states she would like to get scheduled for B12 injections. Made Dr Jana Hakim aware so pt can be scheduled appropriately.

## 2020-03-14 NOTE — Telephone Encounter (Signed)
This LPN spoke with pt who has decided she would like to proceed with B12 injections. Pt understands she needs to come in this week for an injection, then once a week for the next 3 weeks. After that she will come in every 4 weeks. Message sent to scheduling with these instructions. Pt understands she should be receiving a call from scheduling to arrange this.

## 2020-03-15 ENCOUNTER — Telehealth: Payer: Self-pay | Admitting: Oncology

## 2020-03-15 ENCOUNTER — Telehealth: Payer: Self-pay | Admitting: *Deleted

## 2020-03-15 DIAGNOSIS — M545 Low back pain, unspecified: Secondary | ICD-10-CM | POA: Diagnosis not present

## 2020-03-15 NOTE — Telephone Encounter (Signed)
scheduled appt per 11/16 sch msg - pt is aware of appt date and times

## 2020-03-16 ENCOUNTER — Other Ambulatory Visit: Payer: Self-pay | Admitting: Oncology

## 2020-03-16 ENCOUNTER — Inpatient Hospital Stay: Payer: BC Managed Care – PPO

## 2020-03-16 ENCOUNTER — Other Ambulatory Visit: Payer: Self-pay

## 2020-03-16 VITALS — BP 114/75 | HR 79 | Resp 16

## 2020-03-16 DIAGNOSIS — Z170 Estrogen receptor positive status [ER+]: Secondary | ICD-10-CM

## 2020-03-16 DIAGNOSIS — C50411 Malignant neoplasm of upper-outer quadrant of right female breast: Secondary | ICD-10-CM | POA: Diagnosis not present

## 2020-03-16 DIAGNOSIS — E538 Deficiency of other specified B group vitamins: Secondary | ICD-10-CM | POA: Diagnosis not present

## 2020-03-16 MED ORDER — CYANOCOBALAMIN 1000 MCG/ML IJ SOLN
1000.0000 ug | Freq: Once | INTRAMUSCULAR | Status: AC
  Administered 2020-03-16: 1000 ug via INTRAMUSCULAR

## 2020-03-16 MED ORDER — CYANOCOBALAMIN 1000 MCG/ML IJ SOLN
INTRAMUSCULAR | Status: AC
  Filled 2020-03-16: qty 1

## 2020-03-16 NOTE — Patient Instructions (Signed)

## 2020-03-17 NOTE — Telephone Encounter (Signed)
No entry 

## 2020-03-21 ENCOUNTER — Telehealth: Payer: Self-pay

## 2020-03-21 NOTE — Telephone Encounter (Signed)
Pt called to get clarification of MM done at [facility]. Pt states she had a Dr read it as Teola Bradley, but essentially wants to hear from [place] that it is a good report. This LPN went over results w/pt per Dr Jana Hakim. Pt verbalized thanks and understanding.

## 2020-03-22 ENCOUNTER — Other Ambulatory Visit: Payer: Self-pay

## 2020-03-22 ENCOUNTER — Inpatient Hospital Stay: Payer: BC Managed Care – PPO

## 2020-03-22 VITALS — BP 111/78 | HR 71 | Resp 16

## 2020-03-22 DIAGNOSIS — C50411 Malignant neoplasm of upper-outer quadrant of right female breast: Secondary | ICD-10-CM

## 2020-03-22 DIAGNOSIS — E538 Deficiency of other specified B group vitamins: Secondary | ICD-10-CM | POA: Diagnosis not present

## 2020-03-22 DIAGNOSIS — Z170 Estrogen receptor positive status [ER+]: Secondary | ICD-10-CM | POA: Diagnosis not present

## 2020-03-22 MED ORDER — CYANOCOBALAMIN 1000 MCG/ML IJ SOLN
INTRAMUSCULAR | Status: AC
  Filled 2020-03-22: qty 1

## 2020-03-22 MED ORDER — CYANOCOBALAMIN 1000 MCG/ML IJ SOLN
1000.0000 ug | Freq: Once | INTRAMUSCULAR | Status: AC
  Administered 2020-03-22: 1000 ug via INTRAMUSCULAR

## 2020-03-22 NOTE — Patient Instructions (Signed)

## 2020-03-30 ENCOUNTER — Other Ambulatory Visit: Payer: BC Managed Care – PPO

## 2020-03-30 ENCOUNTER — Inpatient Hospital Stay: Payer: BC Managed Care – PPO | Attending: Oncology

## 2020-03-30 ENCOUNTER — Other Ambulatory Visit: Payer: Self-pay

## 2020-03-30 ENCOUNTER — Ambulatory Visit: Payer: BC Managed Care – PPO | Admitting: Oncology

## 2020-03-30 VITALS — BP 118/88 | HR 77 | Temp 98.9°F | Resp 18

## 2020-03-30 DIAGNOSIS — Z170 Estrogen receptor positive status [ER+]: Secondary | ICD-10-CM | POA: Diagnosis not present

## 2020-03-30 DIAGNOSIS — C50411 Malignant neoplasm of upper-outer quadrant of right female breast: Secondary | ICD-10-CM | POA: Diagnosis not present

## 2020-03-30 DIAGNOSIS — E538 Deficiency of other specified B group vitamins: Secondary | ICD-10-CM | POA: Insufficient documentation

## 2020-03-30 MED ORDER — CYANOCOBALAMIN 1000 MCG/ML IJ SOLN
1000.0000 ug | Freq: Once | INTRAMUSCULAR | Status: AC
  Administered 2020-03-30: 1000 ug via INTRAMUSCULAR

## 2020-03-30 MED ORDER — CYANOCOBALAMIN 1000 MCG/ML IJ SOLN
INTRAMUSCULAR | Status: AC
  Filled 2020-03-30: qty 1

## 2020-03-30 NOTE — Patient Instructions (Signed)

## 2020-04-04 NOTE — Progress Notes (Signed)
Office Visit Note  Patient: Alice [name]             Date of Birth: 01/11/1961           MRN: [id number]             PCP: [name], MD Referring: [name], MD Visit Date: 04/05/2020 Occupation: @GUAROCC @  Subjective:  Discuss prolia  History of Present Illness: Alice [name] is a 59 y.o. female with history of osteoporosis and vitamin D deficiency. She is currently taking vitamin D 50,000 units by mouth once weekly for the past 2.5 months.  She has 2 more weeks of the prescription to take.  She is planning on starting on prolia once her vitamin D has been rechecked.  She still remains apprehensive to start on prolia due to possible side effects. She denies any recent falls or fractures.  She has been experiencing ntermittent lower back pain.  She denies any symptoms of radiculopathy.  She denies any muscle spasms.  She states she sits all day for work which exacerbates her discomfort. She also experiences occasional discomfort in the right knee joint but denies any joint swelling.  At times she experiences mild discomfort in the right knee joint when climbing steps.  She states she has also noticing some cracking and popping noises in her upper extremities with ROM but denies any pain with these incidents.     Activities of Daily Living:  Patient reports morning stiffness for 0  minutes.   Patient Reports nocturnal pain.  Difficulty dressing/grooming: Denies Difficulty climbing stairs: Denies Difficulty getting out of chair: Denies Difficulty using hands for taps, buttons, cutlery, and/or writing: Denies  Review of Systems  Constitutional: Negative for fatigue.  HENT: Negative for mouth sores, mouth dryness and nose dryness.   Eyes: Negative for pain, visual disturbance and dryness.  Respiratory: Negative for cough, hemoptysis, shortness of breath and difficulty breathing.   Cardiovascular: Negative for chest pain, palpitations, hypertension and  swelling in legs/feet.  Gastrointestinal: Negative for blood in stool, constipation and diarrhea.  Endocrine: Negative for increased urination.  Genitourinary: Negative for painful urination.  Musculoskeletal: Negative for arthralgias, joint pain, joint swelling, myalgias, muscle weakness, morning stiffness, muscle tenderness and myalgias.  Skin: Negative for color change, pallor, rash, hair loss, nodules/bumps, skin tightness, ulcers and sensitivity to sunlight.  Allergic/Immunologic: Negative for susceptible to infections.  Neurological: Negative for dizziness, numbness, headaches and weakness.  Hematological: Negative for swollen glands.  Psychiatric/Behavioral: Negative for depressed mood and sleep disturbance. The patient is not nervous/anxious.     PMFS History:  Patient Active Problem List   Diagnosis Date Noted  . Anemia, macrocytic 03/09/2020  . Acute midline low back pain without sciatica 01/15/2019  . Genetic testing 10/06/2018  . Family history of ovarian cancer   . Family history of lung cancer   . Personal history of breast cancer   . Malignant neoplasm of upper-outer quadrant of right breast in female, estrogen receptor positive ([place]) 09/29/2018    Past Medical History:  Diagnosis Date  . Anemia   . Breast cancer ([place])    right in 1997  . Family history of lung cancer   . Family history of ovarian cancer   . GERD (gastroesophageal reflux disease) 06/30/2018  . Herpes simplex type 2 infection 02/2006  . History of breast cancer 1997  . History of esophagogastroduodenoscopy (EGD) 09/15/2015   small hiatal hernia was noted otherwise all was normal  . Osteoporosis   .  Personal history of breast cancer     Family History  Problem Relation Age of Onset  . Ovarian cancer Sister 66  . Dementia Mother   . Aneurysm Father    Past Surgical History:  Procedure Laterality Date  . BREAST ENHANCEMENT SURGERY     s/p cancer and surgery, right  . BREAST LUMPECTOMY      right due to breast cancer  . MASTECTOMY W/ SENTINEL NODE BIOPSY Right 10/22/2018   Procedure: RIGHT MASTECTOMY WITH RIGHT AXILLARY SENTINEL LYMPH NODE BIOPSY;  Surgeon: Rolm Bookbinder, MD;  Location: [place];  Service: General;  Laterality: Right;   Social History   Social History Narrative   Married lives with spouse   No children   Some college   Right handed   Does drink tea 1-2 a week green tea, no soda, no coffee    There is no immunization history on file for this patient.   Objective: Vital Signs: BP 119/78 (BP Location: Left Arm, Patient Position: Sitting, Cuff Size: Normal)   Pulse 78   Resp 13   Ht 5\' 4"  (1.626 m)   Wt 179 lb 12.8 oz (81.6 kg)   BMI 30.86 kg/m    Physical Exam Vitals and nursing note reviewed.  Constitutional:      Appearance: She is well-developed.  HENT:     Head: Normocephalic and atraumatic.  Eyes:     Conjunctiva/sclera: Conjunctivae normal.  Pulmonary:     Effort: Pulmonary effort is normal.  Abdominal:     Palpations: Abdomen is soft.  Musculoskeletal:     Cervical back: Normal range of motion.  Skin:    General: Skin is warm and dry.     Capillary Refill: Capillary refill takes less than 2 seconds.  Neurological:     Mental Status: She is alert and oriented to person, place, and time.  Psychiatric:        Behavior: Behavior normal.      Musculoskeletal Exam: C-spine good ROM with no discomfort.  Some tenderness over the paraspinal muscles in the lumbar region.  No midline spinal tenderness.  No SI joint tenderness.  Shoulder joints, elbow joints, wrist joints, MCPs, PIPs, and DIPs good ROM with no synovitis.  Complete fist formation bilaterally.  Hip joints, knee joints, and ankle joints good ROM with no discomfort.  No warmth or effusion of knee joints.  No tenderness or swelling of ankle joints.   CDAI Exam: CDAI Score: -- Patient Global: --; Provider Global: -- Swollen: --; Tender: -- Joint Exam  04/05/2020   No joint exam has been documented for this visit   There is currently no information documented on the homunculus. Go to the Rheumatology activity and complete the homunculus joint exam.  Investigation: No additional findings.  Imaging: MR ANGIO HEAD WO CONTRAST  Result Date: 03/08/2020 CLINICAL DATA:  Headache, chronic, no new features EXAM: MRI HEAD WITHOUT AND WITH CONTRAST MRA HEAD WITHOUT CONTRAST TECHNIQUE: Multiplanar, multiecho pulse sequences of the brain and surrounding structures were obtained without and with intravenous contrast. Angiographic images of the head were obtained using MRA technique without contrast. CONTRAST:  23mL MULTIHANCE GADOBENATE DIMEGLUMINE 529 MG/ML IV SOLN COMPARISON:  08/19/2018 MRI head.  10/12/2002 head CT. FINDINGS: MRI HEAD FINDINGS Brain: No diffusion-weighted signal abnormality. No intracranial hemorrhage. No midline shift, ventriculomegaly or extra-axial fluid collection. No mass lesion. No abnormal enhancement. Cerebellar tonsillar descent of 6 mm is unchanged. Scattered supratentorial white matter T2 hyperintensities are slightly more  conspicuous than prior exam. Vascular: Small right frontal developmental venous anomaly. Please see MRA for additional findings. Skull and upper cervical spine: Normal marrow signal. Sinuses/Orbits: Normal orbits. Tiny left sphenoid sinus mucous retention cyst. No mastoid effusion. Other: None. MRA HEAD FINDINGS Anterior circulation: Patent ICAs, anterior and middle cerebral arteries. No significant stenosis, proximal occlusion, aneurysm, or vascular malformation. Posterior circulation: Dominant left vertebral artery. Patent V4 segments and proximal PICA. Patent basilar and superior cerebellar arteries. Mild proximal right superior cerebellar artery narrowing. Dominant right AICA. Patent posterior cerebral arteries. No proximal occlusion, aneurysm, or vascular malformation. Venous sinuses: As permitted by contrast  timing, patent. Anatomic variants: None of significance. IMPRESSION: No acute intracranial process. Sequela of Chiari 1 malformation, unchanged. Scattered supratentorial white matter signal abnormalities, slightly more conspicuous than prior exam. Differential includes chronic microvascular ischemic changes, post infectious/inflammatory sequela and migraines. Mild proximal right superior cerebellar artery narrowing. Otherwise unremarkable MRA. Electronically Signed   By: Primitivo Gauze M.D.   On: 03/08/2020 13:50   MR BRAIN W WO CONTRAST  Result Date: 03/08/2020 CLINICAL DATA:  Headache, chronic, no new features EXAM: MRI HEAD WITHOUT AND WITH CONTRAST MRA HEAD WITHOUT CONTRAST TECHNIQUE: Multiplanar, multiecho pulse sequences of the brain and surrounding structures were obtained without and with intravenous contrast. Angiographic images of the head were obtained using MRA technique without contrast. CONTRAST:  87mL MULTIHANCE GADOBENATE DIMEGLUMINE 529 MG/ML IV SOLN COMPARISON:  08/19/2018 MRI head.  10/12/2002 head CT. FINDINGS: MRI HEAD FINDINGS Brain: No diffusion-weighted signal abnormality. No intracranial hemorrhage. No midline shift, ventriculomegaly or extra-axial fluid collection. No mass lesion. No abnormal enhancement. Cerebellar tonsillar descent of 6 mm is unchanged. Scattered supratentorial white matter T2 hyperintensities are slightly more conspicuous than prior exam. Vascular: Small right frontal developmental venous anomaly. Please see MRA for additional findings. Skull and upper cervical spine: Normal marrow signal. Sinuses/Orbits: Normal orbits. Tiny left sphenoid sinus mucous retention cyst. No mastoid effusion. Other: None. MRA HEAD FINDINGS Anterior circulation: Patent ICAs, anterior and middle cerebral arteries. No significant stenosis, proximal occlusion, aneurysm, or vascular malformation. Posterior circulation: Dominant left vertebral artery. Patent V4 segments and proximal PICA.  Patent basilar and superior cerebellar arteries. Mild proximal right superior cerebellar artery narrowing. Dominant right AICA. Patent posterior cerebral arteries. No proximal occlusion, aneurysm, or vascular malformation. Venous sinuses: As permitted by contrast timing, patent. Anatomic variants: None of significance. IMPRESSION: No acute intracranial process. Sequela of Chiari 1 malformation, unchanged. Scattered supratentorial white matter signal abnormalities, slightly more conspicuous than prior exam. Differential includes chronic microvascular ischemic changes, post infectious/inflammatory sequela and migraines. Mild proximal right superior cerebellar artery narrowing. Otherwise unremarkable MRA. Electronically Signed   By: Primitivo Gauze M.D.   On: 03/08/2020 13:50    Recent Labs: Lab Results  Component Value Date   WBC 4.8 03/13/2020   HGB 12.0 03/13/2020   PLT 362 03/13/2020   NA 140 03/13/2020   K 4.5 03/13/2020   CL 108 03/13/2020   CO2 26 03/13/2020   GLUCOSE 87 03/13/2020   BUN 6 03/13/2020   CREATININE 0.72 03/13/2020   BILITOT 0.5 03/13/2020   ALKPHOS 97 03/13/2020   AST 23 03/13/2020   ALT 12 03/13/2020   PROT 7.4 03/13/2020   ALBUMIN 3.7 03/13/2020   CALCIUM 9.4 03/13/2020   GFRAA 102 03/08/2020    Speciality Comments: Start Prolia once Vitamin D defeciency has bee corrected.   Procedures:  No procedures performed Allergies: Patient has no known allergies.   Assessment / Plan:  Visit Diagnoses: Other osteoporosis without current pathological fracture - DEXA 12/10/2018 AP spine BMD 0.733 with T score -3.8 AP spine.  No comparison.  Patient is postmenopausal, estrogen deficient, and has a personal history of breast cancer (underwent chemotherapy).  No recent or previous fractures or falls.  She is naive to osteoporosis management. She is not a candidate for anabolic agents due to history of breast cancer.  Not a good candidate for oral bisphosphonate options due  to history of GERD. Her insurance has approved prolia through May 2022.  Indications, contraindications, and potential side effects were discussed in detail at [facility] on 08/24/19, 03/07/20, and today on 04/05/20. She is currently on a course of vitamin D 50,000 units once weekly and has 2 more weeks of the prescription to complete. Future order for vitamin D was placed today.  She will be scheduled for a nurse visit for the administration of the injection in the office pending vitamin D results.  She was given a handout of dietary sources of vitamin D and calcium.  She will follow up for routine visit in 6 months. - Plan: VITAMIN D 25 Hydroxy (Vit-D Deficiency, Fractures)  Vitamin D deficiency - Vitamin D was 22.9 on 02/29/20.  She is taking vitamin D 50,000 units by mouth once weekly.  She has 2 more weeks of the presciption and will be returning to recheck vitamin D level.  Future order for vitamin D was placed today. Plan: VITAMIN D 25 Hydroxy (Vit-D Deficiency, Fractures)  Chronic bilateral low back pain without sciatica: She experiences intermittent discomfort in her lower back, which is exacerbated by sitting for prolonged periods of time while working.  She is not experiencing any midline spinal tenderness or symptoms of radiculopathy. Tenderness over the paraspinal muscles bilaterally.  She was given a handout of back exercises and core strengthening exercises.  If her back pain persists or worsens she will return to the office for further evaluation.    Other medical conditions are listed as follows:   Malignant neoplasm of upper-outer quadrant of right breast in female, estrogen receptor positive ([place])  Family history of lung cancer  Family history of ovarian cancer    Orders: Orders Placed This Encounter  Procedures  . VITAMIN D 25 Hydroxy (Vit-D Deficiency, Fractures)   No orders of the defined types were placed in this encounter.    Follow-Up Instructions: Return in about 6 months  (around 10/04/2020) for Osteoporosis.   Ofilia Neas, PA-C  Note - This record has been created using Dragon software.  Chart creation errors have been sought, but may not always  have been located. Such creation errors do not reflect on  the standard of medical care.

## 2020-04-05 ENCOUNTER — Ambulatory Visit (INDEPENDENT_AMBULATORY_CARE_PROVIDER_SITE_OTHER): Payer: BC Managed Care – PPO | Admitting: Physician Assistant

## 2020-04-05 ENCOUNTER — Encounter: Payer: Self-pay | Admitting: Physician Assistant

## 2020-04-05 ENCOUNTER — Other Ambulatory Visit: Payer: Self-pay

## 2020-04-05 VITALS — BP 119/78 | HR 78 | Resp 13 | Ht 64.0 in | Wt 179.8 lb

## 2020-04-05 DIAGNOSIS — E559 Vitamin D deficiency, unspecified: Secondary | ICD-10-CM | POA: Diagnosis not present

## 2020-04-05 DIAGNOSIS — M545 Low back pain, unspecified: Secondary | ICD-10-CM

## 2020-04-05 DIAGNOSIS — M818 Other osteoporosis without current pathological fracture: Secondary | ICD-10-CM

## 2020-04-05 DIAGNOSIS — Z801 Family history of malignant neoplasm of trachea, bronchus and lung: Secondary | ICD-10-CM | POA: Diagnosis not present

## 2020-04-05 DIAGNOSIS — Z170 Estrogen receptor positive status [ER+]: Secondary | ICD-10-CM

## 2020-04-05 DIAGNOSIS — G8929 Other chronic pain: Secondary | ICD-10-CM

## 2020-04-05 DIAGNOSIS — C50411 Malignant neoplasm of upper-outer quadrant of right female breast: Secondary | ICD-10-CM

## 2020-04-05 DIAGNOSIS — Z8041 Family history of malignant neoplasm of ovary: Secondary | ICD-10-CM

## 2020-04-05 NOTE — Patient Instructions (Addendum)
Standing Labs We placed an order today for your standing lab work.   Please have your standing labs drawn in 2-3 weeks   If possible, please have your labs drawn 2 weeks prior to your appointment so that the provider can discuss your results at your appointment.  We have open lab daily Monday through Thursday from 8:30-12:30 PM and 1:30-4:30 PM and Friday from 8:30-12:30 PM and 1:30-4:00 PM at the office of Dr. Bo Merino, [facility].   Please be advised, patients with office appointments requiring lab work will take precedents over walk-in lab work.  If possible, please come for your lab work on Monday and Friday afternoons, as you may experience shorter wait times. The office is located at [address] [place], [place],  [address] No appointment is necessary.   Labs are drawn by [facility]. Please bring your co-pay at the time of your lab draw.  You may receive a bill from [facility] for your lab work.  If you wish to have your labs drawn at another location, please call the office 24 hours in advance to send orders.  If you have any questions regarding directions or hours of operation,  please call [phone].   As a reminder, please drink plenty of water prior to coming for your lab work. Thanks!     Core Strength Exercises  Core exercises help to build strength in the muscles between your ribs and your hips (abdominal muscles). These muscles help to support your body and keep your spine stable. It is important to maintain strength in your core to prevent injury and pain. Some activities, such as yoga and Pilates, can help to strengthen core muscles. You can also strengthen core muscles with exercises at home. It is important to talk to your health care provider before you start a new exercise routine. What are the benefits of core strength exercises? Core strength exercises can:  Reduce back pain.  Help to rebuild strength after a back or spine  injury.  Help to prevent injury during physical activity, especially injuries to the back and knees. How to do core strength exercises Repeat these exercises 10-15 times, or until you are tired. Do exercises exactly as told by your health care provider and adjust them as directed. It is normal to feel mild stretching, pulling, tightness, or discomfort as you do these exercises. If you feel any pain while doing these exercises, stop. If your pain continues or gets worse when doing core exercises, contact your health care provider. You may want to use a padded yoga or exercise mat for strength exercises that are done on the floor. Bridging  1. Lie on your back on a firm surface with your knees bent and your feet flat on the floor. 2. Raise your hips so that your knees, hips, and shoulders form a straight line together. Keep your abdominal muscles tight. 3. Hold this position for 3-5 seconds. 4. Slowly lower your hips to the starting position. 5. Let your muscles relax completely between repetitions. Single-leg bridge 1. Lie on your back on a firm surface with your knees bent and your feet flat on the floor. 2. Raise your hips so that your knees, hips, and shoulders form a straight line together. Keep your abdominal muscles tight. 3. Lift one foot off the floor, then completely straighten that leg. 4. Hold this position for 3-5 seconds. 5. Put the straight leg back down in the bent position. 6. Slowly lower your hips to the  starting position. 7. Repeat these steps using your other leg. Side bridge 1. Lie on your side with your knees bent. Prop yourself up on the elbow that is near the floor. 2. Using your abdominal muscles and your elbow that is on the floor, raise your body off the floor. Raise your hip so that your shoulder, hip, and foot form a straight line together. 3. Hold this position for 10 seconds. Keep your head and neck raised and away from your shoulder (in their normal, neutral  position). Keep your abdominal muscles tight. 4. Slowly lower your hip to the starting position. 5. Repeat and try to hold this position longer, working your way up to 30 seconds. Abdominal crunch 1. Lie on your back on a firm surface. Bend your knees and keep your feet flat on the floor. 2. Cross your arms over your chest. 3. Without bending your neck, tip your chin slightly toward your chest. 4. Tighten your abdominal muscles as you lift your chest just high enough to lift your shoulder blades off of the floor. Do not hold your breath. You can do this with short lifts or long lifts. 5. Slowly return to the starting position. Bird dog 1. Get on your hands and knees, with your legs shoulder-width apart and your arms under your shoulders. Keep your back straight. 2. Tighten your abdominal muscles. 3. Raise one of your legs off the floor and straighten it. Try to keep it parallel to the floor. 4. Slowly lower your leg to the starting position. 5. Raise one of your arms off the floor and straighten it. Try to keep it parallel to the floor. 6. Slowly lower your arm to the starting position. 7. Repeat with the other arm and leg. If possible, try raising a leg and arm at the same time, on opposite sides of the body. For example, raise your left hand and your right leg. Plank 1. Lie on your belly. 2. Prop up your body onto your forearms and your feet, keeping your legs straight. Your body should make a straight line between your shoulders and feet. 3. Hold this position for 10 seconds while keeping your abdominal muscles tight. 4. Lower your body to the starting position. 5. Repeat and try to hold this position longer, working your way up to 30 seconds. Cross-core strengthening 1. Stand with your feet shoulder-width apart. 2. Hold a ball out in front of you. Keep your arms straight. 3. Tighten your abdominal muscles and slowly rotate at your waist from side to side. Keep your feet flat. 4. Once  you are comfortable, try repeating this exercise with a heavier ball. Top core strengthening 1. Stand about 18 inches (46 cm) in front of a wall, with your back to the wall. 2. Keep your feet flat and shoulder-width apart. 3. Tighten your abdominal muscles. 4. Bend your hips and knees. 5. Slowly reach between your legs to touch the wall behind you. 6. Slowly stand back up. 7. Raise your arms over your head and reach behind you. 8. Return to the starting position. General tips  Do not do any exercises that cause pain. If you have pain while exercising, talk to your health care provider.  Always stretch before and after doing these exercises. This can help prevent injury.  Maintain a healthy weight. Ask your health care provider what weight is healthy for you. Contact a health care provider if:  You have back pain that gets worse or does not go away.  You feel pain while doing core strength exercises. Get help right away if:  You have severe pain that does not get better with medicine. Summary  Core exercises help to build strength in the muscles between your ribs and your waist.  Core muscles help to support your body and keep your spine stable.  Some activities, such as yoga and Pilates, can help to strengthen core muscles.  Core strength exercises can help back pain and can prevent injury.  If you feel any pain while doing core strength exercises, stop. This information is not intended to replace advice given to you by your health care provider. Make sure you discuss any questions you have with your health care provider. Document Revised: 08/05/2018 Document Reviewed: 09/04/2016 Elsevier Patient Education  [employer]. Back Exercises These exercises help to make your trunk and back strong. They also help to keep the lower back flexible. Doing these exercises can help to prevent back pain or lessen existing pain.  If you have back pain, try to do these exercises 2-3  times each day or as told by your doctor.  As you get better, do the exercises once each day. Repeat the exercises more often as told by your doctor.  To stop back pain from coming back, do the exercises once each day, or as told by your doctor. Exercises Single knee to chest Do these steps 3-5 times in a row for each leg: 1. Lie on your back on a firm bed or the floor with your legs stretched out. 2. Bring one knee to your chest. 3. Grab your knee or thigh with both hands and hold them it in place. 4. Pull on your knee until you feel a gentle stretch in your lower back or buttocks. 5. Keep doing the stretch for 10-30 seconds. 6. Slowly let go of your leg and straighten it. Pelvic tilt Do these steps 5-10 times in a row: 1. Lie on your back on a firm bed or the floor with your legs stretched out. 2. Bend your knees so they point up to the ceiling. Your feet should be flat on the floor. 3. Tighten your lower belly (abdomen) muscles to press your lower back against the floor. This will make your tailbone point up to the ceiling instead of pointing down to your feet or the floor. 4. Stay in this position for 5-10 seconds while you gently tighten your muscles and breathe evenly. Cat-cow Do these steps until your lower back bends more easily: 1. Get on your hands and knees on a firm surface. Keep your hands under your shoulders, and keep your knees under your hips. You may put padding under your knees. 2. Let your head hang down toward your chest. Tighten (contract) the muscles in your belly. Point your tailbone toward the floor so your lower back becomes rounded like the back of a cat. 3. Stay in this position for 5 seconds. 4. Slowly lift your head. Let the muscles of your belly relax. Point your tailbone up toward the ceiling so your back forms a sagging arch like the back of a cow. 5. Stay in this position for 5 seconds.  Press-ups Do these steps 5-10 times in a row: 1. Lie on your belly  (face-down) on the floor. 2. Place your hands near your head, about shoulder-width apart. 3. While you keep your back relaxed and keep your hips on the floor, slowly straighten your arms to raise the top half of your body  and lift your shoulders. Do not use your back muscles. You may change where you place your hands in order to make yourself more comfortable. 4. Stay in this position for 5 seconds. 5. Slowly return to lying flat on the floor.  Bridges Do these steps 10 times in a row: 1. Lie on your back on a firm surface. 2. Bend your knees so they point up to the ceiling. Your feet should be flat on the floor. Your arms should be flat at your sides, next to your body. 3. Tighten your butt muscles and lift your butt off the floor until your waist is almost as high as your knees. If you do not feel the muscles working in your butt and the back of your thighs, slide your feet 1-2 inches farther away from your butt. 4. Stay in this position for 3-5 seconds. 5. Slowly lower your butt to the floor, and let your butt muscles relax. If this exercise is too easy, try doing it with your arms crossed over your chest. Belly crunches Do these steps 5-10 times in a row: 1. Lie on your back on a firm bed or the floor with your legs stretched out. 2. Bend your knees so they point up to the ceiling. Your feet should be flat on the floor. 3. Cross your arms over your chest. 4. Tip your chin a little bit toward your chest but do not bend your neck. 5. Tighten your belly muscles and slowly raise your chest just enough to lift your shoulder blades a tiny bit off of the floor. Avoid raising your body higher than that, because it can put too much stress on your low back. 6. Slowly lower your chest and your head to the floor. Back lifts Do these steps 5-10 times in a row: 1. Lie on your belly (face-down) with your arms at your sides, and rest your forehead on the floor. 2. Tighten the muscles in your legs and  your butt. 3. Slowly lift your chest off of the floor while you keep your hips on the floor. Keep the back of your head in line with the curve in your back. Look at the floor while you do this. 4. Stay in this position for 3-5 seconds. 5. Slowly lower your chest and your face to the floor. Contact a doctor if:  Your back pain gets a lot worse when you do an exercise.  Your back pain does not get better 2 hours after you exercise. If you have any of these problems, stop doing the exercises. Do not do them again unless your doctor says it is okay. Get help right away if:  You have sudden, very bad back pain. If this happens, stop doing the exercises. Do not do them again unless your doctor says it is okay. This information is not intended to replace advice given to you by your health care provider. Make sure you discuss any questions you have with your health care provider. Document Revised: 01/08/2018 Document Reviewed: 01/08/2018 Elsevier Patient Education  2020 [employer], 15(4), [phone]. Retrieved February 02, 2018 from [email or web address].com/[email or web address]">  Knee Exercises Ask your health care provider which exercises are safe for you. Do exercises exactly as told by your health care provider and adjust them as directed. It is normal to feel mild stretching, pulling, tightness, or discomfort as you do these exercises. Stop right away if you feel sudden pain or your pain gets worse. Do not begin  these exercises until told by your health care provider. Stretching and range-of-motion exercises These exercises warm up your muscles and joints and improve the movement and flexibility of your knee. These exercises also help to relieve pain and swelling. Knee extension, prone 1. Lie on your abdomen (prone position) on a bed. 2. Place your left / right knee just beyond the edge of the surface so your knee is not on the bed. You can put a towel under your left / right  thigh just above your kneecap for comfort. 3. Relax your leg muscles and allow gravity to straighten your knee (extension). You should feel a stretch behind your left / right knee. 4. Hold this position for __________ seconds. 5. Scoot up so your knee is supported between repetitions. Repeat __________ times. Complete this exercise __________ times a day. Knee flexion, active  7. Lie on your back with both legs straight. If this causes back discomfort, bend your left / right knee so your foot is flat on the floor. 8. Slowly slide your left / right heel back toward your buttocks. Stop when you feel a gentle stretch in the front of your knee or thigh (flexion). 9. Hold this position for __________ seconds. 10. Slowly slide your left / right heel back to the starting position. Repeat __________ times. Complete this exercise __________ times a day. Quadriceps stretch, prone  5. Lie on your abdomen on a firm surface, such as a bed or padded floor. 6. Bend your left / right knee and hold your ankle. If you cannot reach your ankle or pant leg, loop a belt around your foot and grab the belt instead. 7. Gently pull your heel toward your buttocks. Your knee should not slide out to the side. You should feel a stretch in the front of your thigh and knee (quadriceps). 8. Hold this position for __________ seconds. Repeat __________ times. Complete this exercise __________ times a day. Hamstring, supine 6. Lie on your back (supine position). 7. Loop a belt or towel over the ball of your left / right foot. The ball of your foot is on the walking surface, right under your toes. 8. Straighten your left / right knee and slowly pull on the belt to raise your leg until you feel a gentle stretch behind your knee (hamstring). ? Do not let your knee bend while you do this. ? Keep your other leg flat on the floor. 9. Hold this position for __________ seconds. Repeat __________ times. Complete this exercise  __________ times a day. Strengthening exercises These exercises build strength and endurance in your knee. Endurance is the ability to use your muscles for a long time, even after they get tired. Quadriceps, isometric This exercise stretches the muscles in front of your thigh (quadriceps) without moving your knee joint (isometric). 6. Lie on your back with your left / right leg extended and your other knee bent. Put a rolled towel or small pillow under your knee if told by your health care provider. 7. Slowly tense the muscles in the front of your left / right thigh. You should see your kneecap slide up toward your hip or see increased dimpling just above the knee. This motion will push the back of the knee toward the floor. 8. For __________ seconds, hold the muscle as tight as you can without increasing your pain. 9. Relax the muscles slowly and completely. Repeat __________ times. Complete this exercise __________ times a day. Straight leg raises This exercise stretches the  muscles in front of your thigh (quadriceps) and the muscles that move your hips (hip flexors). 6. Lie on your back with your left / right leg extended and your other knee bent. 7. Tense the muscles in the front of your left / right thigh. You should see your kneecap slide up or see increased dimpling just above the knee. Your thigh may even shake a bit. 8. Keep these muscles tight as you raise your leg 4-6 inches (10-15 cm) off the floor. Do not let your knee bend. 9. Hold this position for __________ seconds. 10. Keep these muscles tense as you lower your leg. 11. Relax your muscles slowly and completely after each repetition. Repeat __________ times. Complete this exercise __________ times a day. Hamstring, isometric 7. Lie on your back on a firm surface. 8. Bend your left / right knee about __________ degrees. 9. Dig your left / right heel into the surface as if you are trying to pull it toward your buttocks. Tighten  the muscles in the back of your thighs (hamstring) to "dig" as hard as you can without increasing any pain. 10. Hold this position for __________ seconds. 11. Release the tension gradually and allow your muscles to relax completely for __________ seconds after each repetition. Repeat __________ times. Complete this exercise __________ times a day. Hamstring curls If told by your health care provider, do this exercise while wearing ankle weights. Begin with __________ lb weights. Then increase the weight by 1 lb (0.5 kg) increments. Do not wear ankle weights that are more than __________ lb. 6. Lie on your abdomen with your legs straight. 7. Bend your left / right knee as far as you can without feeling pain. Keep your hips flat against the floor. 8. Hold this position for __________ seconds. 9. Slowly lower your leg to the starting position. Repeat __________ times. Complete this exercise __________ times a day. Squats This exercise strengthens the muscles in front of your thigh and knee (quadriceps). 1. Stand in front of a table, with your feet and knees pointing straight ahead. You may rest your hands on the table for balance but not for support. 2. Slowly bend your knees and lower your hips like you are going to sit in a chair. ? Keep your weight over your heels, not over your toes. ? Keep your lower legs upright so they are parallel with the table legs. ? Do not let your hips go lower than your knees. ? Do not bend lower than told by your health care provider. ? If your knee pain increases, do not bend as low. 3. Hold the squat position for __________ seconds. 4. Slowly push with your legs to return to standing. Do not use your hands to pull yourself to standing. Repeat __________ times. Complete this exercise __________ times a day. Wall slides This exercise strengthens the muscles in front of your thigh and knee (quadriceps). 1. Lean your back against a smooth wall or door, and walk  your feet out 18-24 inches (46-61 cm) from it. 2. Place your feet hip-width apart. 3. Slowly slide down the wall or door until your knees bend __________ degrees. Keep your knees over your heels, not over your toes. Keep your knees in line with your hips. 4. Hold this position for __________ seconds. Repeat __________ times. Complete this exercise __________ times a day. Straight leg raises This exercise strengthens the muscles that rotate the leg at the hip and move it away from your body (hip abductors).  1. Lie on your side with your left / right leg in the top position. Lie so your head, shoulder, knee, and hip line up. You may bend your bottom knee to help you keep your balance. 2. Roll your hips slightly forward so your hips are stacked directly over each other and your left / right knee is facing forward. 3. Leading with your heel, lift your top leg 4-6 inches (10-15 cm). You should feel the muscles in your outer hip lifting. ? Do not let your foot drift forward. ? Do not let your knee roll toward the ceiling. 4. Hold this position for __________ seconds. 5. Slowly return your leg to the starting position. 6. Let your muscles relax completely after each repetition. Repeat __________ times. Complete this exercise __________ times a day. Straight leg raises This exercise stretches the muscles that move your hips away from the front of the pelvis (hip extensors). 1. Lie on your abdomen on a firm surface. You can put a pillow under your hips if that is more comfortable. 2. Tense the muscles in your buttocks and lift your left / right leg about 4-6 inches (10-15 cm). Keep your knee straight as you lift your leg. 3. Hold this position for __________ seconds. 4. Slowly lower your leg to the starting position. 5. Let your leg relax completely after each repetition. Repeat __________ times. Complete this exercise __________ times a day. This information is not intended to replace advice given to  you by your health care provider. Make sure you discuss any questions you have with your health care provider. Document Revised: 02/03/2018 Document Reviewed: 02/03/2018 Elsevier Patient Education  2020 [employer].

## 2020-04-06 ENCOUNTER — Inpatient Hospital Stay: Payer: BC Managed Care – PPO

## 2020-04-06 ENCOUNTER — Other Ambulatory Visit: Payer: Self-pay

## 2020-04-06 VITALS — BP 136/90 | HR 62 | Resp 18

## 2020-04-06 DIAGNOSIS — C50411 Malignant neoplasm of upper-outer quadrant of right female breast: Secondary | ICD-10-CM

## 2020-04-06 DIAGNOSIS — Z170 Estrogen receptor positive status [ER+]: Secondary | ICD-10-CM | POA: Diagnosis not present

## 2020-04-06 DIAGNOSIS — E538 Deficiency of other specified B group vitamins: Secondary | ICD-10-CM | POA: Diagnosis not present

## 2020-04-06 MED ORDER — CYANOCOBALAMIN 1000 MCG/ML IJ SOLN
INTRAMUSCULAR | Status: AC
  Filled 2020-04-06: qty 1

## 2020-04-06 MED ORDER — CYANOCOBALAMIN 1000 MCG/ML IJ SOLN
1000.0000 ug | Freq: Once | INTRAMUSCULAR | Status: AC
  Administered 2020-04-06: 1000 ug via INTRAMUSCULAR

## 2020-04-06 NOTE — Patient Instructions (Signed)
Cyanocobalamin, Pyridoxine, and Folate What is this medicine? A multivitamin containing folic acid, vitamin B6, and vitamin B12. This medicine may be used for other purposes; ask your health care provider or pharmacist if you have questions. COMMON BRAND NAME(S): AllanFol RX, AllanTex, Av-Vite FB, B Complex with Folic Acid, ComBgen, FaBB, Folamin, Folastin, Folbalin, Folbee, Folbic, Folcaps, Folgard, Folgard RX, Folgard RX 2.2, Folplex, Folplex 2.2, Foltabs 800, Foltx, Homocysteine Formula, Niva-Fol, NuFol, TL Gard RX, Virt-Gard, Virt-Vite, Virt-Vite Forte, Vita-Respa What should I tell my health care provider before I take this medicine? They need to know if you have any of these conditions:  bleeding or clotting disorder  history of anemia of any type  other chronic health condition  an unusual or allergic reaction to vitamins, other medicines, foods, dyes, or preservatives  pregnant or trying to get pregnant  breast-feeding How should I use this medicine? Take by mouth with a glass of water. May take with food. Follow the directions on the prescription label. It is usually given once a day. Do not take your medicine more often than directed. Contact your pediatrician regarding the use of this medicine in children. Special care may be needed. Overdosage: If you think you have taken too much of this medicine contact a poison control center or emergency room at once. NOTE: This medicine is only for you. Do not share this medicine with others. What if I miss a dose? If you miss a dose, take it as soon as you can. If it is almost time for your next dose, take only that dose. Do not take double or extra doses. What may interact with this medicine?  levodopa This list may not describe all possible interactions. Give your health care provider a list of all the medicines, herbs, non-prescription drugs, or dietary supplements you use. Also tell them if you smoke, drink alcohol, or use illegal  drugs. Some items may interact with your medicine. What should I watch for while using this medicine? See your health care professional for regular checks on your progress. Remember that vitamin supplements do not replace the need for good nutrition from a balanced diet. What side effects may I notice from receiving this medicine? Side effects that you should report to your doctor or health care professional as soon as possible:  allergic reaction such as skin rash or difficulty breathing  vomiting Side effects that usually do not require medical attention (report to your doctor or health care professional if they continue or are bothersome):  nausea  stomach upset This list may not describe all possible side effects. Call your doctor for medical advice about side effects. You may report side effects to FDA at 1-800-FDA-1088. Where should I keep my medicine? Keep out of the reach of children. Most vitamins should be stored at controlled room temperature. Check your specific product directions. Protect from heat and moisture. Throw away any unused medicine after the expiration date. NOTE: This sheet is a summary. It may not cover all possible information. If you have questions about this medicine, talk to your doctor, pharmacist, or health care provider.  2020 Elsevier/Gold Standard (2007-06-06 00:59:55)  

## 2020-04-13 DIAGNOSIS — R2242 Localized swelling, mass and lump, left lower limb: Secondary | ICD-10-CM | POA: Diagnosis not present

## 2020-04-13 DIAGNOSIS — S39012A Strain of muscle, fascia and tendon of lower back, initial encounter: Secondary | ICD-10-CM | POA: Diagnosis not present

## 2020-04-19 DIAGNOSIS — M7652 Patellar tendinitis, left knee: Secondary | ICD-10-CM | POA: Diagnosis not present

## 2020-04-24 DIAGNOSIS — M545 Low back pain, unspecified: Secondary | ICD-10-CM | POA: Diagnosis not present

## 2020-04-26 DIAGNOSIS — D1771 Benign lipomatous neoplasm of kidney: Secondary | ICD-10-CM | POA: Diagnosis not present

## 2020-04-26 DIAGNOSIS — N281 Cyst of kidney, acquired: Secondary | ICD-10-CM | POA: Diagnosis not present

## 2020-04-27 ENCOUNTER — Other Ambulatory Visit: Payer: Self-pay

## 2020-04-27 ENCOUNTER — Telehealth: Payer: Self-pay | Admitting: *Deleted

## 2020-04-27 ENCOUNTER — Inpatient Hospital Stay: Payer: BC Managed Care – PPO

## 2020-04-27 ENCOUNTER — Other Ambulatory Visit: Payer: Self-pay | Admitting: *Deleted

## 2020-04-27 DIAGNOSIS — Z170 Estrogen receptor positive status [ER+]: Secondary | ICD-10-CM | POA: Diagnosis not present

## 2020-04-27 DIAGNOSIS — C50411 Malignant neoplasm of upper-outer quadrant of right female breast: Secondary | ICD-10-CM | POA: Diagnosis not present

## 2020-04-27 DIAGNOSIS — D539 Nutritional anemia, unspecified: Secondary | ICD-10-CM

## 2020-04-27 DIAGNOSIS — E538 Deficiency of other specified B group vitamins: Secondary | ICD-10-CM | POA: Diagnosis not present

## 2020-04-27 LAB — CBC WITH DIFFERENTIAL/PLATELET
Abs Immature Granulocytes: 0.01 10*3/uL (ref 0.00–0.07)
Basophils Absolute: 0 10*3/uL (ref 0.0–0.1)
Basophils Relative: 1 %
Eosinophils Absolute: 0.1 10*3/uL (ref 0.0–0.5)
Eosinophils Relative: 1 %
HCT: 35.6 % — ABNORMAL LOW (ref 36.0–46.0)
Hemoglobin: 12.1 g/dL (ref 12.0–15.0)
Immature Granulocytes: 0 %
Lymphocytes Relative: 29 %
Lymphs Abs: 1.2 10*3/uL (ref 0.7–4.0)
MCH: 35.8 pg — ABNORMAL HIGH (ref 26.0–34.0)
MCHC: 34 g/dL (ref 30.0–36.0)
MCV: 105.3 fL — ABNORMAL HIGH (ref 80.0–100.0)
Monocytes Absolute: 0.3 10*3/uL (ref 0.1–1.0)
Monocytes Relative: 8 %
Neutro Abs: 2.6 10*3/uL (ref 1.7–7.7)
Neutrophils Relative %: 61 %
Platelets: 297 10*3/uL (ref 150–400)
RBC: 3.38 MIL/uL — ABNORMAL LOW (ref 3.87–5.11)
RDW: 12.2 % (ref 11.5–15.5)
WBC: 4.3 10*3/uL (ref 4.0–10.5)
nRBC: 0 % (ref 0.0–0.2)

## 2020-04-27 LAB — COMPREHENSIVE METABOLIC PANEL
ALT: 18 U/L (ref 0–44)
AST: 23 U/L (ref 15–41)
Albumin: 3.5 g/dL (ref 3.5–5.0)
Alkaline Phosphatase: 92 U/L (ref 38–126)
Anion gap: 4 — ABNORMAL LOW (ref 5–15)
BUN: 4 mg/dL — ABNORMAL LOW (ref 6–20)
CO2: 30 mmol/L (ref 22–32)
Calcium: 9.5 mg/dL (ref 8.9–10.3)
Chloride: 107 mmol/L (ref 98–111)
Creatinine, Ser: 0.71 mg/dL (ref 0.44–1.00)
GFR, Estimated: >60 mL/min (ref >60)
Glucose, Bld: 83 mg/dL (ref 70–99)
Potassium: 4.6 mmol/L (ref 3.5–5.1)
Sodium: 141 mmol/L (ref 135–145)
Total Bilirubin: 0.4 mg/dL (ref 0.3–1.2)
Total Protein: 7.1 g/dL (ref 6.5–8.1)

## 2020-04-27 NOTE — Progress Notes (Signed)
Pt presents to clinic for injection.  Per pt, she has received 4 doses of B12 injections, and was supposed to have labs drawn today.  Pt does not have lab appt.  Reviewed with Val, RN, lab appt entered.  Pt agreeable with plan.

## 2020-05-01 ENCOUNTER — Encounter: Payer: Self-pay | Admitting: Oncology

## 2020-05-03 ENCOUNTER — Other Ambulatory Visit: Payer: Self-pay

## 2020-05-03 DIAGNOSIS — Z170 Estrogen receptor positive status [ER+]: Secondary | ICD-10-CM

## 2020-05-03 DIAGNOSIS — C50411 Malignant neoplasm of upper-outer quadrant of right female breast: Secondary | ICD-10-CM

## 2020-05-03 DIAGNOSIS — D539 Nutritional anemia, unspecified: Secondary | ICD-10-CM

## 2020-05-03 NOTE — Progress Notes (Signed)
Pt called to obtain lab results from 12/30.  Pt given results.  Pt voiced she was also to have B12 checked, along with Vit D and Vit C.  RN confirmed with MD.  Pt scheduled for additional labs - pt verbalized understanding.

## 2020-05-05 ENCOUNTER — Other Ambulatory Visit: Payer: BC Managed Care – PPO

## 2020-05-08 DIAGNOSIS — D1771 Benign lipomatous neoplasm of kidney: Secondary | ICD-10-CM | POA: Diagnosis not present

## 2020-05-09 ENCOUNTER — Other Ambulatory Visit: Payer: BC Managed Care – PPO

## 2020-05-09 ENCOUNTER — Other Ambulatory Visit: Payer: Self-pay

## 2020-05-09 DIAGNOSIS — Z20822 Contact with and (suspected) exposure to covid-19: Secondary | ICD-10-CM

## 2020-05-11 ENCOUNTER — Telehealth: Payer: Self-pay | Admitting: Rheumatology

## 2020-05-11 ENCOUNTER — Telehealth: Payer: Self-pay | Admitting: *Deleted

## 2020-05-11 DIAGNOSIS — H04123 Dry eye syndrome of bilateral lacrimal glands: Secondary | ICD-10-CM | POA: Diagnosis not present

## 2020-05-11 DIAGNOSIS — H52203 Unspecified astigmatism, bilateral: Secondary | ICD-10-CM | POA: Diagnosis not present

## 2020-05-11 NOTE — Telephone Encounter (Signed)
Patient wants to know when she is to get her Vit. C level checked? She has finished with Vit. D. Patient wants to proceed with Prolia. Please call to advise.

## 2020-05-11 NOTE — Telephone Encounter (Signed)
This RN attempted to return call to pt with no answer. General return call to nurse message left with this RN's name.

## 2020-05-11 NOTE — Telephone Encounter (Signed)
Patient advised she is due to have her Vitamin D level rechecked around 05/31/2020. Patient advised that she would need to contact her PCP for the Vitamin C level to be checked. Patient advised to start Prolia we will need the vitamin D rechecked not the Vitamin C level. Patient expressed understanding. Patient advised to call the office prior to coming to make sure we have a lab tech in the office.

## 2020-05-12 NOTE — Telephone Encounter (Signed)
No entry 

## 2020-05-13 LAB — NOVEL CORONAVIRUS, NAA: SARS-CoV-2, NAA: NOT DETECTED

## 2020-05-19 DIAGNOSIS — Z0000 Encounter for general adult medical examination without abnormal findings: Secondary | ICD-10-CM | POA: Diagnosis not present

## 2020-05-22 DIAGNOSIS — E559 Vitamin D deficiency, unspecified: Secondary | ICD-10-CM | POA: Diagnosis not present

## 2020-05-22 DIAGNOSIS — Z79899 Other long term (current) drug therapy: Secondary | ICD-10-CM | POA: Diagnosis not present

## 2020-05-25 ENCOUNTER — Other Ambulatory Visit: Payer: Self-pay

## 2020-05-25 ENCOUNTER — Inpatient Hospital Stay: Payer: BC Managed Care – PPO | Attending: Oncology

## 2020-05-25 VITALS — BP 129/80 | HR 75 | Resp 18

## 2020-05-25 DIAGNOSIS — E538 Deficiency of other specified B group vitamins: Secondary | ICD-10-CM | POA: Diagnosis not present

## 2020-05-25 DIAGNOSIS — Z170 Estrogen receptor positive status [ER+]: Secondary | ICD-10-CM

## 2020-05-25 DIAGNOSIS — C50411 Malignant neoplasm of upper-outer quadrant of right female breast: Secondary | ICD-10-CM

## 2020-05-25 MED ORDER — CYANOCOBALAMIN 1000 MCG/ML IJ SOLN
1000.0000 ug | Freq: Once | INTRAMUSCULAR | Status: AC
  Administered 2020-05-25: 1000 ug via INTRAMUSCULAR

## 2020-05-25 MED ORDER — CYANOCOBALAMIN 1000 MCG/ML IJ SOLN
INTRAMUSCULAR | Status: AC
  Filled 2020-05-25: qty 1

## 2020-05-25 NOTE — Patient Instructions (Signed)

## 2020-06-14 ENCOUNTER — Telehealth: Payer: Self-pay

## 2020-06-14 NOTE — Telephone Encounter (Signed)
Returned call to pt. Clarified next appointment date and time. 06/22/20 arrive at 10:15. Pt verbalized understanding.

## 2020-06-20 ENCOUNTER — Other Ambulatory Visit: Payer: Self-pay | Admitting: Family Medicine

## 2020-06-20 DIAGNOSIS — Z124 Encounter for screening for malignant neoplasm of cervix: Secondary | ICD-10-CM | POA: Diagnosis not present

## 2020-06-20 DIAGNOSIS — M810 Age-related osteoporosis without current pathological fracture: Secondary | ICD-10-CM | POA: Diagnosis not present

## 2020-06-20 DIAGNOSIS — Z01419 Encounter for gynecological examination (general) (routine) without abnormal findings: Secondary | ICD-10-CM | POA: Diagnosis not present

## 2020-06-20 DIAGNOSIS — Z6830 Body mass index (BMI) 30.0-30.9, adult: Secondary | ICD-10-CM | POA: Diagnosis not present

## 2020-06-20 DIAGNOSIS — R102 Pelvic and perineal pain: Secondary | ICD-10-CM | POA: Diagnosis not present

## 2020-06-21 ENCOUNTER — Other Ambulatory Visit: Payer: Self-pay | Admitting: Family Medicine

## 2020-06-21 DIAGNOSIS — D1771 Benign lipomatous neoplasm of kidney: Secondary | ICD-10-CM

## 2020-06-21 NOTE — Progress Notes (Signed)
[facility]  [phone] [phone]    ID: [name] DOB: 1960-11-12  MR#: [id number]  HCW#:[id number]  Patient Care Team: [name], MD as PCP - General (Family Medicine) Mauro Kaufmann, RN as Oncology Nurse Navigator Rockwell Germany, RN as Oncology Nurse Navigator Karolyn Messing, Virgie Dad, MD as Consulting Physician (Oncology) Kyung Rudd, MD as Consulting Physician (Radiation Oncology) Rolm Bookbinder, MD as Consulting Physician (General Surgery) Earnstine Regal, PA-C as Consulting Physician (Obstetrics and Gynecology) Juanita Craver, MD as Consulting Physician (Gastroenterology) [name], MD as Consulting Physician (Family Medicine) Cristine Polio, MD as Consulting Physician (Plastic Surgery) Chauncey Cruel, MD OTHER MD:  CHIEF COMPLAINT: Estrogen receptor positive breast cancer (s/p right mastectomy)  CURRENT TREATMENT: Anastrozole   INTERVAL HISTORY: Emilia was seen today for follow up of her estrogen receptor positive breast cancer.  She restarted anastrozole on 04/06/2019.  She tolerates that generally well, but is concerned because of the osteoporosis problem.  Her most recent bone density screening, performed on 12/10/2018, showed a T-score of -3.8, which is considered osteoporotic.  We have discussed denosumab/Prolia but she has opted against.  She is taking larger doses of vitamin D.  Since her last visit, she underwent left diagnostic mammography with tomography at [facility] on 03/02/2020 showing: breast density category A; no evidence of malignancy.   She also underwent head MRI/MRA on 03/08/2020 showing no acute intracranial process.  She also underwent CT abdomen on 05/08/2020 at [place] showing: right renal angiomyolipoma, not significantly changed; no new abnormality.   REVIEW OF SYSTEMS: Rosio is working full-time and exercising regularly with her treadmill, taking walks, and doing some weights.  She  climbs stairs at work.  A detailed review of systems today was otherwise noncontributory   COVID 19 VACCINATION STATUS: Not vaccinated as of 06/22/2020   HISTORY OF CURRENT ILLNESS: From the original intake note:  Bethannie Iglehart has a history of remote right breast cancer in 1997, treated with right lumpectomy and radiation in [facility].  The patient did not receive chemotherapy or antiestrogen treatments.  More recently she presented for her annual mammogram with a non-tender area of firmness in the right breast at 9 o'clock for 2 months. She underwent bilateral diagnostic mammography with tomography and right breast ultrasonography at [facility] on 09/08/2018 showing: breast density category A; scattered-appearing fibroglandular tissue at the lumpectomy site in the upper outer right breast middle depth spanning 3.4 cm, with indistinct margins, was not seen on prior mammogram from 2018.  There was also a keloid scar in the inferior medial left breast.  On physical exam there was a rectangular area of hardness measuring up to 7 cm in the right breast at 9:00, corresponding with the prior lumpectomy and consistent with radiation change.  Ultrasound of this area showed only postlumpectomy change, with no suspicious or discrete mass.  Ultrasound of the axilla was benign  Accordingly on 09/23/2018 she proceeded to biopsy of the right breast area in question. The pathology from this procedure [facility] 20-07/02/2000 showed: invasive ductal carcinoma, E-cadherin positive,  grade 3,  Prognostic indicators significant for: estrogen receptor at 100% positive and progesterone receptor, 30% positive, both with strong staining intensity proliferation marker Ki67 at 70%. HER2 negative by immunohistochemistry (0).  The patient's subsequent history is as detailed below.   PAST MEDICAL HISTORY: Past Medical History:  Diagnosis Date   Anemia    Breast cancer ([place])    right in 1997   Family history of lung  cancer  Family history of ovarian cancer    GERD (gastroesophageal reflux disease) 06/30/2018   Herpes simplex type 2 infection 02/2006   History of breast cancer 1997   History of esophagogastroduodenoscopy (EGD) 09/15/2015   small hiatal hernia was noted otherwise all was normal   Osteoporosis    Personal history of breast cancer     PAST SURGICAL HISTORY: Past Surgical History:  Procedure Laterality Date   BREAST ENHANCEMENT SURGERY     s/p cancer and surgery, right   BREAST LUMPECTOMY     right due to breast cancer   MASTECTOMY W/ SENTINEL NODE BIOPSY Right 10/22/2018   Procedure: RIGHT MASTECTOMY WITH RIGHT AXILLARY SENTINEL LYMPH NODE BIOPSY;  Surgeon: Rolm Bookbinder, MD;  Location: [place];  Service: General;  Laterality: Right;  wisdom teeth removal   FAMILY HISTORY: Family History  Problem Relation Age of Onset   Ovarian cancer Sister 31   Dementia Mother    Aneurysm Father   Patient's father was 71 years old when he died from aneurysm. Patient's mother died from dementia at age 64. Her sister was diagnosed with ovarian cancer at age 44, and this subsequently took her life.  In total of the patient has 5 brothers and 2 sisters.  She is not aware of other cancers in the family   GYNECOLOGIC HISTORY:  No LMP recorded. Patient is postmenopausal. Menarche: 60 years old [place] P 0 LMP age 65 Contraceptive none HRT none  Hysterectomy? no BSO? 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 She is married. Husband Edmonia Caprio is from [place], so his native language is [place]. She lives at home with her husband. They have no children. She attends a [employer] in [place].    ADVANCED DIRECTIVES: In the absence of documents to the contrary her husband is automatically her HCPOA.   HEALTH MAINTENANCE: Social History   Tobacco Use   Smoking status: Never Smoker    Smokeless tobacco: Never Used  Vaping Use   Vaping Use: Never used  Substance Use Topics   Alcohol use: No    Alcohol/week: 0.0 standard drinks   Drug use: No     Colonoscopy: endoscopy in 2017, colonoscopy due 2020 but delayed due to virus  PAP: 12/03/2017, normal  Bone density: 11/2018, -3.8   No Known Allergies  Current Outpatient Medications  Medication Sig Dispense Refill   anastrozole (ARIMIDEX) 1 MG tablet Take 1 tablet (1 mg total) by mouth daily. 90 tablet 4   denosumab (PROLIA) 60 MG/ML SOSY injection Inject 60 mg into the skin every 6 (six) months. (Patient not taking: Reported on 04/05/2020) 1 mL 0   RESTASIS 0.05 % ophthalmic emulsion INSTILL 1 DROP IN BOTH EYES TWICE DAILY (Patient taking differently: as needed. ) 60 each 1   Vitamin D, Ergocalciferol, (DRISDOL) 1.25 MG (50000 UNIT) CAPS capsule Take 1 capsule (50,000 Units total) by mouth 2 (two) times a week. (Patient taking differently: Take 50,000 Units by mouth every 7 (seven) days. ) 24 capsule 0   No current facility-administered medications for this visit.    OBJECTIVE: African-American woman who appears well  Vitals:   06/22/20 1118  BP: 121/81  Pulse: 72  Temp: 97.6 F (36.4 C)  Resp: 18  Height: 5' 4"  (1.626 m)  Weight: 187 lb 4.8 oz (85 kg)  SpO2: 100%  TempSrc: Temporal  BMI (Calculated): 32.13    [employer]  06/22/20 1118  Weight: 187 lb 4.8 oz (85 kg)   ECOG: 1  Sclerae unicteric, EOMs intact Wearing a mask No cervical or supraclavicular adenopathy Lungs no rales or rhonchi Heart regular rate and rhythm Abd soft, nontender, positive bowel sounds MSK no focal spinal tenderness, no upper extremity lymphedema Neuro: nonfocal, well oriented, appropriate affect Breasts: Status post right mastectomy.  There is no evidence of chest wall recurrence.  Status post left breast reduction.  There are no suspicious findings.   LAB RESULTS:  CMP     Component Value Date/Time   NA 141  04/27/2020 1052   K 4.6 04/27/2020 1052   CL 107 04/27/2020 1052   CO2 30 04/27/2020 1052   GLUCOSE 83 04/27/2020 1052   BUN 4 (L) 04/27/2020 1052   CREATININE 0.71 04/27/2020 1052   CREATININE 0.75 03/08/2020 1544   CALCIUM 9.5 04/27/2020 1052   PROT 7.1 04/27/2020 1052   ALBUMIN 3.5 04/27/2020 1052   AST 23 04/27/2020 1052   AST 49 (H) 02/10/2019 1229   ALT 18 04/27/2020 1052   ALT 23 02/10/2019 1229   ALKPHOS 92 04/27/2020 1052   BILITOT 0.4 04/27/2020 1052   BILITOT 0.4 02/10/2019 1229   GFRNONAA >60 04/27/2020 1052   GFRNONAA 88 03/08/2020 1544   GFRAA 102 03/08/2020 1544    No results found for: TOTALPROTELP, ALBUMINELP, A1GS, A2GS, BETS, BETA2SER, GAMS, MSPIKE, SPEI  No results found for: KPAFRELGTCHN, LAMBDASER, KAPLAMBRATIO  Lab Results  Component Value Date   WBC 4.4 06/22/2020   NEUTROABS 2.5 06/22/2020   HGB 11.4 (L) 06/22/2020   HCT 34.6 (L) 06/22/2020   MCV 98.9 06/22/2020   PLT 289 06/22/2020   No results found for: LABCA2  No components found for: VEHMCN470  No results for input(s): INR in the last 168 hours.  No results found for: LABCA2  No results found for: JGG836  No results found for: OQH476  No results found for: LYY503  No results found for: CA2729  No components found for: HGQUANT  No results found for: CEA1 / No results found for: CEA1  No results found for: AFPTUMOR  No results found for: CHROMOGRNA  No results found for: HGBA, HGBA2QUANT, HGBFQUANT, HGBSQUAN (Hemoglobinopathy evaluation)   No results found for: LDH  Lab Results  Component Value Date   IRON 96 03/13/2020   TIBC 324 03/13/2020   IRONPCTSAT 29 03/13/2020   (Iron and TIBC)  Lab Results  Component Value Date   FERRITIN 42 03/13/2020    Urinalysis    Component Value Date/Time   COLORURINE YELLOW 12/30/2018 0948   APPEARANCEUR CLEAR 12/30/2018 0948   LABSPEC 1.004 (L) 12/30/2018 0948   PHURINE 8.0 12/30/2018 0948   GLUCOSEU NEGATIVE 12/30/2018  0948   HGBUR NEGATIVE 12/30/2018 0948   BILIRUBINUR NEGATIVE 12/30/2018 [place] 12/30/2018 0948   PROTEINUR NEGATIVE 12/30/2018 0948   NITRITE NEGATIVE 12/30/2018 0948   LEUKOCYTESUR NEGATIVE 12/30/2018 0948    STUDIES: Bone density and lab work results discussed   ELIGIBLE FOR AVAILABLE RESEARCH PROTOCOL: no  ASSESSMENT: 60 y.o. [place] woman   (1) status post right lumpectomy in 1997,  (a) status post adjuvant radiation  (b) did not receive antiestrogens or chemotherapy  (2) status post right breast upper outer quadrant biopsy 09/23/2018 for a clinical T2 N0, stage IIA invasive ductal carcinoma, grade 3, estrogen and progesterone receptor positive, HER-2 not amplified, with an MIB-1 of 70%  (3) Oncotype obtained from the initial biopsy  showed a score of 26 predicting a risk of recurrence outside the breast of 16/21% (depending on nodal status), if the patient's only systemic therapy is an antiestrogen for 5 years.  It also predicts a significant benefit from chemotherapy  (4) status post right mastectomy and sentinel lymph node sampling 10/22/2018 for a pT2 pN0, stage IB invasive ductal carcinoma, grade 2, with negative margins  (a) not planning reconstruction at this point  (5) adjuvant chemotherapy recommended on 11/05/2018 by Dr. Jana Hakim  (a) second opinion with Dr. Annabell Sabal at [facility] on 11/18/2018 recommended 4 cycles of adjuvant docetaxel and cyclophosphamide   (b) Third opinion with Dr. Adan Sis on 11/24/2018 recommended 4 cycles of adjuvant Docetaxel and Cyclophosphamide  (c) started adjuvant chemotherapy with docetaxel and cyclophosphamide on 12/23/2018, completed 4 cycles 02/24/2019  (6) anastrozole started 09/30/2018 (patient stopped due to concerns over bone loss--to restart once adjuvant therapy has completed)  (a) Bone density on 12/10/2018 shows a T score of -3.8 in the AP spine: Osteoporosis  (b) vitamin D level 03/11/2019 less than 10  (7)  genetics testing 10/06/2018 through the [facility] Common Hereditary Cancers Panel found no deleterious mutations in APC, ATM, AXIN2, BARD1, BMPR1A, BRCA1, BRCA2, BRIP1, CDH1, CDKN2A (p14ARF), CDKN2A (p16INK4a), CKD4, CHEK2, CTNNA1, DICER1, EPCAM (Deletion/duplication testing only), GREM1 (promoter region deletion/duplication testing only), KIT, MEN1, MLH1, MSH2, MSH3, MSH6, MUTYH, NBN, NF1, NHTL1, PALB2, PDGFRA, PMS2, POLD1, POLE, PTEN, RAD50, RAD51C, RAD51D, SDHB, SDHC, SDHD, SMAD4, SMARCA4. STK11, TP53, TSC1, TSC2, and VHL. The following genes were evaluated for sequence changes only: SDHA and HOXB13 c.251G>A variant only.   (a) 2 VUS's identified: VUS in APC c.-34014C>T and VHL c.340+272T>G identified    PLAN: Lavina will soon be 2 years out from definitive surgery for her right breast cancer.  There is no evidence of local recurrence.  This is favorable.  She is tolerating anastrozole well.  The plan is to continue that a total of 5 years.  She does have osteoporosis.  We discussed Prolia today again.  She really does not want to proceed with that.  She is starting vitamin D at 5000 units daily.  She is mildly anemic and she was found to be B12 deficient last November.  She is starting B12 injections today.  She will see me again in about 6 months and we will repeat her labs at that time.  From that point we will start follow-up on a once a year basis  I commended her exercise program and her general diet plan to decrease inflammation.  Total encounter time 25 minutes.   Virgie Dad. Leston Schueller, MD  06/22/2020 11:39 AM Medical Oncology and Hematology [facility] [place], [place] [address] Tel. [phone]    Fax. [phone]   I, Wilburn Mylar, am acting as scribe for Dr. Virgie Dad. Lamaria Hildebrandt.  I, Lurline Del MD, have reviewed the above documentation for accuracy and completeness, and I agree with the above.   *Total Encounter Time as defined by the  Centers for Medicare and Medicaid Services includes, in addition to the face-to-face time of a patient visit (documented in the note above) non-face-to-face time: obtaining and reviewing outside history, ordering and reviewing medications, tests or procedures, care coordination (communications with other health care professionals or caregivers) and documentation in the medical record.

## 2020-06-22 ENCOUNTER — Ambulatory Visit: Payer: BC Managed Care – PPO

## 2020-06-22 ENCOUNTER — Inpatient Hospital Stay: Payer: BC Managed Care – PPO | Admitting: Oncology

## 2020-06-22 ENCOUNTER — Inpatient Hospital Stay: Payer: BC Managed Care – PPO | Attending: Oncology

## 2020-06-22 ENCOUNTER — Other Ambulatory Visit: Payer: Self-pay

## 2020-06-22 ENCOUNTER — Inpatient Hospital Stay: Payer: BC Managed Care – PPO

## 2020-06-22 VITALS — BP 121/81 | HR 72 | Temp 97.6°F | Resp 18 | Ht 64.0 in | Wt 187.3 lb

## 2020-06-22 DIAGNOSIS — C50911 Malignant neoplasm of unspecified site of right female breast: Secondary | ICD-10-CM | POA: Diagnosis not present

## 2020-06-22 DIAGNOSIS — Z9221 Personal history of antineoplastic chemotherapy: Secondary | ICD-10-CM | POA: Diagnosis not present

## 2020-06-22 DIAGNOSIS — C50411 Malignant neoplasm of upper-outer quadrant of right female breast: Secondary | ICD-10-CM | POA: Diagnosis not present

## 2020-06-22 DIAGNOSIS — Z9011 Acquired absence of right breast and nipple: Secondary | ICD-10-CM | POA: Insufficient documentation

## 2020-06-22 DIAGNOSIS — Z170 Estrogen receptor positive status [ER+]: Secondary | ICD-10-CM | POA: Diagnosis not present

## 2020-06-22 DIAGNOSIS — E559 Vitamin D deficiency, unspecified: Secondary | ICD-10-CM | POA: Diagnosis not present

## 2020-06-22 DIAGNOSIS — Z780 Asymptomatic menopausal state: Secondary | ICD-10-CM | POA: Diagnosis not present

## 2020-06-22 DIAGNOSIS — Z79811 Long term (current) use of aromatase inhibitors: Secondary | ICD-10-CM | POA: Diagnosis not present

## 2020-06-22 DIAGNOSIS — M810 Age-related osteoporosis without current pathological fracture: Secondary | ICD-10-CM | POA: Insufficient documentation

## 2020-06-22 DIAGNOSIS — Z8041 Family history of malignant neoplasm of ovary: Secondary | ICD-10-CM | POA: Insufficient documentation

## 2020-06-22 DIAGNOSIS — Z923 Personal history of irradiation: Secondary | ICD-10-CM | POA: Insufficient documentation

## 2020-06-22 DIAGNOSIS — Z801 Family history of malignant neoplasm of trachea, bronchus and lung: Secondary | ICD-10-CM | POA: Diagnosis not present

## 2020-06-22 DIAGNOSIS — E538 Deficiency of other specified B group vitamins: Secondary | ICD-10-CM | POA: Diagnosis not present

## 2020-06-22 DIAGNOSIS — D539 Nutritional anemia, unspecified: Secondary | ICD-10-CM

## 2020-06-22 LAB — CBC WITH DIFFERENTIAL/PLATELET
Abs Immature Granulocytes: 0.01 10*3/uL (ref 0.00–0.07)
Basophils Absolute: 0 10*3/uL (ref 0.0–0.1)
Basophils Relative: 1 %
Eosinophils Absolute: 0.1 10*3/uL (ref 0.0–0.5)
Eosinophils Relative: 1 %
HCT: 34.6 % — ABNORMAL LOW (ref 36.0–46.0)
Hemoglobin: 11.4 g/dL — ABNORMAL LOW (ref 12.0–15.0)
Immature Granulocytes: 0 %
Lymphocytes Relative: 33 %
Lymphs Abs: 1.4 10*3/uL (ref 0.7–4.0)
MCH: 32.6 pg (ref 26.0–34.0)
MCHC: 32.9 g/dL (ref 30.0–36.0)
MCV: 98.9 fL (ref 80.0–100.0)
Monocytes Absolute: 0.3 10*3/uL (ref 0.1–1.0)
Monocytes Relative: 8 %
Neutro Abs: 2.5 10*3/uL (ref 1.7–7.7)
Neutrophils Relative %: 57 %
Platelets: 289 10*3/uL (ref 150–400)
RBC: 3.5 MIL/uL — ABNORMAL LOW (ref 3.87–5.11)
RDW: 12.2 % (ref 11.5–15.5)
WBC: 4.4 10*3/uL (ref 4.0–10.5)
nRBC: 0 % (ref 0.0–0.2)

## 2020-06-22 LAB — RETICULOCYTES
Immature Retic Fract: 7.4 % (ref 2.3–15.9)
RBC.: 3.49 MIL/uL — ABNORMAL LOW (ref 3.87–5.11)
Retic Count, Absolute: 44 10*3/uL (ref 19.0–186.0)
Retic Ct Pct: 1.3 % (ref 0.4–3.1)

## 2020-06-22 LAB — COMPREHENSIVE METABOLIC PANEL
ALT: 17 U/L (ref 0–44)
AST: 24 U/L (ref 15–41)
Albumin: 3.5 g/dL (ref 3.5–5.0)
Alkaline Phosphatase: 99 U/L (ref 38–126)
Anion gap: 6 (ref 5–15)
BUN: 5 mg/dL — ABNORMAL LOW (ref 6–20)
CO2: 27 mmol/L (ref 22–32)
Calcium: 9.1 mg/dL (ref 8.9–10.3)
Chloride: 106 mmol/L (ref 98–111)
Creatinine, Ser: 0.72 mg/dL (ref 0.44–1.00)
GFR, Estimated: >60 mL/min (ref >60)
Glucose, Bld: 81 mg/dL (ref 70–99)
Potassium: 4 mmol/L (ref 3.5–5.1)
Sodium: 139 mmol/L (ref 135–145)
Total Bilirubin: 0.4 mg/dL (ref 0.3–1.2)
Total Protein: 7 g/dL (ref 6.5–8.1)

## 2020-06-22 LAB — VITAMIN D 25 HYDROXY (VIT D DEFICIENCY, FRACTURES): Vit D, 25-Hydroxy: 36.77 ng/mL (ref 30–100)

## 2020-06-22 LAB — FERRITIN: Ferritin: 36 ng/mL (ref 11–307)

## 2020-06-22 LAB — VITAMIN B12: Vitamin B-12: 141 pg/mL — ABNORMAL LOW (ref 180–914)

## 2020-06-22 MED ORDER — CYANOCOBALAMIN 1000 MCG/ML IJ SOLN
1000.0000 ug | Freq: Once | INTRAMUSCULAR | Status: AC
  Administered 2020-06-22: 1000 ug via INTRAMUSCULAR

## 2020-06-22 MED ORDER — CYANOCOBALAMIN 1000 MCG/ML IJ SOLN
INTRAMUSCULAR | Status: AC
  Filled 2020-06-22: qty 1

## 2020-06-22 NOTE — Patient Instructions (Signed)

## 2020-06-23 ENCOUNTER — Telehealth: Payer: Self-pay | Admitting: Oncology

## 2020-06-23 ENCOUNTER — Encounter: Payer: Self-pay | Admitting: Oncology

## 2020-06-23 IMAGING — DX DG LUMBAR SPINE 2-3V
3 series · 3 of 3 positions shown · non-contrast
Comparison: None.

CLINICAL DATA: Low back pain for 2 months without known injury.

EXAM:
LUMBAR SPINE - 2-3 VIEW

[dg lumbar spine 2-3 views (1 of 3)]
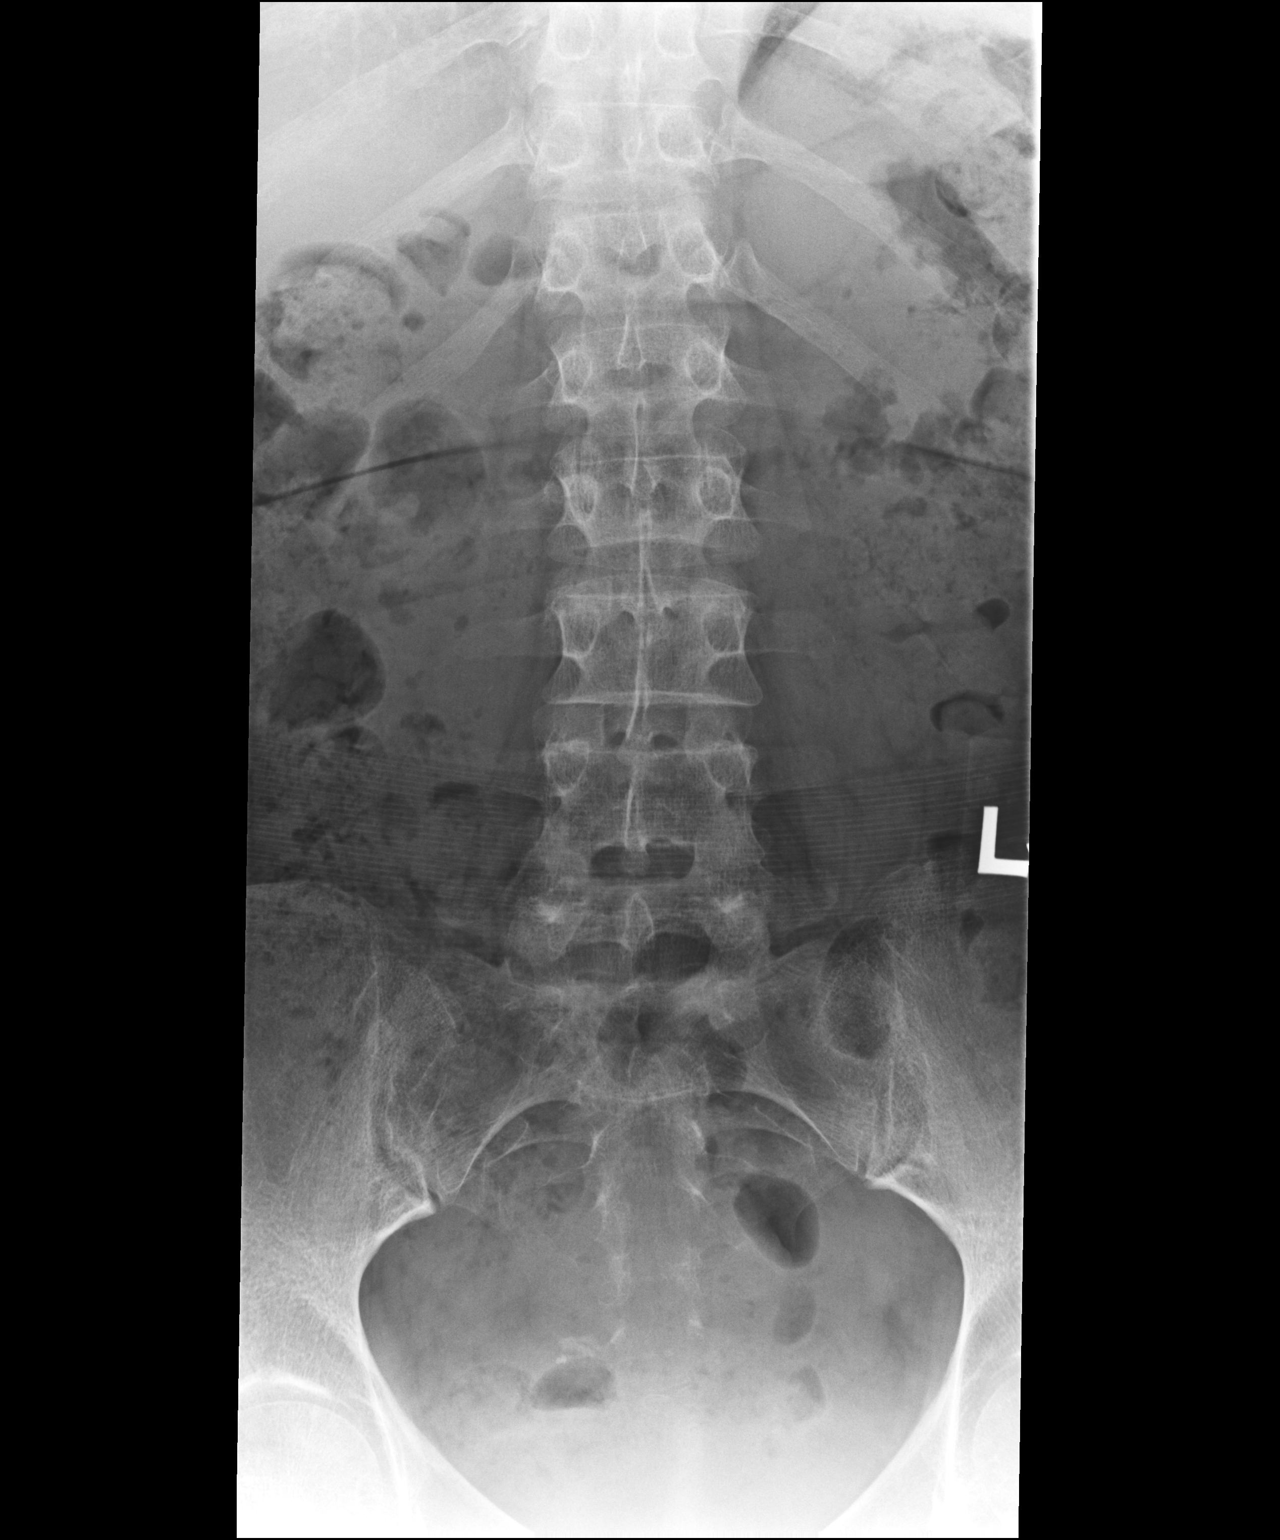

[dg lumbar spine 2-3 views (2 of 3)]
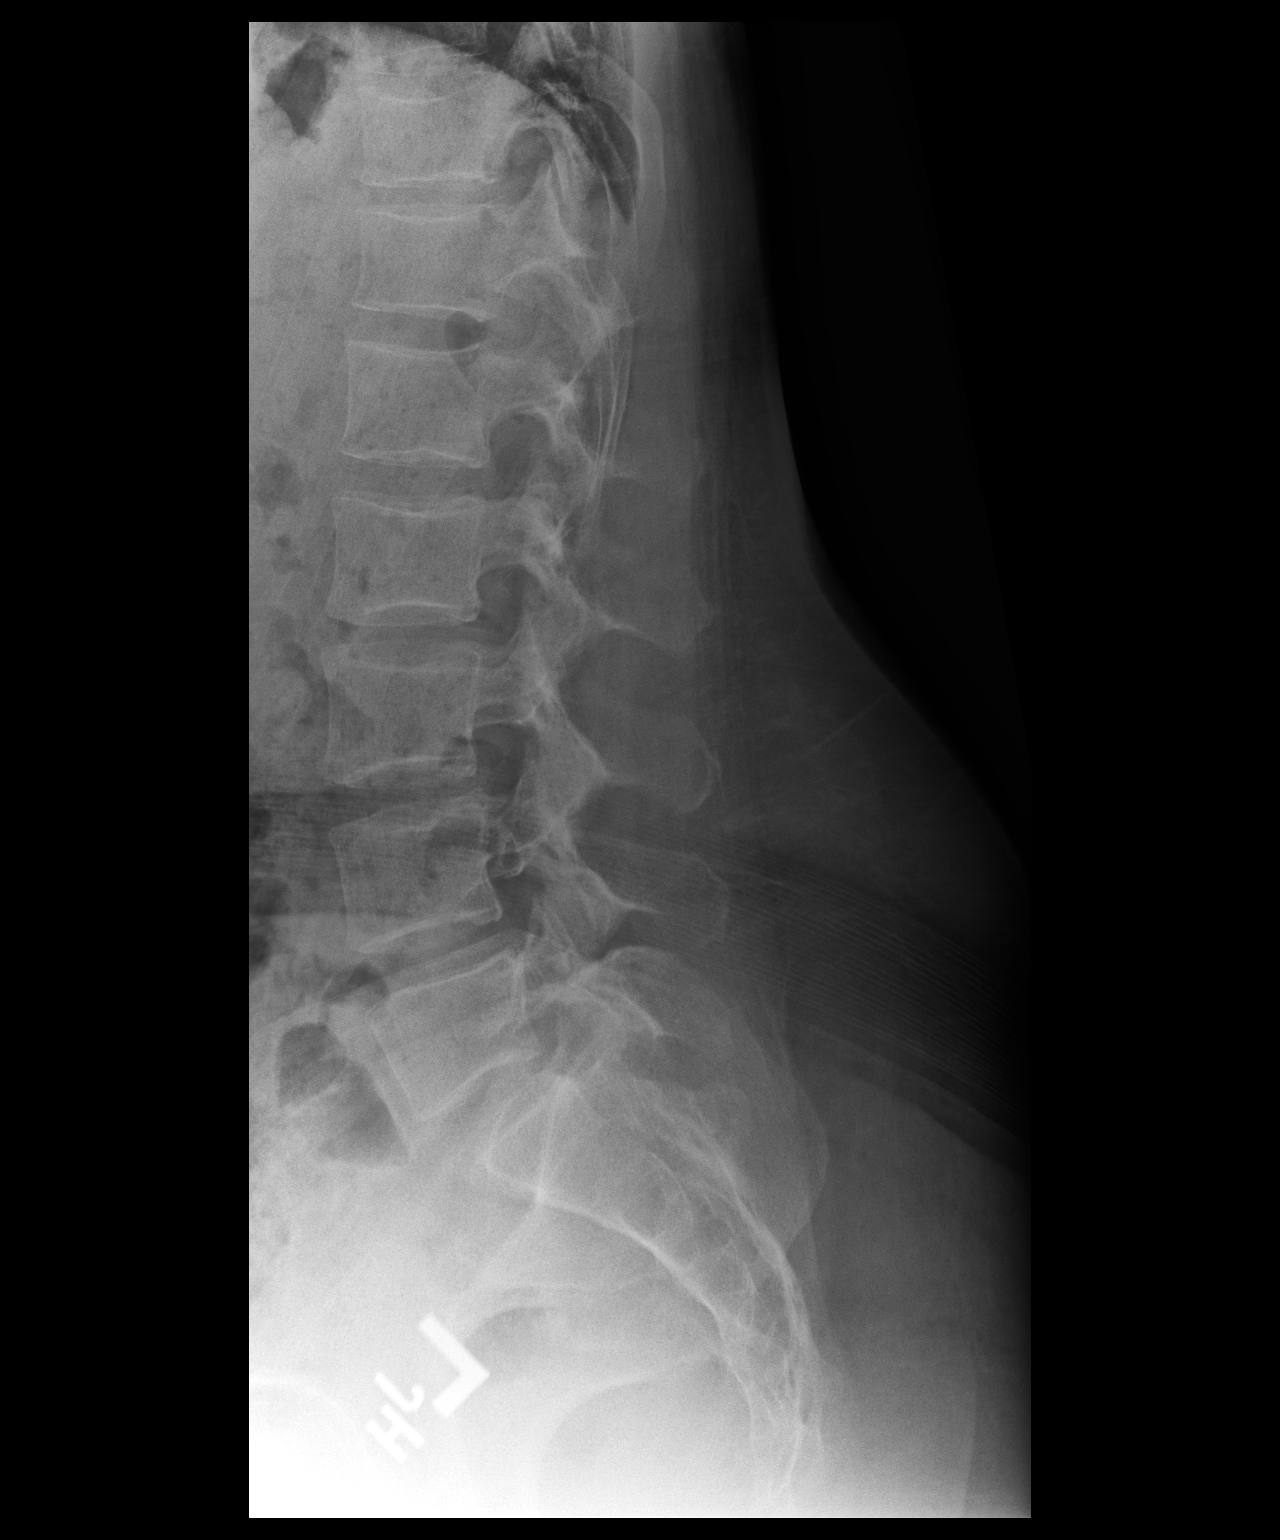

[dg lumbar spine 2-3 views (3 of 3)]
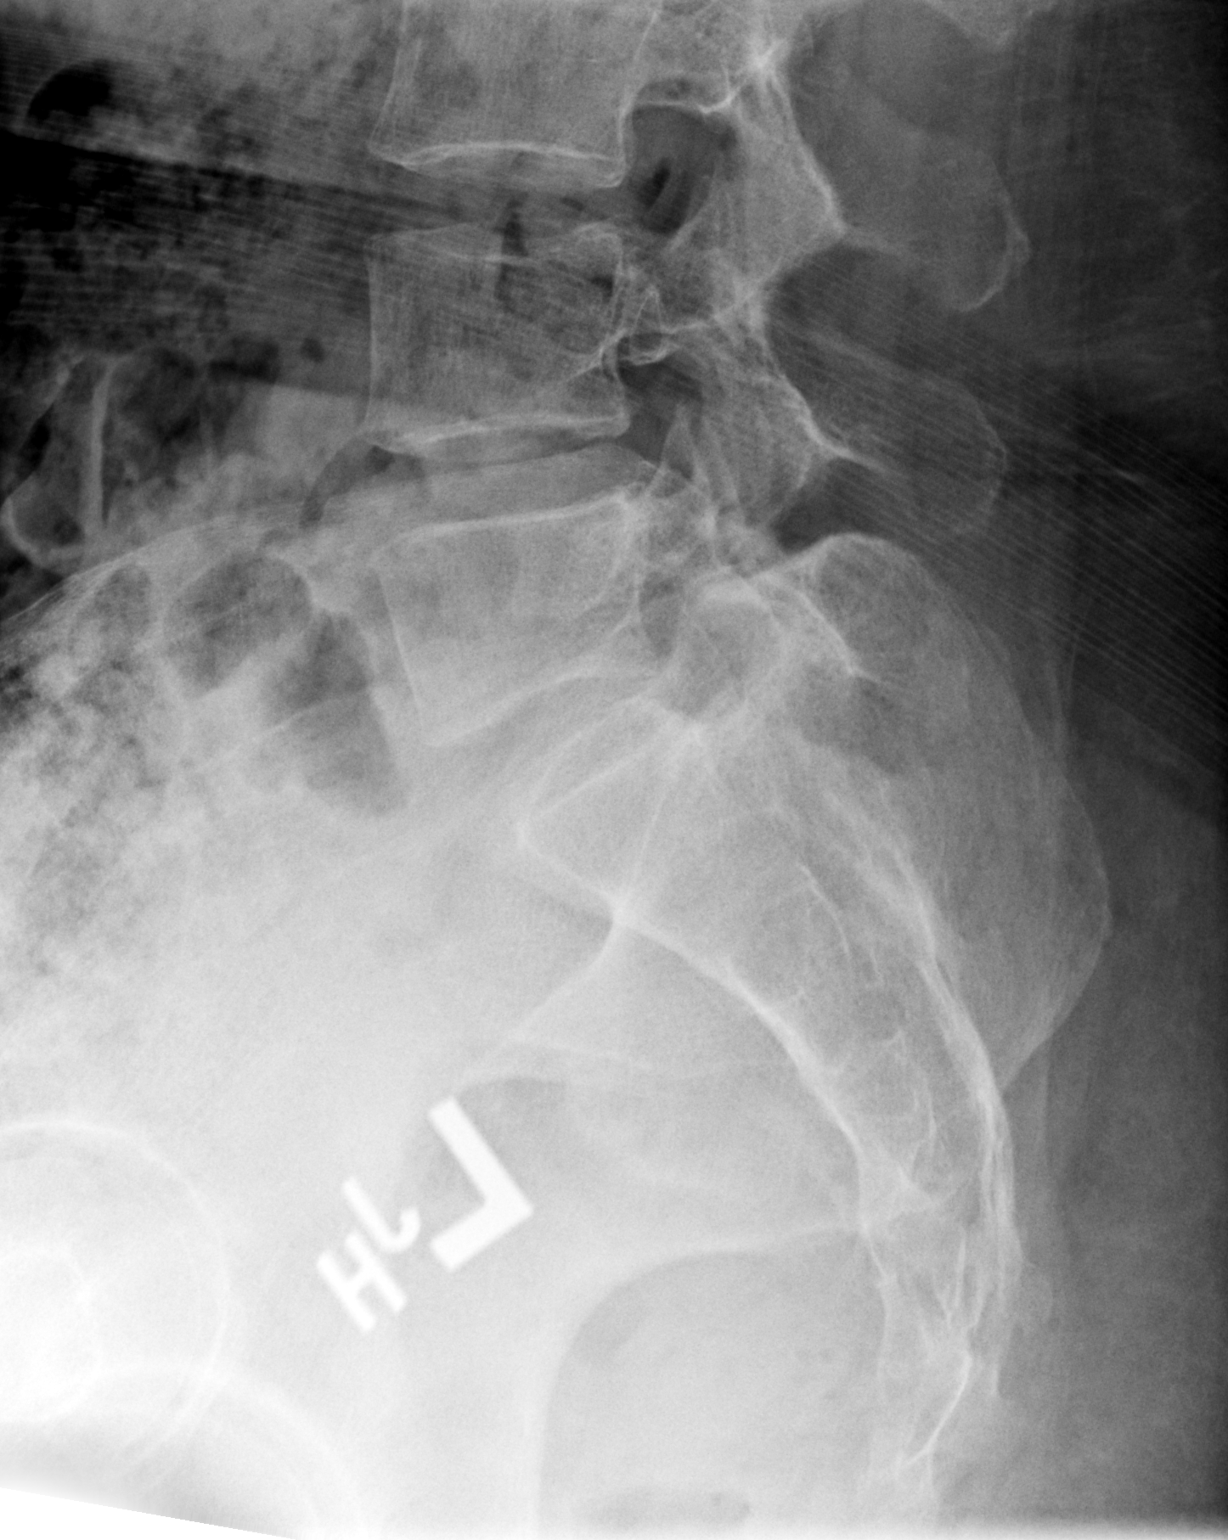

[3 of 3 positions shown; findings below may reference images not displayed]

FINDINGS: There is no evidence of lumbar spine fracture. Alignment is normal.
Intervertebral disc spaces are maintained.
IMPRESSION: Negative.

## 2020-06-23 NOTE — Telephone Encounter (Signed)
Scheduled appts per 2/24 los. Was not able to leave voicemail. Pt to refer to mychart for future appts.

## 2020-06-26 LAB — VITAMIN C: Vitamin C: 1.7 mg/dL (ref 0.4–2.0)

## 2020-07-04 ENCOUNTER — Telehealth: Payer: Self-pay

## 2020-07-04 NOTE — Telephone Encounter (Signed)
Returned call to pt regarding a question about her B12 injection. Pt stated her left arm felt slightly numb at the site this time and she has not experienced that before. Pt states her arm is not red, there is no bruising or edema. Encouraged pt to take tylenol or ibuprofen and use moist heat or ice. Encouraged pt to call back if it gets worse or changes. Pt verbalized understanding.

## 2020-07-05 ENCOUNTER — Ambulatory Visit
Admission: RE | Admit: 2020-07-05 | Discharge: 2020-07-05 | Disposition: A | Discharge status: Home / Self Care | Payer: BC Managed Care – PPO | Source: Ambulatory Visit | Attending: Family Medicine | Admitting: Family Medicine

## 2020-07-05 DIAGNOSIS — D3001 Benign neoplasm of right kidney: Secondary | ICD-10-CM | POA: Diagnosis not present

## 2020-07-05 DIAGNOSIS — D1771 Benign lipomatous neoplasm of kidney: Secondary | ICD-10-CM | POA: Diagnosis not present

## 2020-07-06 ENCOUNTER — Encounter: Payer: Self-pay | Admitting: Licensed Clinical Social Worker

## 2020-07-06 NOTE — Progress Notes (Signed)
UPDATE: VUS in  VHL c.340+272T>G has been reclassified to "Likely Benign." The report date is 07/05/2020.

## 2020-07-10 DIAGNOSIS — R102 Pelvic and perineal pain: Secondary | ICD-10-CM | POA: Diagnosis not present

## 2020-07-12 ENCOUNTER — Telehealth: Payer: Self-pay

## 2020-07-12 NOTE — Telephone Encounter (Signed)
Call attempt for requested return call. Unable to leave message as voicemail is full.

## 2020-07-20 ENCOUNTER — Inpatient Hospital Stay: Payer: BC Managed Care – PPO | Attending: Oncology

## 2020-08-10 ENCOUNTER — Telehealth: Payer: Self-pay

## 2020-08-14 ENCOUNTER — Telehealth: Payer: Self-pay | Admitting: Pharmacist

## 2020-08-14 NOTE — Telephone Encounter (Signed)
Received notification from [facility] regarding a prior authorization for [place]. Authorization has been APPROVED from 08/10/20 to 08/10/21.   Authorization # [id number]  Must fill through [place], PharmD, MPH Clinical Pharmacist (Rheumatology and Pulmonology)

## 2020-08-14 NOTE — Telephone Encounter (Signed)
error 

## 2020-08-16 DIAGNOSIS — N644 Mastodynia: Secondary | ICD-10-CM | POA: Diagnosis not present

## 2020-08-16 NOTE — Progress Notes (Deleted)
NEUROLOGY FOLLOW UP OFFICE NOTE  Alice [name] [id number]  Assessment/Plan:   1.  Left-sided headache 2.  Chiari 1 malformation.  Incidental finding 3.  ***  1.  ***  Subjective:  Alice [name] is a 60 year old female with osteoporosis, chronic constipation and right sided breast cancer (status post chemotherapy) who follows up for headache and neck pain.  UPDATE: Started nortriptyline in October. ***  Due to new onset headache, underwent MRI on 03/08/2020 personally reviewed.  MRI brain with and without contrast chronic nonspecific cerebral white matter changes and unchanged Chiari 1 malformation but no acute findings.  MRA of head was unremarkable.    Current NSAIDS:/steroids none Current analgesics:Extra-strength Tylenol Current triptans:none Current ergotamine:none Current anti-emetic:none Current muscle relaxants:none Current anti-anxiolytic:none Current sleep aide:none Current Antihypertensive medications:none Current Antidepressant medications:nortriptyline 10mg  QHS Current Anticonvulsant medications:none Current anti-CGRP:none Current Vitamins/Herbal/Supplements:none Current Antihistamines/Decongestants:none Other therapy:none Hormone/birth control:none  HISTORY: In September 2020, she started experiencing 6-7/10 aching and sharp pain in left posterior side of her neck. It lasts a few seconds and occurs 2 to 3 times a week. She reports tingling in the fingertips of both hands which is attributed to chemotherapy. However, she denies radicular pain, weakness or radicular numbness in the upper extremities. No diplopia or dysphagia. No preceding trauma. It it gets too bad, she takes an extra-strength Tylenol, which is effective.  MRI of brain without contrast from 08/19/2018 was unremarkable except mild chronic small vessel ischemic changes and for cerebellar tonsillar ectopia extending 7 mm below the foramen  magnum, consistent with a Chiari 1 malformation with no evidence of compression.  Neck pain subsequently subsided but in 2021 she reported new headaches, described as moderate sharp left temporal pain, rarely right side.  They are intermittent, lasting few seconds but several time throughout the day.  It occurs 2 to 3 days a week. No associated symptoms.  No longer having neck pain.  No longer having numbness and tingling in fingertips since finishing chemotherapy.  No triggers.  Sometimes takes Tylenol.  She rarely takes oxycodone.     PAST MEDICAL HISTORY: Past Medical History:  Diagnosis Date  . Anemia   . Breast cancer ([place])    right in 1997  . Family history of lung cancer   . Family history of ovarian cancer   . GERD (gastroesophageal reflux disease) 06/30/2018  . Herpes simplex type 2 infection 02/2006  . History of breast cancer 1997  . History of esophagogastroduodenoscopy (EGD) 09/15/2015   small hiatal hernia was noted otherwise all was normal  . Osteoporosis   . Personal history of breast cancer     MEDICATIONS: Current Outpatient Medications on File Prior to Visit  Medication Sig Dispense Refill  . anastrozole (ARIMIDEX) 1 MG tablet Take 1 tablet (1 mg total) by mouth daily. 90 tablet 4  . Vitamin D, Ergocalciferol, (DRISDOL) 1.25 MG (50000 UNIT) CAPS capsule Take 1 capsule (50,000 Units total) by mouth 2 (two) times a week. (Patient taking differently: Take 50,000 Units by mouth every 7 (seven) days. ) 24 capsule 0   No current facility-administered medications on file prior to visit.    ALLERGIES: No Known Allergies  FAMILY HISTORY: Family History  Problem Relation Age of Onset  . Ovarian cancer Sister 35  . Dementia Mother   . Aneurysm Father       Objective:  *** General: No acute distress.  Patient appears ***-groomed.   Head:  Normocephalic/atraumatic Eyes:  Fundi examined but not visualized Neck:  supple, no paraspinal tenderness, full range of  motion Heart:  Regular rate and rhythm Lungs:  Clear to auscultation bilaterally Back: No paraspinal tenderness Neurological Exam: alert and oriented to person, place, and time. Attention span and concentration intact, recent and remote memory intact, fund of knowledge intact.  Speech fluent and not dysarthric, language intact.  CN II-XII intact. Bulk and tone normal, muscle strength 5/5 throughout.  Sensation to light touch, temperature and vibration intact.  Deep tendon reflexes 2+ throughout, toes downgoing.  Finger to nose and heel to shin testing intact.  Gait normal, Romberg negative.     Metta Clines, DO  CC: [name], MD

## 2020-08-17 ENCOUNTER — Inpatient Hospital Stay: Payer: BC Managed Care – PPO | Attending: Oncology

## 2020-08-17 ENCOUNTER — Ambulatory Visit: Payer: BC Managed Care – PPO | Admitting: Neurology

## 2020-08-17 NOTE — Telephone Encounter (Signed)
ATC patient to discuss Prolia. Mailbox is full. Will f/u

## 2020-08-18 NOTE — Telephone Encounter (Signed)
ATC # 2 patient to discuss Prolia. Mailbox is full. Will f/u

## 2020-08-19 DIAGNOSIS — M25562 Pain in left knee: Secondary | ICD-10-CM | POA: Diagnosis not present

## 2020-08-19 DIAGNOSIS — M25561 Pain in right knee: Secondary | ICD-10-CM | POA: Diagnosis not present

## 2020-08-21 NOTE — Telephone Encounter (Signed)
Pt called and LVM stating she needs a call back but left no details. This LPN attempted to return patient's call but no answer and VM box was full.

## 2020-08-22 NOTE — Progress Notes (Deleted)
Office Visit Note  Patient: Alice [name]             Date of Birth: [birth year]           MRN: [id number]             PCP: [name], MD Referring: [name], MD Visit Date: 09/05/2020 Occupation: @GUAROCC @  Subjective:  No chief complaint on file.   History of Present Illness: Alice [name] is a 60 y.o. female ***   Activities of Daily Living:  Patient reports morning stiffness for *** {minute/hour:19697}.   Patient {ACTIONS;DENIES/REPORTS:21021675::"Denies"} nocturnal pain.  Difficulty dressing/grooming: {ACTIONS;DENIES/REPORTS:21021675::"Denies"} Difficulty climbing stairs: {ACTIONS;DENIES/REPORTS:21021675::"Denies"} Difficulty getting out of chair: {ACTIONS;DENIES/REPORTS:21021675::"Denies"} Difficulty using hands for taps, buttons, cutlery, and/or writing: {ACTIONS;DENIES/REPORTS:21021675::"Denies"}  No Rheumatology ROS completed.   PMFS History:  Patient Active Problem List   Diagnosis Date Noted  . Anemia, macrocytic 03/09/2020  . Acute midline low back pain without sciatica 01/15/2019  . Genetic testing 10/06/2018  . Family history of ovarian cancer   . Family history of lung cancer   . Personal history of breast cancer   . Malignant neoplasm of upper-outer quadrant of right breast in female, estrogen receptor positive ([place]) 09/29/2018    Past Medical History:  Diagnosis Date  . Anemia   . Breast cancer ([place])    right in 1997  . Family history of lung cancer   . Family history of ovarian cancer   . GERD (gastroesophageal reflux disease) 06/30/2018  . Herpes simplex type 2 infection 02/2006  . History of breast cancer 1997  . History of esophagogastroduodenoscopy (EGD) 09/15/2015   small hiatal hernia was noted otherwise all was normal  . Osteoporosis   . Personal history of breast cancer     Family History  Problem Relation Age of Onset  . Ovarian cancer Sister 91  . Dementia Mother   . Aneurysm Father    Past Surgical  History:  Procedure Laterality Date  . BREAST ENHANCEMENT SURGERY     s/p cancer and surgery, right  . BREAST LUMPECTOMY     right due to breast cancer  . MASTECTOMY W/ SENTINEL NODE BIOPSY Right 10/22/2018   Procedure: RIGHT MASTECTOMY WITH RIGHT AXILLARY SENTINEL LYMPH NODE BIOPSY;  Surgeon: Rolm Bookbinder, MD;  Location: [facility];  Service: General;  Laterality: Right;   Social History   Social History Narrative   Married lives with spouse   No children   Some college   Right handed   Does drink tea 1-2 a week green tea, no soda, no coffee    There is no immunization history on file for this patient.   Objective: Vital Signs: There were no vitals taken for this visit.   Physical Exam   Musculoskeletal Exam: ***  CDAI Exam: CDAI Score: -- Patient Global: --; Provider Global: -- Swollen: --; Tender: -- Joint Exam 09/05/2020   No joint exam has been documented for this visit   There is currently no information documented on the homunculus. Go to the Rheumatology activity and complete the homunculus joint exam.  Investigation: No additional findings.  Imaging: No results found.  Recent Labs: Lab Results  Component Value Date   WBC 4.4 06/22/2020   HGB 11.4 (L) 06/22/2020   PLT 289 06/22/2020   NA 139 06/22/2020   K 4.0 06/22/2020   CL 106 06/22/2020   CO2 27 06/22/2020   GLUCOSE 81 06/22/2020   BUN 5 (L) 06/22/2020   CREATININE 0.72 06/22/2020  BILITOT 0.4 06/22/2020   ALKPHOS 99 06/22/2020   AST 24 06/22/2020   ALT 17 06/22/2020   PROT 7.0 06/22/2020   ALBUMIN 3.5 06/22/2020   CALCIUM 9.1 06/22/2020   GFRAA 102 03/08/2020    Speciality Comments: Start Prolia once Vitamin D defeciency has bee corrected.   Procedures:  No procedures performed Allergies: Patient has no known allergies.   Assessment / Plan:     Visit Diagnoses: No diagnosis found.  Orders: No orders of the defined types were placed in this encounter.  No  orders of the defined types were placed in this encounter.   Face-to-face time spent with patient was *** minutes. Greater than 50% of time was spent in counseling and coordination of care.  Follow-Up Instructions: No follow-ups on file.   Earnestine Mealing, CMA  Note - This record has been created using Editor, commissioning.  Chart creation errors have been sought, but may not always  have been located. Such creation errors do not reflect on  the standard of medical care.

## 2020-08-24 ENCOUNTER — Telehealth: Payer: Self-pay

## 2020-08-24 DIAGNOSIS — M25562 Pain in left knee: Secondary | ICD-10-CM | POA: Diagnosis not present

## 2020-08-24 NOTE — Telephone Encounter (Signed)
Returned call to pt. Pt requesting to have B-12 drawn when she comes in for B-12 injection in May. Will let provider know.

## 2020-08-24 NOTE — Telephone Encounter (Signed)
Spoke with patient about Prolia. She would like to further discuss at appt on 09/05/20. We discussed that weight-bearing exercises in addition to appropriate calcium/vitamin D intake are important while starting Prolia as well. Discussed most prevalent side effects including holding for dental procedures, muscle/bone pain, injection site reaction. She remains hesitant d/t her history of breast cancer.  Discussed that we monitor labs 2 weeks before each Prolia injection and ensure it is appropriate to continue every 6 months. She will need labs at the [facility] on 09/05/20 if she confirms that she'd like to move forward with Prolia officially  Knox Saliva, PharmD, MPH Clinical Pharmacist (Rheumatology and Pulmonology)

## 2020-08-28 ENCOUNTER — Other Ambulatory Visit: Payer: Self-pay | Admitting: Oncology

## 2020-08-28 ENCOUNTER — Encounter: Payer: Self-pay | Admitting: Oncology

## 2020-08-29 NOTE — Progress Notes (Signed)
RN successfully faxed MD letter to patient's employer [phone].  Pt aware.

## 2020-09-05 ENCOUNTER — Ambulatory Visit: Payer: BC Managed Care – PPO | Admitting: Rheumatology

## 2020-09-05 DIAGNOSIS — C50411 Malignant neoplasm of upper-outer quadrant of right female breast: Secondary | ICD-10-CM

## 2020-09-05 DIAGNOSIS — G8929 Other chronic pain: Secondary | ICD-10-CM

## 2020-09-05 DIAGNOSIS — E559 Vitamin D deficiency, unspecified: Secondary | ICD-10-CM

## 2020-09-05 DIAGNOSIS — Z8041 Family history of malignant neoplasm of ovary: Secondary | ICD-10-CM

## 2020-09-05 DIAGNOSIS — M818 Other osteoporosis without current pathological fracture: Secondary | ICD-10-CM

## 2020-09-05 DIAGNOSIS — Z801 Family history of malignant neoplasm of trachea, bronchus and lung: Secondary | ICD-10-CM

## 2020-09-14 ENCOUNTER — Other Ambulatory Visit: Payer: Self-pay

## 2020-09-14 ENCOUNTER — Inpatient Hospital Stay: Payer: BC Managed Care – PPO | Attending: Oncology

## 2020-09-14 VITALS — BP 128/80 | HR 76 | Resp 18

## 2020-09-14 DIAGNOSIS — E538 Deficiency of other specified B group vitamins: Secondary | ICD-10-CM | POA: Diagnosis not present

## 2020-09-14 DIAGNOSIS — C50411 Malignant neoplasm of upper-outer quadrant of right female breast: Secondary | ICD-10-CM

## 2020-09-14 DIAGNOSIS — Z170 Estrogen receptor positive status [ER+]: Secondary | ICD-10-CM

## 2020-09-14 MED ORDER — CYANOCOBALAMIN 1000 MCG/ML IJ SOLN
INTRAMUSCULAR | Status: AC
  Filled 2020-09-14: qty 1

## 2020-09-14 MED ORDER — CYANOCOBALAMIN 1000 MCG/ML IJ SOLN
1000.0000 ug | Freq: Once | INTRAMUSCULAR | Status: AC
  Administered 2020-09-14: 1000 ug via INTRAMUSCULAR

## 2020-09-14 NOTE — Patient Instructions (Signed)

## 2020-09-19 DIAGNOSIS — M25562 Pain in left knee: Secondary | ICD-10-CM | POA: Diagnosis not present

## 2020-10-10 DIAGNOSIS — K219 Gastro-esophageal reflux disease without esophagitis: Secondary | ICD-10-CM | POA: Diagnosis not present

## 2020-10-10 DIAGNOSIS — K5909 Other constipation: Secondary | ICD-10-CM | POA: Diagnosis not present

## 2020-10-12 ENCOUNTER — Telehealth: Payer: Self-pay | Admitting: *Deleted

## 2020-10-12 ENCOUNTER — Inpatient Hospital Stay: Payer: BC Managed Care – PPO

## 2020-10-12 ENCOUNTER — Telehealth: Payer: Self-pay | Admitting: Oncology

## 2020-10-12 NOTE — Telephone Encounter (Signed)
VM left on nurse line that patient unable to come for visit today and needs to reschedule.  Return call number given as [phone].  Above message sent to scheduling per H[facility] priority.

## 2020-10-12 NOTE — Telephone Encounter (Signed)
Sheduled appointment per 06/16 sch msg. Left message.

## 2020-10-13 ENCOUNTER — Other Ambulatory Visit: Payer: Self-pay | Admitting: Oncology

## 2020-10-15 ENCOUNTER — Encounter: Payer: Self-pay | Admitting: Adult Health

## 2020-10-16 ENCOUNTER — Ambulatory Visit: Payer: BC Managed Care – PPO

## 2020-10-20 DIAGNOSIS — M2242 Chondromalacia patellae, left knee: Secondary | ICD-10-CM | POA: Diagnosis not present

## 2020-10-25 DIAGNOSIS — M1712 Unilateral primary osteoarthritis, left knee: Secondary | ICD-10-CM | POA: Diagnosis not present

## 2020-10-25 DIAGNOSIS — M25562 Pain in left knee: Secondary | ICD-10-CM | POA: Diagnosis not present

## 2020-11-09 ENCOUNTER — Ambulatory Visit: Payer: BC Managed Care – PPO

## 2020-11-09 DIAGNOSIS — R1084 Generalized abdominal pain: Secondary | ICD-10-CM | POA: Diagnosis not present

## 2020-11-23 ENCOUNTER — Ambulatory Visit: Payer: BC Managed Care – PPO

## 2020-11-23 ENCOUNTER — Telehealth: Payer: Self-pay

## 2020-12-06 NOTE — Progress Notes (Addendum)
[place]  [phone] [phone]    ID: Alice [name] DOB: Dec 15, 1960  MR#: [id number]  WCB#:[id number]  Patient Care Team: [name], MD as PCP - General (Family Medicine) Mauro Kaufmann, RN as Oncology Nurse Navigator Rockwell Germany, RN as Oncology Nurse Navigator Anginette Espejo, Virgie Dad, MD as Consulting Physician (Oncology) Kyung Rudd, MD as Consulting Physician (Radiation Oncology) Rolm Bookbinder, MD as Consulting Physician (General Surgery) Earnstine Regal, PA-C as Consulting Physician (Obstetrics and Gynecology) Juanita Craver, MD as Consulting Physician (Gastroenterology) [name], MD as Consulting Physician (Family Medicine) Cristine Polio, MD as Consulting Physician (Plastic Surgery) Chauncey Cruel, MD OTHER MD:  CHIEF COMPLAINT: Estrogen receptor positive breast cancer (s/p right mastectomy)  CURRENT TREATMENT: Anastrozole; vit B-12 parenterally   INTERVAL HISTORY: Alice [name] was seen today for follow up of her estrogen receptor positive breast cancer.  She restarted anastrozole on 04/06/2019.  She tolerates that generally well, but is concerned because of the osteoporosis problem.  Her most recent bone density screening, performed on 12/10/2018, showed a T-score of -3.8, which is considered osteoporotic.  She is on vitamin D supplementation and we have a repeat level due today  Since her last visit, she presented with intermittent left breast pain. She underwent left diagnostic mammography and left breast ultrasonography on 08/16/2020 at [facility] showing: breast density category A; no evidence of malignancy.  She receives monthly B12 injections here, with no complications.  However she says she got a bill for several $100 for this.  She is going to switch to "Gummies" which she is obtaining from [employer].   REVIEW OF SYSTEMS: Alice [name] had a cyst removed from her back by Dr. Donne Hazel 10/28/2019.  There was no  dysplasia or malignancy ([id number]).  She is walking about 30 to 45 minutes most days except Sundays.  She is now working from home.  She is on a mostly vegetarian diet and feels healthier as a result.  Her husband also has lost about 15 pounds although he still likes chicken wings too much she says.  A detailed review of systems today was otherwise stable   COVID 19 VACCINATION STATUS: Refuses vaccination; has not had COVID as of August 2022   HISTORY OF CURRENT ILLNESS: From the original intake note:  Alice [name] has a history of remote right breast cancer in 1997, treated with right lumpectomy and radiation in [place].  The patient did not receive chemotherapy or antiestrogen treatments.  More recently she presented for her annual mammogram with a non-tender area of firmness in the right breast at 9 o'clock for 2 months. She underwent bilateral diagnostic mammography with tomography and right breast ultrasonography at [facility] on 09/08/2018 showing: breast density category A; scattered-appearing fibroglandular tissue at the lumpectomy site in the upper outer right breast middle depth spanning 3.4 cm, with indistinct margins, was not seen on prior mammogram from 2018.  There was also a keloid scar in the inferior medial left breast.  On physical exam there was a rectangular area of hardness measuring up to 7 cm in the right breast at 9:00, corresponding with the prior lumpectomy and consistent with radiation change.  Ultrasound of this area showed only postlumpectomy change, with no suspicious or discrete mass.  Ultrasound of the axilla was benign  Accordingly on 09/23/2018 she proceeded to biopsy of the right breast area in question. The pathology from this procedure SAA 20-07/02/2000 showed: invasive ductal carcinoma, E-cadherin positive,  grade 3,  Prognostic indicators significant for:  estrogen receptor at 100% positive and progesterone receptor, 30% positive, both with strong  staining intensity proliferation marker Ki67 at 70%. HER2 negative by immunohistochemistry (0).  The patient's subsequent history is as detailed below.   PAST MEDICAL HISTORY: Past Medical History:  Diagnosis Date   Anemia    Breast cancer (HCC)    right in 1997   Family history of lung cancer    Family history of ovarian cancer    GERD (gastroesophageal reflux disease) 06/30/2018   Herpes simplex type 2 infection 02/2006   History of breast cancer 1997   History of esophagogastroduodenoscopy (EGD) 09/15/2015   small hiatal hernia was noted otherwise all was normal   Osteoporosis    Personal history of breast cancer     PAST SURGICAL HISTORY: Past Surgical History:  Procedure Laterality Date   BREAST ENHANCEMENT SURGERY     s/p cancer and surgery, right   BREAST LUMPECTOMY     right due to breast cancer   MASTECTOMY W/ SENTINEL NODE BIOPSY Right 10/22/2018   Procedure: RIGHT MASTECTOMY WITH RIGHT AXILLARY SENTINEL LYMPH NODE BIOPSY;  Surgeon: Emelia Loron, MD;  Location: [facility];  Service: General;  Laterality: Right;  wisdom teeth removal   FAMILY HISTORY: Family History  Problem Relation Age of Onset   Ovarian cancer Sister 52   Dementia Mother    Aneurysm Father   Patient's father was 40 years old when he died from aneurysm. Patient's mother died from dementia at age 58. Her sister was diagnosed with ovarian cancer at age 27, and this subsequently took her life.  In total of the patient has 5 brothers and 2 sisters.  She is not aware of other cancers in the family   GYNECOLOGIC HISTORY:  No LMP recorded. Patient is postmenopausal. Menarche: 60 years old GX P 0 LMP age 24 Contraceptive none HRT none  Hysterectomy? no BSO? 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 She is married. Husband Stark Jock is from [place], so his native language is [place]. She lives at home with  her husband. They have no children. She attends a [employer] in [place].    ADVANCED DIRECTIVES: In the absence of documents to the contrary her husband is automatically her HCPOA.   HEALTH MAINTENANCE: Social History   Tobacco Use   Smoking status: Never   Smokeless tobacco: Never  Vaping Use   Vaping Use: Never used  Substance Use Topics   Alcohol use: No    Alcohol/week: 0.0 standard drinks   Drug use: No     Colonoscopy: endoscopy in 2017, colonoscopy due 2020 but delayed due to virus  PAP: 12/03/2017, normal  Bone density: 11/2018, -3.8   No Known Allergies  Current Outpatient Medications  Medication Sig Dispense Refill   anastrozole (ARIMIDEX) 1 MG tablet TAKE 1 TABLET(1 MG) BY MOUTH DAILY 90 tablet 4   Vitamin D, Ergocalciferol, (DRISDOL) 1.25 MG (50000 UNIT) CAPS capsule Take 1 capsule (50,000 Units total) by mouth 2 (two) times a week. (Patient taking differently: Take 50,000 Units by mouth every 7 (seven) days. ) 24 capsule 0   No current facility-administered medications for this visit.    OBJECTIVE: African-American [name] in no acute distress  Vitals:   12/07/20 1118  BP: 120/69  Pulse: 79  Temp: 98.1 F (36.7 C)  Resp: 16  Height: 5\' 4"  (1.626 m)  Weight: 192 lb (87.1 kg)  SpO2: 99%  TempSrc: Temporal  BMI (Calculated): 32.94   Wt Readings from Last 3 Encounters:  12/07/20 192 lb (87.1 kg)  06/22/20 187 lb 4.8 oz (85 kg)  04/05/20 179 lb 12.8 oz (81.6 kg)    Filed Weights   12/07/20 1118  Weight: 192 lb (87.1 kg)   ECOG: 1  Sclerae unicteric, EOMs intact Wearing a mask No cervical or supraclavicular adenopathy Lungs no rales or rhonchi Heart regular rate and rhythm Abd soft, nontender, positive bowel sounds MSK no focal spinal tenderness, no upper extremity lymphedema Neuro: nonfocal, well oriented, appropriate affect Breasts: Status post right mastectomy.  There is no evidence of chest wall recurrence.  Left breast is status post  reduction mammoplasty and otherwise benign.  Both axillae are benign.   LAB RESULTS:  CMP     Component Value Date/Time   NA 139 06/22/2020 1043   K 4.0 06/22/2020 1043   CL 106 06/22/2020 1043   CO2 27 06/22/2020 1043   GLUCOSE 81 06/22/2020 1043   BUN 5 (L) 06/22/2020 1043   CREATININE 0.72 06/22/2020 1043   CREATININE 0.75 03/08/2020 1544   CALCIUM 9.1 06/22/2020 1043   PROT 7.0 06/22/2020 1043   ALBUMIN 3.5 06/22/2020 1043   AST 24 06/22/2020 1043   AST 49 (H) 02/10/2019 1229   ALT 17 06/22/2020 1043   ALT 23 02/10/2019 1229   ALKPHOS 99 06/22/2020 1043   BILITOT 0.4 06/22/2020 1043   BILITOT 0.4 02/10/2019 1229   GFRNONAA >60 06/22/2020 1043   GFRNONAA 88 03/08/2020 1544   GFRAA 102 03/08/2020 1544    No results found for: TOTALPROTELP, ALBUMINELP, A1GS, A2GS, BETS, BETA2SER, GAMS, MSPIKE, SPEI  No results found for: KPAFRELGTCHN, LAMBDASER, KAPLAMBRATIO  Lab Results  Component Value Date   WBC 5.6 12/07/2020   NEUTROABS 3.4 12/07/2020   HGB 11.7 (L) 12/07/2020   HCT 34.1 (L) 12/07/2020   MCV 93.4 12/07/2020   PLT 286 12/07/2020   No results found for: LABCA2  No components found for: SLHTDS287  No results for input(s): INR in the last 168 hours.  No results found for: LABCA2  No results found for: GOT157  No results found for: WIO035  No results found for: DHR416  No results found for: CA2729  No components found for: HGQUANT  No results found for: CEA1 / No results found for: CEA1  No results found for: AFPTUMOR  No results found for: CHROMOGRNA  No results found for: HGBA, HGBA2QUANT, HGBFQUANT, HGBSQUAN (Hemoglobinopathy evaluation)   No results found for: LDH  Lab Results  Component Value Date   IRON 96 03/13/2020   TIBC 324 03/13/2020   IRONPCTSAT 29 03/13/2020   (Iron and TIBC)  Lab Results  Component Value Date   FERRITIN 36 06/22/2020    Urinalysis    Component Value Date/Time   COLORURINE YELLOW 12/30/2018 0948    APPEARANCEUR CLEAR 12/30/2018 0948   LABSPEC 1.004 (L) 12/30/2018 0948   PHURINE 8.0 12/30/2018 [place] 12/30/2018 [place] 12/30/2018 [place] 12/30/2018 [place] 12/30/2018 0948   PROTEINUR NEGATIVE 12/30/2018 0948   NITRITE NEGATIVE 12/30/2018 0948   LEUKOCYTESUR NEGATIVE 12/30/2018 0948    STUDIES: No results found.    ELIGIBLE FOR AVAILABLE RESEARCH PROTOCOL: no  ASSESSMENT: 60 y.o. Alice [name]   (1) status post right lumpectomy in 1997,  (a) status post adjuvant radiation  (b) did not receive antiestrogens or  chemotherapy  (2) status post right breast upper outer quadrant biopsy 09/23/2018 for a clinical T2 N0, stage IIA invasive ductal carcinoma, grade 3, estrogen and progesterone receptor positive, HER-2 not amplified, with an MIB-1 of 70%  (3) Oncotype obtained from the initial biopsy showed a score of 26 predicting a risk of recurrence outside the breast of 16/21% (depending on nodal status), if the patient's only systemic therapy is an antiestrogen for 5 years.  It also predicts a significant benefit from chemotherapy  (4) status post right mastectomy and sentinel lymph node sampling 10/22/2018 for a pT2 pN0, stage IB invasive ductal carcinoma, grade 2, with negative margins  (a) not planning reconstruction at this point  (5) adjuvant chemotherapy recommended on 11/05/2018 by Dr. Jana Hakim  (a) second opinion with Dr. Annabell Sabal at [facility] on 11/18/2018 recommended 4 cycles of adjuvant docetaxel and cyclophosphamide   (b) Third opinion with Dr. Adan Sis on 11/24/2018 recommended 4 cycles of adjuvant Docetaxel and Cyclophosphamide  (c) started adjuvant chemotherapy with docetaxel and cyclophosphamide on 12/23/2018, completed 4 cycles 02/24/2019  (6) anastrozole started 09/30/2018 (patient stopped due to concerns over bone loss--to restart once adjuvant therapy has completed)  (a) Bone density on 12/10/2018  shows a T score of -3.8 in the AP spine: Osteoporosis  (b) vitamin D level 03/11/2019 less than 10  (7) genetics testing 10/06/2018 through the [facility] Common Hereditary Cancers Panel found no deleterious mutations in APC, ATM, AXIN2, BARD1, BMPR1A, BRCA1, BRCA2, BRIP1, CDH1, CDKN2A (p14ARF), CDKN2A (p16INK4a), CKD4, CHEK2, CTNNA1, DICER1, EPCAM (Deletion/duplication testing only), GREM1 (promoter region deletion/duplication testing only), KIT, MEN1, MLH1, MSH2, MSH3, MSH6, MUTYH, NBN, NF1, NHTL1, PALB2, PDGFRA, PMS2, POLD1, POLE, PTEN, RAD50, RAD51C, RAD51D, SDHB, SDHC, SDHD, SMAD4, SMARCA4. STK11, TP53, TSC1, TSC2, and VHL. The following genes were evaluated for sequence changes only: SDHA and HOXB13 c.251G>A variant only.   (a) 2 VUS's identified: VUS in APC c.-34014C>T and VHL c.340+272T>G identified    PLAN: Alice [name] is just a little over 2 years out from definitive surgery for her breast cancer with no evidence of disease recurrence.  This is very favorable.  We discussed the discomfort she was experiencing in the left breast.  She understands that it is very common to have soreness and sensitivity and even shooting pains whenever she has surgery and of course she had reduction mammoplasty there.  She was reassured that that breast is fine and will have mammography again in a year.  She is going to try oral B12 and I have no problem with that.  She will need to have a repeat B12 [name] and we can do that in 6 months when she returns.  We are checking a vitamin D level today adjust to make sure it is not too high.  She also has an excellent walking program.  When she has her repeat mammogram April of next year it would be a good idea to get a repeat bone density  She knows to call for any other issue that may develop before her next visit which will be in about 6 months  Total encounter time 25 minutes.Alice Jews C. Tayvian Holycross, MD  12/07/2020 11:21 AM Medical Oncology and Hematology [facility] [place], [place] [address] Tel. [phone]    Fax. [phone]  Addendum: B12 today is still low.  I am rechecking it in 32months she is starting oral supplementation.  I, Wilburn Mylar, am acting as scribe for Dr. Sarajane Jews C. Devonne Lalani.  Joylene Grapes  Naarah Borgerding MD, have reviewed the above documentation for accuracy and completeness, and I agree with the above.   *Total Encounter Time as defined by the Centers for Medicare and Medicaid Services includes, in addition to the face-to-face time of a patient visit (documented in the note above) non-face-to-face time: obtaining and reviewing outside history, ordering and reviewing medications, tests or procedures, care coordination (communications with other health care professionals or caregivers) and documentation in the medical record.

## 2020-12-07 ENCOUNTER — Other Ambulatory Visit: Payer: Self-pay

## 2020-12-07 ENCOUNTER — Encounter: Payer: Self-pay | Admitting: Oncology

## 2020-12-07 ENCOUNTER — Other Ambulatory Visit: Payer: Self-pay | Admitting: Oncology

## 2020-12-07 ENCOUNTER — Inpatient Hospital Stay (HOSPITAL_BASED_OUTPATIENT_CLINIC_OR_DEPARTMENT_OTHER): Payer: BC Managed Care – PPO | Admitting: Oncology

## 2020-12-07 ENCOUNTER — Inpatient Hospital Stay: Payer: BC Managed Care – PPO

## 2020-12-07 ENCOUNTER — Inpatient Hospital Stay: Payer: BC Managed Care – PPO | Attending: Oncology

## 2020-12-07 VITALS — BP 120/69 | HR 79 | Temp 98.1°F | Resp 16 | Ht 64.0 in | Wt 192.0 lb

## 2020-12-07 DIAGNOSIS — C50411 Malignant neoplasm of upper-outer quadrant of right female breast: Secondary | ICD-10-CM | POA: Insufficient documentation

## 2020-12-07 DIAGNOSIS — D539 Nutritional anemia, unspecified: Secondary | ICD-10-CM

## 2020-12-07 DIAGNOSIS — E538 Deficiency of other specified B group vitamins: Secondary | ICD-10-CM | POA: Insufficient documentation

## 2020-12-07 DIAGNOSIS — Z79811 Long term (current) use of aromatase inhibitors: Secondary | ICD-10-CM | POA: Diagnosis not present

## 2020-12-07 DIAGNOSIS — Z170 Estrogen receptor positive status [ER+]: Secondary | ICD-10-CM | POA: Diagnosis not present

## 2020-12-07 DIAGNOSIS — D519 Vitamin B12 deficiency anemia, unspecified: Secondary | ICD-10-CM | POA: Diagnosis not present

## 2020-12-07 LAB — COMPREHENSIVE METABOLIC PANEL
ALT: 14 U/L (ref 0–44)
AST: 21 U/L (ref 15–41)
Albumin: 3.2 g/dL — ABNORMAL LOW (ref 3.5–5.0)
Alkaline Phosphatase: 96 U/L (ref 38–126)
Anion gap: 6 (ref 5–15)
BUN: <4 mg/dL — ABNORMAL LOW (ref 6–20)
CO2: 24 mmol/L (ref 22–32)
Calcium: 9 mg/dL (ref 8.9–10.3)
Chloride: 110 mmol/L (ref 98–111)
Creatinine, Ser: 0.74 mg/dL (ref 0.44–1.00)
GFR, Estimated: >60 mL/min (ref >60)
Glucose, Bld: 88 mg/dL (ref 70–99)
Potassium: 4 mmol/L (ref 3.5–5.1)
Sodium: 140 mmol/L (ref 135–145)
Total Bilirubin: 0.3 mg/dL (ref 0.3–1.2)
Total Protein: 6.8 g/dL (ref 6.5–8.1)

## 2020-12-07 LAB — CBC WITH DIFFERENTIAL/PLATELET
Abs Immature Granulocytes: 0.01 10*3/uL (ref 0.00–0.07)
Basophils Absolute: 0 10*3/uL (ref 0.0–0.1)
Basophils Relative: 0 %
Eosinophils Absolute: 0.1 10*3/uL (ref 0.0–0.5)
Eosinophils Relative: 1 %
HCT: 34.1 % — ABNORMAL LOW (ref 36.0–46.0)
Hemoglobin: 11.7 g/dL — ABNORMAL LOW (ref 12.0–15.0)
Immature Granulocytes: 0 %
Lymphocytes Relative: 29 %
Lymphs Abs: 1.6 10*3/uL (ref 0.7–4.0)
MCH: 32.1 pg (ref 26.0–34.0)
MCHC: 34.3 g/dL (ref 30.0–36.0)
MCV: 93.4 fL (ref 80.0–100.0)
Monocytes Absolute: 0.5 10*3/uL (ref 0.1–1.0)
Monocytes Relative: 9 %
Neutro Abs: 3.4 10*3/uL (ref 1.7–7.7)
Neutrophils Relative %: 61 %
Platelets: 286 10*3/uL (ref 150–400)
RBC: 3.65 MIL/uL — ABNORMAL LOW (ref 3.87–5.11)
RDW: 12 % (ref 11.5–15.5)
WBC: 5.6 10*3/uL (ref 4.0–10.5)
nRBC: 0 % (ref 0.0–0.2)

## 2020-12-07 LAB — VITAMIN B12: Vitamin B-12: 163 pg/mL — ABNORMAL LOW (ref 180–914)

## 2020-12-08 ENCOUNTER — Telehealth: Payer: Self-pay | Admitting: Oncology

## 2020-12-08 NOTE — Telephone Encounter (Signed)
Scheduled appt per 8/11 sch msg. Pt aware.

## 2021-01-17 DIAGNOSIS — M793 Panniculitis, unspecified: Secondary | ICD-10-CM | POA: Diagnosis not present

## 2021-02-12 DIAGNOSIS — K769 Liver disease, unspecified: Secondary | ICD-10-CM | POA: Diagnosis not present

## 2021-02-12 DIAGNOSIS — Z853 Personal history of malignant neoplasm of breast: Secondary | ICD-10-CM | POA: Diagnosis not present

## 2021-02-12 DIAGNOSIS — D1771 Benign lipomatous neoplasm of kidney: Secondary | ICD-10-CM | POA: Diagnosis not present

## 2021-02-14 ENCOUNTER — Encounter: Payer: Self-pay | Admitting: Adult Health

## 2021-02-14 DIAGNOSIS — N898 Other specified noninflammatory disorders of vagina: Secondary | ICD-10-CM | POA: Diagnosis not present

## 2021-02-14 DIAGNOSIS — N390 Urinary tract infection, site not specified: Secondary | ICD-10-CM | POA: Diagnosis not present

## 2021-02-14 DIAGNOSIS — R300 Dysuria: Secondary | ICD-10-CM | POA: Diagnosis not present

## 2021-02-14 NOTE — Telephone Encounter (Signed)
Open for chart review no changes made. 

## 2021-03-01 ENCOUNTER — Other Ambulatory Visit: Payer: Self-pay

## 2021-03-01 ENCOUNTER — Inpatient Hospital Stay: Payer: BC Managed Care – PPO | Attending: Oncology

## 2021-03-01 DIAGNOSIS — Z170 Estrogen receptor positive status [ER+]: Secondary | ICD-10-CM | POA: Insufficient documentation

## 2021-03-01 DIAGNOSIS — D519 Vitamin B12 deficiency anemia, unspecified: Secondary | ICD-10-CM

## 2021-03-01 DIAGNOSIS — D539 Nutritional anemia, unspecified: Secondary | ICD-10-CM

## 2021-03-01 DIAGNOSIS — C50411 Malignant neoplasm of upper-outer quadrant of right female breast: Secondary | ICD-10-CM

## 2021-03-01 DIAGNOSIS — E538 Deficiency of other specified B group vitamins: Secondary | ICD-10-CM | POA: Insufficient documentation

## 2021-03-01 LAB — CBC WITH DIFFERENTIAL/PLATELET
Abs Immature Granulocytes: 0 10*3/uL (ref 0.00–0.07)
Basophils Absolute: 0 10*3/uL (ref 0.0–0.1)
Basophils Relative: 1 %
Eosinophils Absolute: 0.1 10*3/uL (ref 0.0–0.5)
Eosinophils Relative: 1 %
HCT: 35.4 % — ABNORMAL LOW (ref 36.0–46.0)
Hemoglobin: 11.9 g/dL — ABNORMAL LOW (ref 12.0–15.0)
Immature Granulocytes: 0 %
Lymphocytes Relative: 28 %
Lymphs Abs: 1.6 10*3/uL (ref 0.7–4.0)
MCH: 32 pg (ref 26.0–34.0)
MCHC: 33.6 g/dL (ref 30.0–36.0)
MCV: 95.2 fL (ref 80.0–100.0)
Monocytes Absolute: 0.4 10*3/uL (ref 0.1–1.0)
Monocytes Relative: 7 %
Neutro Abs: 3.6 10*3/uL (ref 1.7–7.7)
Neutrophils Relative %: 63 %
Platelets: 304 10*3/uL (ref 150–400)
RBC: 3.72 MIL/uL — ABNORMAL LOW (ref 3.87–5.11)
RDW: 13 % (ref 11.5–15.5)
WBC: 5.6 10*3/uL (ref 4.0–10.5)
nRBC: 0 % (ref 0.0–0.2)

## 2021-03-01 LAB — COMPREHENSIVE METABOLIC PANEL
ALT: 9 U/L (ref 0–44)
AST: 22 U/L (ref 15–41)
Albumin: 3.5 g/dL (ref 3.5–5.0)
Alkaline Phosphatase: 91 U/L (ref 38–126)
Anion gap: 7 (ref 5–15)
BUN: 6 mg/dL (ref 6–20)
CO2: 27 mmol/L (ref 22–32)
Calcium: 9.2 mg/dL (ref 8.9–10.3)
Chloride: 108 mmol/L (ref 98–111)
Creatinine, Ser: 0.71 mg/dL (ref 0.44–1.00)
GFR, Estimated: >60 mL/min (ref >60)
Glucose, Bld: 88 mg/dL (ref 70–99)
Potassium: 4.5 mmol/L (ref 3.5–5.1)
Sodium: 142 mmol/L (ref 135–145)
Total Bilirubin: 0.4 mg/dL (ref 0.3–1.2)
Total Protein: 7.1 g/dL (ref 6.5–8.1)

## 2021-03-01 LAB — VITAMIN B12: Vitamin B-12: 490 pg/mL (ref 180–914)

## 2021-03-06 DIAGNOSIS — M542 Cervicalgia: Secondary | ICD-10-CM | POA: Diagnosis not present

## 2021-03-14 NOTE — Progress Notes (Signed)
NEUROLOGY FOLLOW UP OFFICE NOTE  Alice [name] [id number]  Assessment/Plan:   Left-sided occipital neuralgia Cervicalgia Chiari 1 malformation Primary stabbing headache, resolved  1 She has had side effects to two neuropathic pain medications (gabapentin and nortriptyline).  Will check MRI of cervical spine to evaluate for structural etiology 2  Further recommendations pending results.  Would rather avoid medication at this time. 3  Follow up after testing  Subjective:  Alice [name] is a 60 year old female with osteoporosis, chronic constipation and right sided breast cancer (status post chemotherapy) who follows up for neck pain.   UPDATE: Last seen in October 2021.  Due to new left-sided headache, performed MRI brain with and without contrast and MRA head on 03/08/2020, personally reviewed, which showed nonspecific cerebral white matter signal abnormalities but otherwise unremarkable. Started nortriptyline for headache but it caused side effects.  They subsequently improved.  For the past month, she reports a burning sensation in the left occipital region radiating down the left posterior neck.  Feels like a crick in the neck.  It is intermittent throughout the day.  She was prescribed a medication, possibly gabapentin, which caused drowsiness and incoordination.     Current NSAIDS:/steroids  none Current analgesics:  Extra-strength Tylenol Current triptans:  none Current ergotamine:  none Current anti-emetic:  none Current muscle relaxants:  none Current anti-anxiolytic:  none Current sleep aide:  none Current Antihypertensive medications:  none Current Antidepressant medications:  none Current Anticonvulsant medications:  none Current anti-CGRP:  none Current Vitamins/Herbal/Supplements:  none Current Antihistamines/Decongestants:  none Other therapy:  none Hormone/birth control:  none   HISTORY: In September 2020, she started experiencing 6-7/10  aching and sharp pain in left posterior side of her neck.  It lasts a few seconds and occurs 2 to 3 times a week.  She reports tingling in the fingertips of both hands which is attributed to chemotherapy.  However, she denies radicular pain, weakness or radicular numbness in the upper extremities.  No diplopia or dysphagia.  No preceding trauma.  MRI of brain without contrast from 08/19/2018 was unremarkable except mild chronic small vessel ischemic changes and for cerebellar tonsillar ectopia extending 7 mm below the foramen magnum, consistent with a Chiari 1 malformation with no evidence of compression.  By 2021, neck pain mostly subsided but then reported moderate sharp left (rarely right) sided temporal headaches lasting seconds but occurring several times a day about 2-3 days a week.  No associated autonomic symptoms.  Past medication:  nortriptyline  PAST MEDICAL HISTORY: Past Medical History:  Diagnosis Date   Anemia    Breast cancer ([place])    right in 1997   Family history of lung cancer    Family history of ovarian cancer    GERD (gastroesophageal reflux disease) 06/30/2018   Herpes simplex type 2 infection 02/2006   History of breast cancer 1997   History of esophagogastroduodenoscopy (EGD) 09/15/2015   small hiatal hernia was noted otherwise all was normal   Osteoporosis    Personal history of breast cancer     MEDICATIONS: Current Outpatient Medications on File Prior to Visit  Medication Sig Dispense Refill   anastrozole (ARIMIDEX) 1 MG tablet TAKE 1 TABLET(1 MG) BY MOUTH DAILY 90 tablet 4   Vitamin D, Ergocalciferol, (DRISDOL) 1.25 MG (50000 UNIT) CAPS capsule Take 1 capsule (50,000 Units total) by mouth 2 (two) times a week. (Patient taking differently: Take 50,000 Units by mouth every 7 (seven) days. ) 24 capsule 0  No current facility-administered medications on file prior to visit.    ALLERGIES: No Known Allergies  FAMILY HISTORY: Family History  Problem Relation  Age of Onset   Ovarian cancer Sister 77   Dementia Mother    Aneurysm Father       Objective:  Blood pressure 121/82, pulse 84, height 5\' 4"  (1.626 m), weight 189 lb 3.2 oz (85.8 kg), SpO2 100 %. General: No acute distress.  Patient appears well-groomed.   Head:  Normocephalic/atraumatic Eyes:  Fundi examined but not visualized Neck: supple, left sided suboccipital/paraspinal tenderness, full range of motion Heart:  Regular rate and rhythm Lungs:  Clear to auscultation bilaterally Back: No paraspinal tenderness Neurological Exam: alert and oriented to person, place, and time.  Speech fluent and not dysarthric, language intact.  CN II-XII intact. Bulk and tone normal, muscle strength 5/5 throughout.  Sensation to light touch intact.  Deep tendon reflexes 2+ throughout, toes downgoing.  Finger to nose testing intact.  Gait normal, Romberg negative.   Metta Clines, DO  CC: [name], MD

## 2021-03-15 ENCOUNTER — Other Ambulatory Visit: Payer: Self-pay

## 2021-03-15 ENCOUNTER — Ambulatory Visit: Payer: BC Managed Care – PPO | Admitting: Neurology

## 2021-03-15 ENCOUNTER — Encounter: Payer: Self-pay | Admitting: Neurology

## 2021-03-15 VITALS — BP 121/82 | HR 84 | Ht 64.0 in | Wt 189.2 lb

## 2021-03-15 DIAGNOSIS — M542 Cervicalgia: Secondary | ICD-10-CM | POA: Diagnosis not present

## 2021-03-15 DIAGNOSIS — M5481 Occipital neuralgia: Secondary | ICD-10-CM | POA: Diagnosis not present

## 2021-03-15 DIAGNOSIS — G935 Compression of brain: Secondary | ICD-10-CM

## 2021-03-15 NOTE — Patient Instructions (Signed)
We will check MRI of cervical spine without contrast Further recommendations pending results Follow up after testing

## 2021-04-07 ENCOUNTER — Ambulatory Visit
Admission: RE | Admit: 2021-04-07 | Discharge: 2021-04-07 | Disposition: A | Discharge status: Home / Self Care | Payer: BC Managed Care – PPO | Source: Ambulatory Visit | Attending: Neurology | Admitting: Neurology

## 2021-04-07 ENCOUNTER — Other Ambulatory Visit: Payer: Self-pay

## 2021-04-07 DIAGNOSIS — M542 Cervicalgia: Secondary | ICD-10-CM | POA: Diagnosis not present

## 2021-04-07 DIAGNOSIS — M5481 Occipital neuralgia: Secondary | ICD-10-CM

## 2021-04-07 DIAGNOSIS — M2578 Osteophyte, vertebrae: Secondary | ICD-10-CM | POA: Diagnosis not present

## 2021-04-07 DIAGNOSIS — G935 Compression of brain: Secondary | ICD-10-CM

## 2021-05-09 ENCOUNTER — Telehealth: Payer: Self-pay | Admitting: Hematology and Oncology

## 2021-05-09 NOTE — Telephone Encounter (Signed)
Rescheduled appointment per provider template. Patient aware.

## 2021-06-11 ENCOUNTER — Inpatient Hospital Stay: Payer: BC Managed Care – PPO | Attending: Oncology

## 2021-06-11 DIAGNOSIS — C50411 Malignant neoplasm of upper-outer quadrant of right female breast: Secondary | ICD-10-CM | POA: Insufficient documentation

## 2021-06-11 DIAGNOSIS — E538 Deficiency of other specified B group vitamins: Secondary | ICD-10-CM | POA: Insufficient documentation

## 2021-06-11 DIAGNOSIS — M810 Age-related osteoporosis without current pathological fracture: Secondary | ICD-10-CM | POA: Insufficient documentation

## 2021-06-11 DIAGNOSIS — Z79811 Long term (current) use of aromatase inhibitors: Secondary | ICD-10-CM | POA: Insufficient documentation

## 2021-06-13 ENCOUNTER — Inpatient Hospital Stay: Payer: BC Managed Care – PPO

## 2021-06-13 ENCOUNTER — Other Ambulatory Visit: Payer: Self-pay

## 2021-06-13 ENCOUNTER — Ambulatory Visit: Payer: BC Managed Care – PPO | Admitting: Hematology and Oncology

## 2021-06-13 DIAGNOSIS — M810 Age-related osteoporosis without current pathological fracture: Secondary | ICD-10-CM | POA: Diagnosis not present

## 2021-06-13 DIAGNOSIS — C50411 Malignant neoplasm of upper-outer quadrant of right female breast: Secondary | ICD-10-CM

## 2021-06-13 DIAGNOSIS — D519 Vitamin B12 deficiency anemia, unspecified: Secondary | ICD-10-CM

## 2021-06-13 DIAGNOSIS — E538 Deficiency of other specified B group vitamins: Secondary | ICD-10-CM | POA: Diagnosis not present

## 2021-06-13 DIAGNOSIS — Z79811 Long term (current) use of aromatase inhibitors: Secondary | ICD-10-CM | POA: Diagnosis not present

## 2021-06-13 LAB — CBC WITH DIFFERENTIAL/PLATELET
Abs Immature Granulocytes: 0.01 10*3/uL (ref 0.00–0.07)
Basophils Absolute: 0.1 10*3/uL (ref 0.0–0.1)
Basophils Relative: 1 %
Eosinophils Absolute: 0.1 10*3/uL (ref 0.0–0.5)
Eosinophils Relative: 2 %
HCT: 36 % (ref 36.0–46.0)
Hemoglobin: 12.1 g/dL (ref 12.0–15.0)
Immature Granulocytes: 0 %
Lymphocytes Relative: 35 %
Lymphs Abs: 1.7 10*3/uL (ref 0.7–4.0)
MCH: 31.8 pg (ref 26.0–34.0)
MCHC: 33.6 g/dL (ref 30.0–36.0)
MCV: 94.7 fL (ref 80.0–100.0)
Monocytes Absolute: 0.4 10*3/uL (ref 0.1–1.0)
Monocytes Relative: 8 %
Neutro Abs: 2.5 10*3/uL (ref 1.7–7.7)
Neutrophils Relative %: 54 %
Platelets: 330 10*3/uL (ref 150–400)
RBC: 3.8 MIL/uL — ABNORMAL LOW (ref 3.87–5.11)
RDW: 12.5 % (ref 11.5–15.5)
WBC: 4.7 10*3/uL (ref 4.0–10.5)
nRBC: 0 % (ref 0.0–0.2)

## 2021-06-13 LAB — COMPREHENSIVE METABOLIC PANEL
ALT: 11 U/L (ref 0–44)
AST: 22 U/L (ref 15–41)
Albumin: 3.7 g/dL (ref 3.5–5.0)
Alkaline Phosphatase: 92 U/L (ref 38–126)
Anion gap: 4 — ABNORMAL LOW (ref 5–15)
BUN: <5 mg/dL — ABNORMAL LOW (ref 6–20)
CO2: 27 mmol/L (ref 22–32)
Calcium: 9.1 mg/dL (ref 8.9–10.3)
Chloride: 108 mmol/L (ref 98–111)
Creatinine, Ser: 0.64 mg/dL (ref 0.44–1.00)
GFR, Estimated: >60 mL/min (ref >60)
Glucose, Bld: 89 mg/dL (ref 70–99)
Potassium: 4.1 mmol/L (ref 3.5–5.1)
Sodium: 139 mmol/L (ref 135–145)
Total Bilirubin: 0.4 mg/dL (ref 0.3–1.2)
Total Protein: 7 g/dL (ref 6.5–8.1)

## 2021-06-13 LAB — VITAMIN B12: Vitamin B-12: 286 pg/mL (ref 180–914)

## 2021-06-15 ENCOUNTER — Other Ambulatory Visit: Payer: Self-pay

## 2021-06-15 ENCOUNTER — Encounter: Payer: Self-pay | Admitting: Hematology and Oncology

## 2021-06-15 ENCOUNTER — Inpatient Hospital Stay (HOSPITAL_BASED_OUTPATIENT_CLINIC_OR_DEPARTMENT_OTHER): Payer: BC Managed Care – PPO | Admitting: Hematology and Oncology

## 2021-06-15 VITALS — BP 136/83 | HR 94 | Temp 97.7°F | Resp 16 | Ht 64.0 in | Wt 189.4 lb

## 2021-06-15 DIAGNOSIS — M810 Age-related osteoporosis without current pathological fracture: Secondary | ICD-10-CM | POA: Diagnosis not present

## 2021-06-15 DIAGNOSIS — C50411 Malignant neoplasm of upper-outer quadrant of right female breast: Secondary | ICD-10-CM | POA: Diagnosis not present

## 2021-06-15 DIAGNOSIS — Z170 Estrogen receptor positive status [ER+]: Secondary | ICD-10-CM

## 2021-06-15 DIAGNOSIS — E538 Deficiency of other specified B group vitamins: Secondary | ICD-10-CM | POA: Diagnosis not present

## 2021-06-15 DIAGNOSIS — Z79811 Long term (current) use of aromatase inhibitors: Secondary | ICD-10-CM | POA: Diagnosis not present

## 2021-06-15 MED ORDER — ANASTROZOLE 1 MG PO TABS: 1.0000 mg | ORAL_TABLET | Freq: Every day | ORAL | 4 refills | Status: DC

## 2021-06-15 NOTE — Progress Notes (Signed)
Alice [name]  [phone] Fax:(336) [phone]    ID: Alice [name] DOB: Jun 05, 1960  MR#: [id number]  OEV#:[id number]  Patient Care Team: [name], MD as PCP - General (Family Medicine) Mauro Kaufmann, RN as Oncology Nurse Navigator Rockwell Germany, RN as Oncology Nurse Navigator Magrinat, Virgie Dad, MD (Inactive) as Consulting Physician (Oncology) Kyung Rudd, MD as Consulting Physician (Radiation Oncology) Rolm Bookbinder, MD as Consulting Physician (General Surgery) Earnstine Regal, PA-C as Consulting Physician (Obstetrics and Gynecology) Juanita Craver, MD as Consulting Physician (Gastroenterology) [name], MD as Consulting Physician (Family Medicine) Cristine Polio, MD as Consulting Physician (Plastic Surgery) Benay Pike, MD   OTHER MD:  CHIEF COMPLAINT: Estrogen receptor positive breast cancer (s/p right mastectomy)  CURRENT TREATMENT: Anastrozole; vit B-12 parenterally  INTERVAL HISTORY:  Khrystyna was seen today for follow up of her estrogen receptor positive breast cancer.  She restarted anastrozole on 04/06/2019.  She tolerates that generally well, but is concerned because of the osteoporosis problem. She is here for follow-up.  Since last visit, she has been feeling well.  She reports no breast changes.   She has noticed some itching at the site of her previous drain. She is not quite keen to take any medication for osteoporosis, apparently she has done some reading about it and she is worried about the increased risk of fractures after discontinuing treatment she is also worried about the side effects.   She does not have any active dental concerns.  She has been exercising regularly and lifting some weights, tries to eat food which is rich in vitamin B12.  She used to take vitamin D supplementation but has not been taking it recently.  REVIEW OF SYSTEMS:  A detailed review of systems today was otherwise  stable   COVID 19 VACCINATION STATUS: Refuses vaccination; has not had COVID as of August 2022   HISTORY OF CURRENT ILLNESS: From the original intake note:  Alice [name] has a history of remote right breast cancer in 1997, treated with right lumpectomy and radiation in [place].  The patient did not receive chemotherapy or antiestrogen treatments.  She presented for her annual mammogram with a non-tender area of firmness in the right breast at 9 o'clock for 2 months in 2020. She underwent bilateral diagnostic mammography with tomography and right breast ultrasonography at [facility] on 09/08/2018 showing: breast density category A; scattered-appearing fibroglandular tissue at the lumpectomy site in the upper outer right breast middle depth spanning 3.4 cm, with indistinct margins, was not seen on prior mammogram from 2018.  There was also a keloid scar in the inferior medial left breast.  On physical exam there was a rectangular area of hardness measuring up to 7 cm in the right breast at 9:00, corresponding with the prior lumpectomy and consistent with radiation change.  Ultrasound of this area showed only postlumpectomy change, with no suspicious or discrete mass.  Ultrasound of the axilla was benign  Accordingly on 09/23/2018 she proceeded to biopsy of the right breast area in question. The pathology from this procedure SAA 20-07/02/2000 showed: invasive ductal carcinoma, E-cadherin positive,  grade 3,  Prognostic indicators significant for: estrogen receptor at 100% positive and progesterone receptor, 30% positive, both with strong staining intensity proliferation marker Ki67 at 70%. HER2 negative by immunohistochemistry (0).  The patient's subsequent history is as detailed below.   PAST MEDICAL HISTORY: Past Medical History:  Diagnosis Date   Anemia    Breast cancer ([place])    right in  1997   Family history of lung cancer    Family history of ovarian cancer    GERD  (gastroesophageal reflux disease) 06/30/2018   Herpes simplex type 2 infection 02/2006   History of breast cancer 1997   History of esophagogastroduodenoscopy (EGD) 09/15/2015   small hiatal hernia was noted otherwise all was normal   Osteoporosis    Personal history of breast cancer     PAST SURGICAL HISTORY: Past Surgical History:  Procedure Laterality Date   BREAST ENHANCEMENT SURGERY     s/p cancer and surgery, right   BREAST LUMPECTOMY     right due to breast cancer   MASTECTOMY W/ SENTINEL NODE BIOPSY Right 10/22/2018   Procedure: RIGHT MASTECTOMY WITH RIGHT AXILLARY SENTINEL LYMPH NODE BIOPSY;  Surgeon: Rolm Bookbinder, MD;  Location: [facility];  Service: General;  Laterality: Right;  wisdom teeth removal   FAMILY HISTORY: Family History  Problem Relation Age of Onset   Ovarian cancer Sister 5   Dementia Mother    Aneurysm Father   Patient's father was 74 years old when he died from aneurysm. Patient's mother died from dementia at age 3. Her sister was diagnosed with ovarian cancer at age 39, and this subsequently took her life.  In total of the patient has 5 brothers and 2 sisters.  She is not aware of other cancers in the family   GYNECOLOGIC HISTORY:  No LMP recorded. Patient is postmenopausal. Menarche: 61 years old [place] P 0 LMP age 89 Contraceptive none HRT none  Hysterectomy? no BSO? 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 She is married. Husband Edmonia Caprio is from [place], so his native language is [place]. She lives at home with her husband. They have no children. She attends a [employer] in [place].    ADVANCED DIRECTIVES: In the absence of documents to the contrary her husband is automatically her HCPOA.   HEALTH MAINTENANCE: Social History   Tobacco Use   Smoking status: Never   Smokeless tobacco: Never  Vaping Use   Vaping Use: Never used  Substance Use  Topics   Alcohol use: No    Alcohol/week: 0.0 standard drinks   Drug use: No     Colonoscopy: endoscopy in 2017, colonoscopy due 2020 but delayed due to virus  PAP: 12/03/2017, normal  Bone density: 11/2018, -3.8   No Known Allergies  Current Outpatient Medications  Medication Sig Dispense Refill   anastrozole (ARIMIDEX) 1 MG tablet TAKE 1 TABLET(1 MG) BY MOUTH DAILY 90 tablet 4   Vitamin D, Ergocalciferol, (DRISDOL) 1.25 MG (50000 UNIT) CAPS capsule Take 1 capsule (50,000 Units total) by mouth 2 (two) times a week. (Patient not taking: Reported on 03/15/2021) 24 capsule 0   No current facility-administered medications for this visit.    OBJECTIVE: African-American woman in no acute distress  There were no vitals filed for this visit.  Wt Readings from Last 3 Encounters:  03/15/21 189 lb 3.2 oz (85.8 kg)  12/07/20 192 lb (87.1 kg)  06/22/20 187 lb 4.8 oz (85 kg)    There were no vitals filed for this visit.  ECOG: 1  Sclerae unicteric, EOMs intact Wearing a mask No cervical or supraclavicular adenopathy Lungs no rales or rhonchi Heart regular rate and rhythm Abd soft, nontender, positive bowel sounds MSK no focal spinal tenderness, no upper extremity lymphedema Neuro: nonfocal, well oriented, appropriate affect Breasts: Both breasts inspected.  Right breast is postmastectomy, no evidence of recurrence.  No axillary adenopathy or other regional adenopathy.  Left breast normal to inspection and palpation.  No palpable masses or regional adenopathy.   LAB RESULTS:  CMP     Component Value Date/Time   NA 139 06/13/2021 1115   K 4.1 06/13/2021 1115   CL 108 06/13/2021 1115   CO2 27 06/13/2021 1115   GLUCOSE 89 06/13/2021 1115   BUN <5 (L) 06/13/2021 1115   CREATININE 0.64 06/13/2021 1115   CREATININE 0.75 03/08/2020 1544   CALCIUM 9.1 06/13/2021 1115   PROT 7.0 06/13/2021 1115   ALBUMIN 3.7 06/13/2021 1115   AST 22 06/13/2021 1115   AST 49 (H) 02/10/2019 1229    ALT 11 06/13/2021 1115   ALT 23 02/10/2019 1229   ALKPHOS 92 06/13/2021 1115   BILITOT 0.4 06/13/2021 1115   BILITOT 0.4 02/10/2019 1229   GFRNONAA >60 06/13/2021 1115   GFRNONAA 88 03/08/2020 1544   GFRAA 102 03/08/2020 1544    No results found for: TOTALPROTELP, ALBUMINELP, A1GS, A2GS, BETS, BETA2SER, GAMS, MSPIKE, SPEI  No results found for: KPAFRELGTCHN, LAMBDASER, KAPLAMBRATIO  Lab Results  Component Value Date   WBC 4.7 06/13/2021   NEUTROABS 2.5 06/13/2021   HGB 12.1 06/13/2021   HCT 36.0 06/13/2021   MCV 94.7 06/13/2021   PLT 330 06/13/2021   No results found for: LABCA2  No components found for: VOHYWV371  No results for input(s): INR in the last 168 hours.  No results found for: LABCA2  No results found for: GGY694  No results found for: WNI627  No results found for: OJJ009  No results found for: CA2729  No components found for: HGQUANT  No results found for: CEA1 / No results found for: CEA1  No results found for: AFPTUMOR  No results found for: CHROMOGRNA  No results found for: HGBA, HGBA2QUANT, HGBFQUANT, HGBSQUAN (Hemoglobinopathy evaluation)   No results found for: LDH  Lab Results  Component Value Date   IRON 96 03/13/2020   TIBC 324 03/13/2020   IRONPCTSAT 29 03/13/2020   (Iron and TIBC)  Lab Results  Component Value Date   FERRITIN 36 06/22/2020    Urinalysis    Component Value Date/Time   COLORURINE YELLOW 12/30/2018 0948   APPEARANCEUR CLEAR 12/30/2018 0948   LABSPEC 1.004 (L) 12/30/2018 0948   PHURINE 8.0 12/30/2018 [place] 12/30/2018 [place] 12/30/2018 [name] 12/30/2018 [place] 12/30/2018 0948   PROTEINUR NEGATIVE 12/30/2018 0948   NITRITE NEGATIVE 12/30/2018 0948   LEUKOCYTESUR NEGATIVE 12/30/2018 0948    STUDIES: No results found.    ELIGIBLE FOR AVAILABLE RESEARCH PROTOCOL: no  ASSESSMENT: 61 y.o. [place] woman   (1) status post  right lumpectomy in 1997,  (a) status post adjuvant radiation  (b) did not receive antiestrogens or chemotherapy  (2) status post right breast upper outer quadrant biopsy 09/23/2018 for a clinical T2 N0, stage IIA invasive ductal carcinoma, grade 3, estrogen and progesterone receptor positive, HER-2 not amplified, with an MIB-1 of 70%  (3) Oncotype obtained from the initial biopsy showed a score of 26 predicting a risk of recurrence outside the breast of 16/21% (depending on nodal status), if the patient's only systemic therapy is an antiestrogen for 5 years.  It also predicts a significant benefit from chemotherapy  (4) status post right mastectomy and sentinel lymph node sampling 10/22/2018 for a pT2 pN0, stage IB  invasive ductal carcinoma, grade 2, with negative margins  (a) not planning reconstruction at this point  (5) adjuvant chemotherapy recommended on 11/05/2018 by Dr. Jana Hakim  (a) second opinion with [facility]. Annabell Sabal at [facility] on 11/18/2018 recommended 4 cycles of adjuvant docetaxel and cyclophosphamide   (b) Third opinion with Dr. Adan Sis on 11/24/2018 recommended 4 cycles of adjuvant Docetaxel and Cyclophosphamide  (c) started adjuvant chemotherapy with docetaxel and cyclophosphamide on 12/23/2018, completed 4 cycles 02/24/2019  (6) anastrozole started 09/30/2018 (patient stopped due to concerns over bone loss--to restart once adjuvant therapy has completed)  (a) Bone density on 12/10/2018 shows a T score of -3.8 in the AP spine: Osteoporosis  (b) vitamin D level 03/11/2019 less than 10  (7) genetics testing 10/06/2018 through the [facility] Common Hereditary Cancers Panel found no deleterious mutations in APC, ATM, AXIN2, BARD1, BMPR1A, BRCA1, BRCA2, BRIP1, CDH1, CDKN2A (p14ARF), CDKN2A (p16INK4a), CKD4, CHEK2, CTNNA1, DICER1, EPCAM (Deletion/duplication testing only), GREM1 (promoter region deletion/duplication testing only), KIT, MEN1, MLH1, MSH2, MSH3, MSH6, MUTYH, NBN, NF1, NHTL1, PALB2,  PDGFRA, PMS2, POLD1, POLE, PTEN, RAD50, RAD51C, RAD51D, SDHB, SDHC, SDHD, SMAD4, SMARCA4. STK11, TP53, TSC1, TSC2, and VHL. The following genes were evaluated for sequence changes only: SDHA and HOXB13 c.251G>A variant only.   (a) 2 VUS's identified: VUS in APC c.-34014C>T and VHL c.340+272T>G identified    PLAN: Cecilie is just a little over 2.5 years out from definitive surgery for her breast cancer with no evidence of disease recurrence.  This is very favorable. Her last mammogram in April showed no evidence of malignancy.  Routine mammography was recommended in April 2023.  She already had a mammogram scheduled for March and [place].  She only wants her mammograms to be read by Dr. Luan Pulling. Her most recent B12 levels are normal, continue oral B12 supplementation. No physical examination evidence of recurrence. With regards to her osteoporosis, have recommended another bone density scan, this will be faxed to [facility] place of choice.  I have discussed about considering bisphosphonate since she is concomitantly on anastrozole which can worsen the osteoporosis.  We have discussed briefly about hypocalcemia and osteonecrosis of the jaw from bisphosphonates.  We have discussed about options such as Zometa and Prolia.  She would like to think about this.  She would want to repeat the imaging first and then consider it.  In the interim I have recommended that she continue some vitamin D supplementation, continue weight lifting and calcium supplementation as tolerated. Return to clinic in 6 months unless needed sooner.  Total encounter time 30 minutes.*   *Total Encounter Time as defined by the Centers for Medicare and Medicaid Services includes, in addition to the face-to-face time of a patient visit (documented in the note above) non-face-to-face time: obtaining and reviewing outside history, ordering and reviewing medications, tests or procedures, care coordination (communications with other health care  professionals or caregivers) and documentation in the medical record.

## 2021-06-25 DIAGNOSIS — Z1211 Encounter for screening for malignant neoplasm of colon: Secondary | ICD-10-CM | POA: Diagnosis not present

## 2021-06-25 DIAGNOSIS — Z124 Encounter for screening for malignant neoplasm of cervix: Secondary | ICD-10-CM | POA: Diagnosis not present

## 2021-06-25 DIAGNOSIS — Z01419 Encounter for gynecological examination (general) (routine) without abnormal findings: Secondary | ICD-10-CM | POA: Diagnosis not present

## 2021-06-25 DIAGNOSIS — Z1239 Encounter for other screening for malignant neoplasm of breast: Secondary | ICD-10-CM | POA: Diagnosis not present

## 2021-07-11 DIAGNOSIS — R0602 Shortness of breath: Secondary | ICD-10-CM | POA: Diagnosis not present

## 2021-07-19 DIAGNOSIS — R0602 Shortness of breath: Secondary | ICD-10-CM | POA: Diagnosis not present

## 2021-08-20 DIAGNOSIS — Z1231 Encounter for screening mammogram for malignant neoplasm of breast: Secondary | ICD-10-CM | POA: Diagnosis not present

## 2021-09-13 DIAGNOSIS — R0602 Shortness of breath: Secondary | ICD-10-CM | POA: Diagnosis not present

## 2021-09-13 DIAGNOSIS — E662 Morbid (severe) obesity with alveolar hypoventilation: Secondary | ICD-10-CM | POA: Diagnosis not present

## 2021-10-04 ENCOUNTER — Other Ambulatory Visit: Payer: Self-pay | Admitting: *Deleted

## 2021-10-04 MED ORDER — ANASTROZOLE 1 MG PO TABS: 1.0000 mg | ORAL_TABLET | Freq: Every day | ORAL | 4 refills | Status: DC

## 2021-10-17 ENCOUNTER — Ambulatory Visit: Payer: BC Managed Care – PPO | Admitting: Neurology

## 2021-10-22 NOTE — Progress Notes (Deleted)
NEUROLOGY FOLLOW UP OFFICE NOTE  Alice [name] [id number]  Assessment/Plan:   Left-sided occipital neuralgia Cervicalgia Chiari 1 malformation Primary stabbing headache, resolved   1 She has had side effects to two neuropathic pain medications (gabapentin and nortriptyline).  Will check MRI of cervical spine to evaluate for structural etiology 2  Further recommendations pending results.  Would rather avoid medication at this time. 3  Follow up after testing   Subjective:  Alice [name] is a 61 year old female with osteoporosis, chronic constipation and right sided breast cancer (status post chemotherapy) who follows up for neck pain.   UPDATE: MRI of cervical spine on 04/07/2021 personally reviewed showed multilevel spondylosis with asymmetric left-sided facet hypertrophy without significant stenosis at C2-3, moderate right foraminal narrowing at C3-4 and mild foraminal narrowing at left greater than right C4-5, right C5-6 and left C6-7.   Current NSAIDS:/steroids  none Current analgesics:  Extra-strength Tylenol Current triptans:  none Current ergotamine:  none Current anti-emetic:  none Current muscle relaxants:  none Current anti-anxiolytic:  none Current sleep aide:  none Current Antihypertensive medications:  none Current Antidepressant medications:  none Current Anticonvulsant medications:  none Current anti-CGRP:  none Current Vitamins/Herbal/Supplements:  none Current Antihistamines/Decongestants:  none Other therapy:  none Hormone/birth control:  none   HISTORY: In September 2020, she started experiencing 6-7/10 aching and sharp pain in left posterior side of her neck.  It lasts a few seconds and occurs 2 to 3 times a week.  She reports tingling in the fingertips of both hands which is attributed to chemotherapy.  However, she denies radicular pain, weakness or radicular numbness in the upper extremities.  No diplopia or dysphagia.  No preceding  trauma.  MRI of brain without contrast from 08/19/2018 was unremarkable except mild chronic small vessel ischemic changes and for cerebellar tonsillar ectopia extending 7 mm below the foramen magnum, consistent with a Chiari 1 malformation with no evidence of compression.  By 2021, neck pain mostly subsided but then reported moderate sharp left (rarely right) sided temporal headaches lasting seconds but occurring several times a day about 2-3 days a week.  No associated autonomic symptoms.  MRI brain with and without contrast and MRA head on 03/08/2020 showed nonspecific cerebral white matter signal abnormalities but otherwise unremarkable.   In late 2022, she reports a burning sensation in the left occipital region radiating down the left posterior neck.  Feels like a crick in the neck.  It is intermittent throughout the day.   Past medication:  nortriptyline (side effects), gabapentin (side effects)  PAST MEDICAL HISTORY: Past Medical History:  Diagnosis Date   Anemia    Breast cancer ([place])    right in 1997   Family history of lung cancer    Family history of ovarian cancer    GERD (gastroesophageal reflux disease) 06/30/2018   Herpes simplex type 2 infection 02/2006   History of breast cancer 1997   History of esophagogastroduodenoscopy (EGD) 09/15/2015   small hiatal hernia was noted otherwise all was normal   Osteoporosis    Personal history of breast cancer     MEDICATIONS: Current Outpatient Medications on File Prior to Visit  Medication Sig Dispense Refill   anastrozole (ARIMIDEX) 1 MG tablet Take 1 tablet (1 mg total) by mouth daily. 90 tablet 4   Vitamin D, Ergocalciferol, (DRISDOL) 1.25 MG (50000 UNIT) CAPS capsule Take 1 capsule (50,000 Units total) by mouth 2 (two) times a week. (Patient not taking: Reported on 03/15/2021) 24  capsule 0   No current facility-administered medications on file prior to visit.    ALLERGIES: No Known Allergies  FAMILY HISTORY: Family History   Problem Relation Age of Onset   Ovarian cancer Sister 33   Dementia Mother    Aneurysm Father       Objective:  *** General: No acute distress.  Patient appears well-groomed.   Head:  Normocephalic/atraumatic Eyes:  Fundi examined but not visualized Neck: supple, no paraspinal tenderness, full range of motion Heart:  Regular rate and rhythm Lungs:  Clear to auscultation bilaterally Back: No paraspinal tenderness Neurological Exam: alert and oriented to person, place, and time.  Speech fluent and not dysarthric, language intact.  CN II-XII intact. Bulk and tone normal, muscle strength 5/5 throughout.  Sensation to light touch intact.  Deep tendon reflexes 2+ throughout, toes downgoing.  Finger to nose testing intact.  Gait normal, Romberg negative.   Metta Clines, DO  CC: [name], MD

## 2021-10-23 ENCOUNTER — Ambulatory Visit: Payer: BC Managed Care – PPO | Admitting: Neurology

## 2021-10-23 ENCOUNTER — Encounter: Payer: Self-pay | Admitting: Neurology

## 2021-10-23 DIAGNOSIS — Z029 Encounter for administrative examinations, unspecified: Secondary | ICD-10-CM

## 2021-10-26 ENCOUNTER — Telehealth: Payer: Self-pay | Admitting: Neurology

## 2021-10-26 NOTE — Telephone Encounter (Signed)
I spoke with the pt and scheduled her for 02/04/22. She wanted to find out why her MRI was just her neck and spine and not the whole head?

## 2021-10-26 NOTE — Telephone Encounter (Signed)
Pt called in wanting to double check on her MRI results from her MRI done on 04/07/21

## 2021-10-26 NOTE — Telephone Encounter (Signed)
Per last ov note,  She has had side effects to two neuropathic pain medications (gabapentin and nortriptyline).  Will check MRI of cervical spine to evaluate for structural etiology. Advised patient of note, She will see [place] in October.

## 2021-10-26 NOTE — Telephone Encounter (Signed)
Patient called and said she'd like to get an MRI of the head.

## 2021-10-26 NOTE — Telephone Encounter (Signed)
Patient c/o lower head pain that radiates up the head that can last up three days.  Per Dr.Jaffe, please schedue a visit.

## 2021-10-29 NOTE — Telephone Encounter (Signed)
Spoke to patient, Patient c/o burning feeling at the base of her head. The burning can last up to three days.  Patient had MRI Brain and MRA in 02/2020, and MRI Cervical Spine 03/2021.   Advised Dr.Jaffe is out of the office today and the office will be closed in observance of independence day on 10/30/21.  Appt scheduled for 02/04/22, Will ask the front desk to add patient to wait list. Advised patient if the pain gets worse or you develop  any other symptoms. please go to the ED.

## 2021-10-31 NOTE — Telephone Encounter (Signed)
Patient added to wait list

## 2021-11-01 NOTE — Progress Notes (Signed)
NEUROLOGY FOLLOW UP OFFICE NOTE  Alice [name] [id number]  Assessment/Plan:   Left-sided occipital neuralgia Cervicalgia/cervical spondylosis Chiari I malformation Discussed further treatment options:  other medications (pregablin, duloxetine), occipital nerve block or physical therapy.  She opts for physical therapy   1 Physical therapy for neck pain and left sided occipital neuralgia 3  Follow up in 6 months.   Subjective:  Alice [name] is a 61 year old female with osteoporosis, chronic constipation and right sided breast cancer (status post chemotherapy) who follows up for headache and neck pain.   UPDATE: MRI of cervical spine on 04/07/2021 personally reviewed revealed multilevel spondylosis with asymmetric left sided facet hypertrophy at C2-3 without significant stenosis,  moderate right foraminal narrowing at C3-4, mild foraminal narrowing bilaterally at C4-5, mild right foraminal narrowing at C5-6 and mild left foraminal narrowing at C6-7.  Currently having the posterior left paraspinal region radiating up to the left occipital region with burning sensation.  May last 1-2 weeks and resolve and may come back.  Sometimes if she lays a certain way, she may feel a sensation there.     Current NSAIDS:/steroids  none Current analgesics:  Extra-strength Tylenol Current triptans:  none Current ergotamine:  none Current anti-emetic:  none Current muscle relaxants:  none Current anti-anxiolytic:  none Current sleep aide:  none Current Antihypertensive medications:  none Current Antidepressant medications:  none Current Anticonvulsant medications:  none Current anti-CGRP:  none Current Vitamins/Herbal/Supplements:  none Current Antihistamines/Decongestants:  none Other therapy:  none Hormone/birth control:  none   HISTORY: In September 2020, she started experiencing 6-7/10 aching and sharp pain in left posterior side of her neck.  It lasts a few seconds  and occurs 2 to 3 times a week.  She reports tingling in the fingertips of both hands which is attributed to chemotherapy.  However, she denies radicular pain, weakness or radicular numbness in the upper extremities.  No diplopia or dysphagia.  No preceding trauma.  MRI of brain without contrast from 08/19/2018 was unremarkable except mild chronic small vessel ischemic changes and for cerebellar tonsillar ectopia extending 7 mm below the foramen magnum, consistent with a Chiari 1 malformation with no evidence of compression.  By 2021, neck pain mostly subsided but then reported moderate sharp left (rarely right) sided temporal headaches lasting seconds but occurring several times a day about 2-3 days a week.  No associated autonomic symptoms.  Due to new left-sided headache, performed MRI brain with and without contrast and MRA head on 03/08/2020, personally reviewed, which showed nonspecific cerebral white matter signal abnormalities and unchanged 6 mm cerebellar ectopia but no acute abnormalities. Started nortriptyline for headache but it caused side effects.  They subsequently improved.  In late 2022, she reports a burning sensation in the left occipital region radiating down the left posterior neck.  Feels like a crick in the neck.  It is intermittent throughout the day.  She was prescribed a medication, possibly gabapentin, which caused drowsiness and incoordination.     Past medication:  nortriptyline, gabapentin (side effects)  PAST MEDICAL HISTORY: Past Medical History:  Diagnosis Date   Anemia    Breast cancer ([place])    right in 1997   Family history of lung cancer    Family history of ovarian cancer    GERD (gastroesophageal reflux disease) 06/30/2018   Herpes simplex type 2 infection 02/2006   History of breast cancer 1997   History of esophagogastroduodenoscopy (EGD) 09/15/2015   small hiatal hernia was noted  otherwise all was normal   Osteoporosis    Personal history of breast cancer      MEDICATIONS: Current Outpatient Medications on File Prior to Visit  Medication Sig Dispense Refill   anastrozole (ARIMIDEX) 1 MG tablet Take 1 tablet (1 mg total) by mouth daily. 90 tablet 4   Vitamin D, Ergocalciferol, (DRISDOL) 1.25 MG (50000 UNIT) CAPS capsule Take 1 capsule (50,000 Units total) by mouth 2 (two) times a week. (Patient not taking: Reported on 03/15/2021) 24 capsule 0   No current facility-administered medications on file prior to visit.    ALLERGIES: No Known Allergies  FAMILY HISTORY: Family History  Problem Relation Age of Onset   Ovarian cancer Sister 44   Dementia Mother    Aneurysm Father       Objective:  Blood pressure 122/82, pulse 93, height '5\' 4"'$  (1.626 m), weight 191 lb (86.6 kg), SpO2 99 %. General: No acute distress.  Patient appears well-groomed.   Head:  Normocephalic/atraumatic Eyes:  Fundi examined but not visualized Neck: supple, left upper paraspinal and suboccipital tenderness, full range of motion Heart:  Regular rate and rhythm Lungs:  Clear to auscultation bilaterally Back: No paraspinal tenderness Neurological Exam: alert and oriented to person, place, and time.  Speech fluent and not dysarthric, language intact.  CN II-XII intact. Bulk and tone normal, muscle strength 5/5 throughout.  Sensation to light touch intact.  Deep tendon reflexes 2+ throughout,.  Finger to nose testing intact.  Gait normal, Romberg negative.   Alice Clines, DO  CC: [name], MD

## 2021-11-02 ENCOUNTER — Encounter: Payer: Self-pay | Admitting: Neurology

## 2021-11-02 ENCOUNTER — Ambulatory Visit: Payer: BC Managed Care – PPO | Admitting: Neurology

## 2021-11-02 VITALS — BP 122/82 | HR 93 | Ht 64.0 in | Wt 191.0 lb

## 2021-11-02 DIAGNOSIS — G935 Compression of brain: Secondary | ICD-10-CM

## 2021-11-02 DIAGNOSIS — M5481 Occipital neuralgia: Secondary | ICD-10-CM | POA: Diagnosis not present

## 2021-11-02 DIAGNOSIS — M542 Cervicalgia: Secondary | ICD-10-CM

## 2021-11-02 NOTE — Patient Instructions (Signed)
Will refer you to physical therapy to treat neck pain and occipital neuralgia May use capsaicin cream or over the counter lidocaine dream Follow up 6 months.

## 2021-11-29 ENCOUNTER — Encounter: Payer: Self-pay | Admitting: Adult Health

## 2022-01-02 DIAGNOSIS — R0600 Dyspnea, unspecified: Secondary | ICD-10-CM | POA: Diagnosis not present

## 2022-01-24 DIAGNOSIS — K5900 Constipation, unspecified: Secondary | ICD-10-CM | POA: Diagnosis not present

## 2022-01-24 DIAGNOSIS — R1013 Epigastric pain: Secondary | ICD-10-CM | POA: Diagnosis not present

## 2022-02-04 ENCOUNTER — Ambulatory Visit: Payer: BC Managed Care – PPO | Admitting: Neurology

## 2022-02-27 IMAGING — MR MR CERVICAL SPINE W/O CM
5 series · 36 of 48 positions shown · non-contrast
Comparison: None.

CLINICAL DATA: Cervicalgia. Neck pain extending to the occipital
skull. The

EXAM:
MRI CERVICAL SPINE WITHOUT CONTRAST
TECHNIQUE: Multiplanar, multisequence MR imaging of the cervical spine was
performed. No intravenous contrast was administered.

[Series 2: T2 · sagittal · 3.0mm · 0.45mm/px · 8 of 15 slices shown (1 of 2)]
[im 1/15]
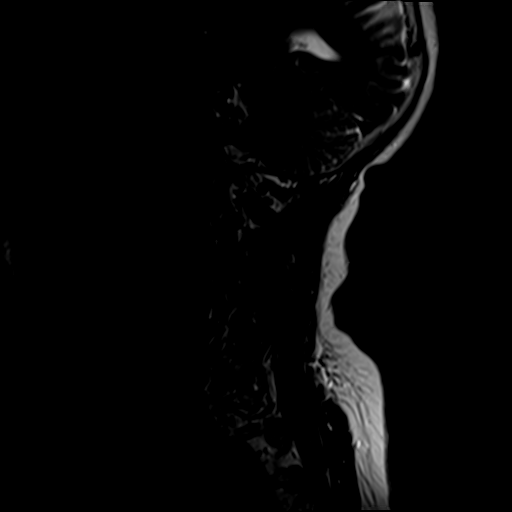
[im 3/15]
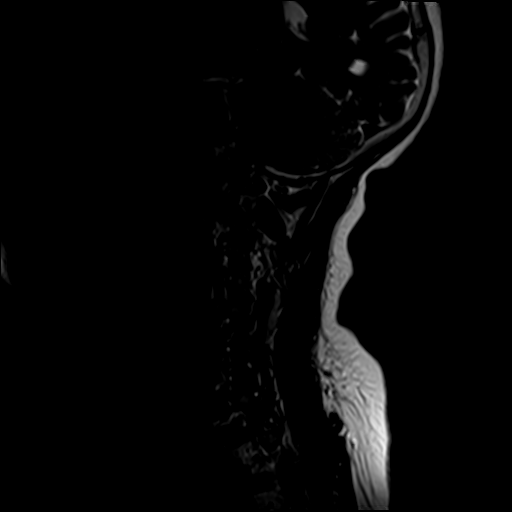
[im 5/15]
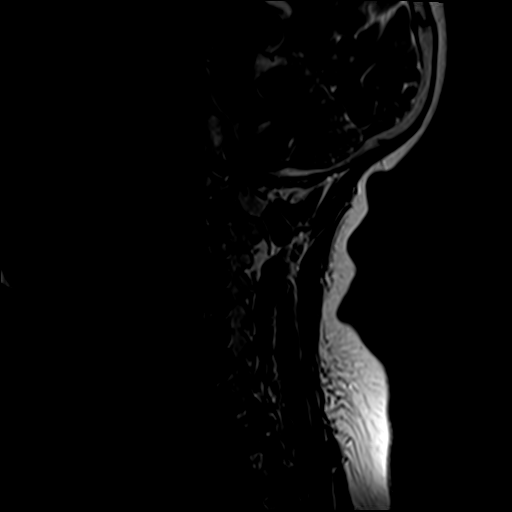
[im 7/15]
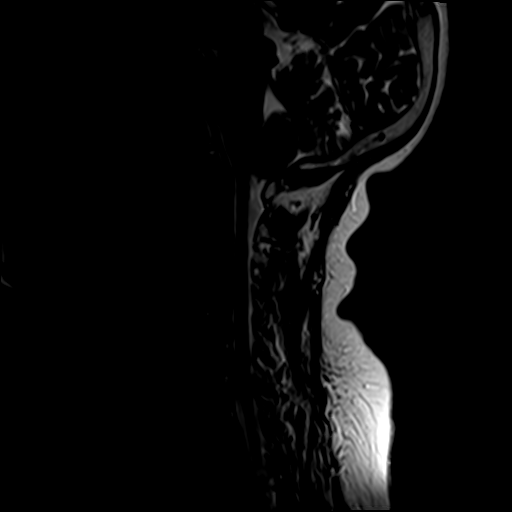
[im 9/15]
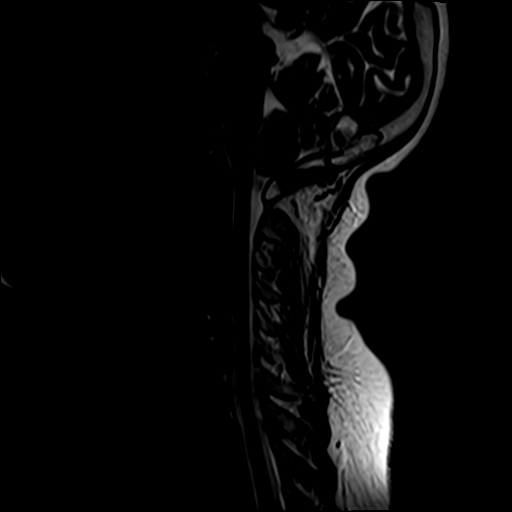
[im 11/15]
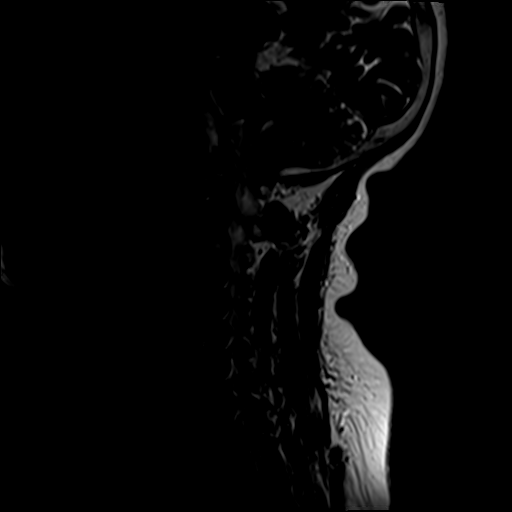
[im 13/15]
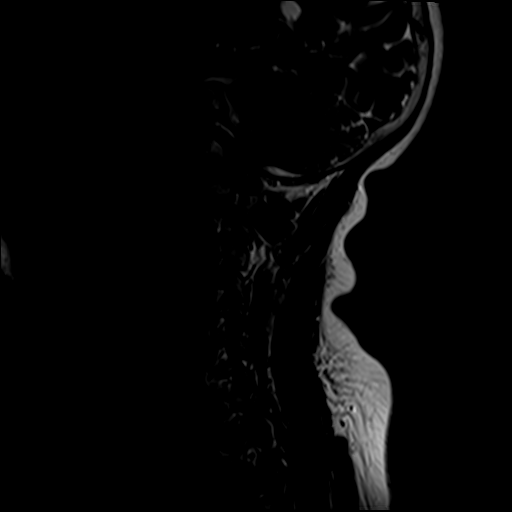
[im 15/15]
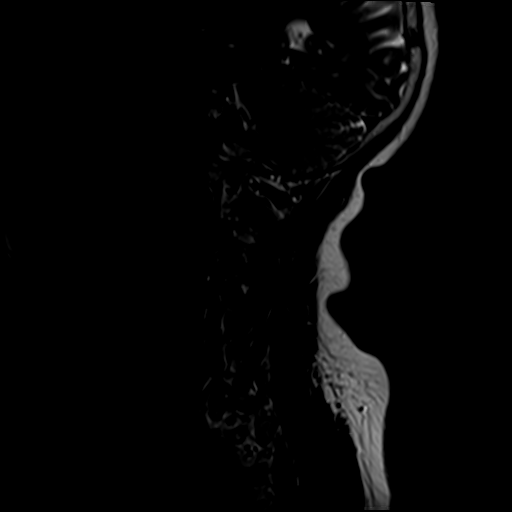

[Series 3: STIR · sagittal · 3.0mm · 0.90mm/px · 8 of 15 slices shown]
[im 1/15]
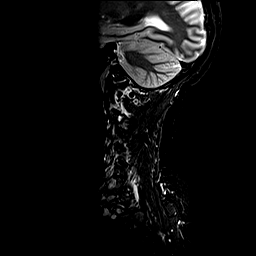
[im 3/15]
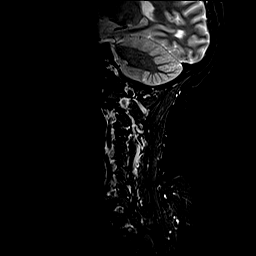
[im 5/15]
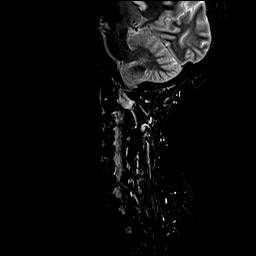
[im 7/15]
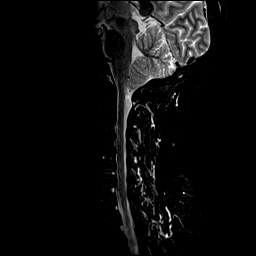
[im 9/15]
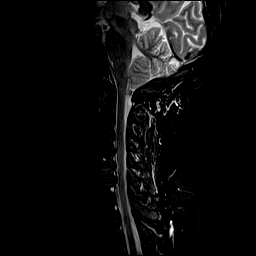
[im 11/15]
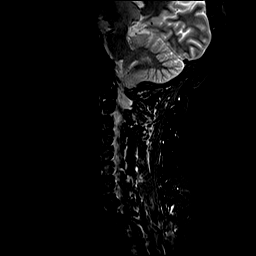
[im 13/15]
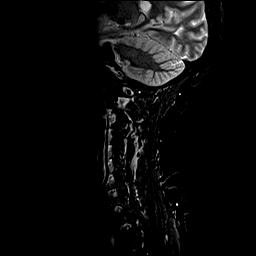
[im 15/15]
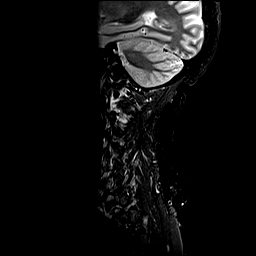

[Series 4: T1 · sagittal · 3.0mm · 0.82mm/px · 8 of 15 slices shown]
[im 1/15]
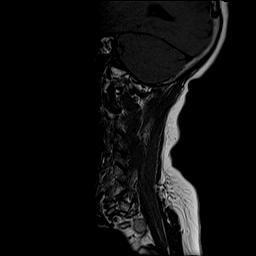
[im 3/15]
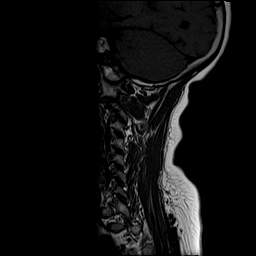
[im 5/15]
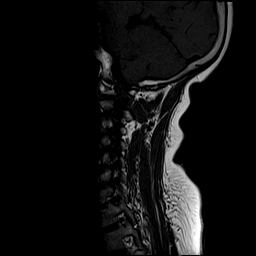
[im 7/15]
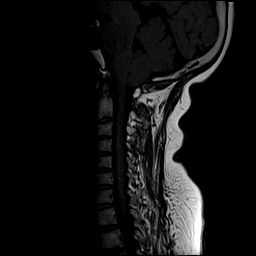
[im 9/15]
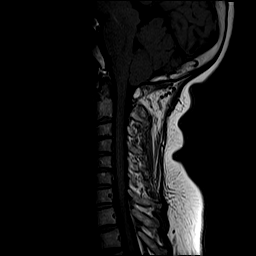
[im 11/15]
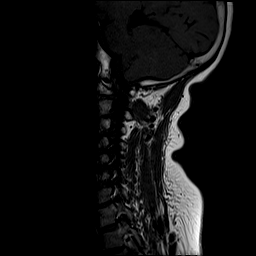
[im 13/15]
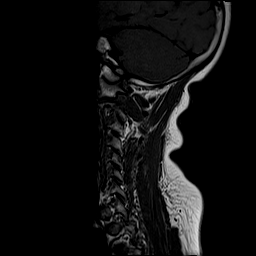
[im 15/15]
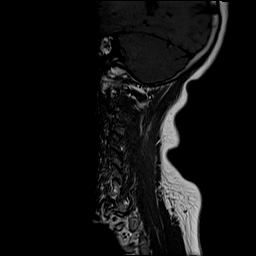

[Series 5: T2 · axial · 3.0mm · 0.70mm/px · z∈[-67,+17]mm · 9 of 24 slices shown (2 of 2)]
[im 1/24]
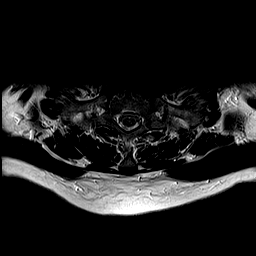
[im 5/24]
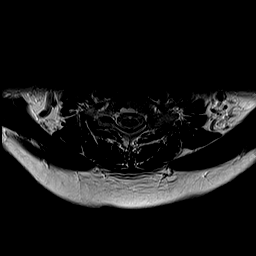
[im 7/24]
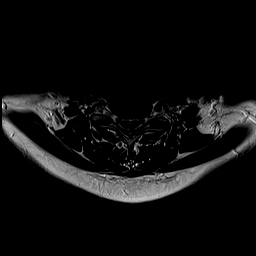
[im 11/24]
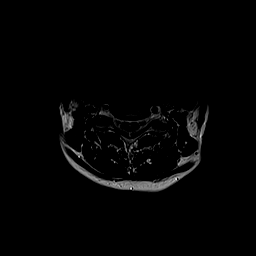
[im 13/24]
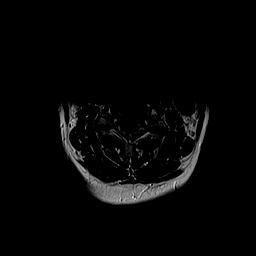
[im 17/24]
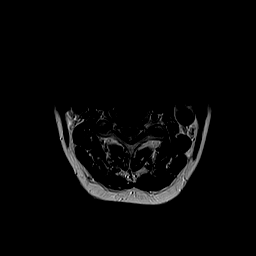
[im 19/24]
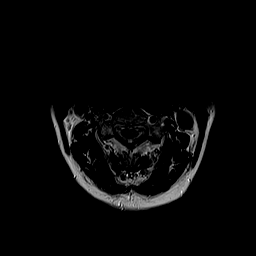
[im 21/24]
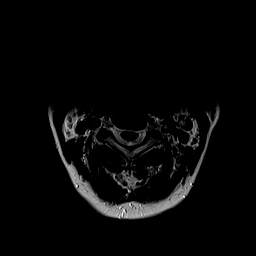
[im 24/24]
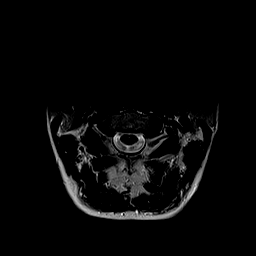

[Series 6: GRE · axial · 3.0mm · 0.35mm/px · z∈[-67,-45]mm · 3 of 24 slices shown]
[im 1/24]
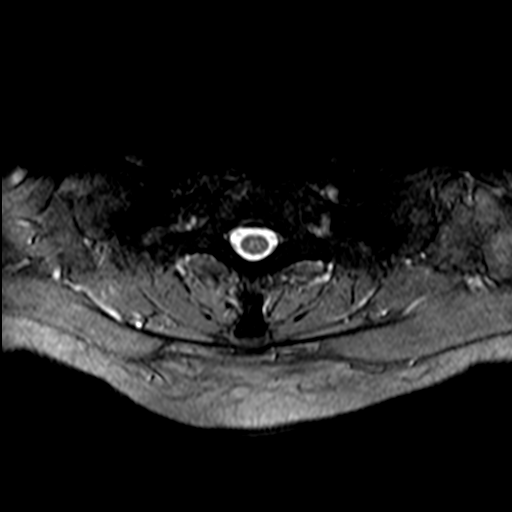
[im 5/24]
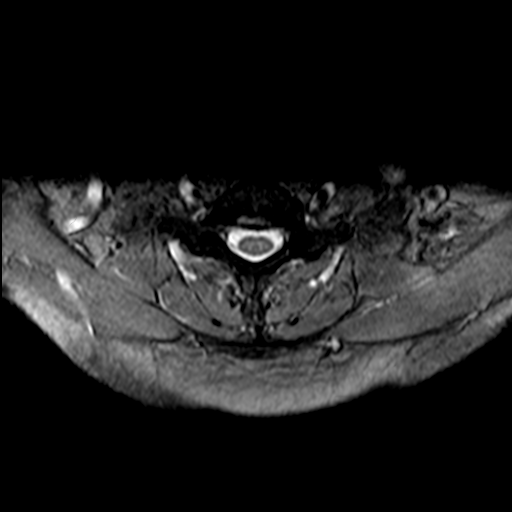
[im 7/24]
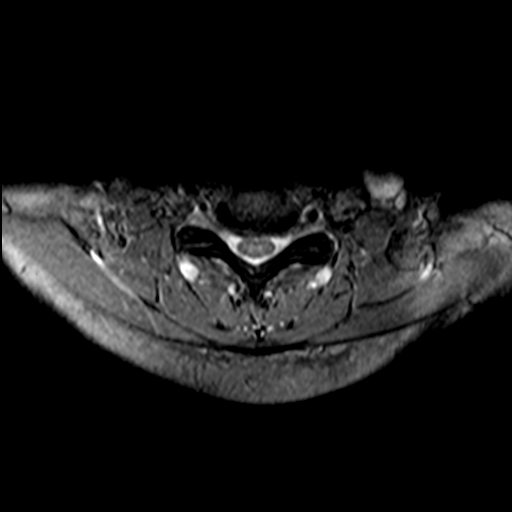

[36 of 48 positions shown; findings below may reference images not displayed]

FINDINGS: Alignment: No significant listhesis is present. Straightening of the
normal cervical lordosis is noted.

Vertebrae: Marrow signal and vertebral body heights are normal.

Cord: Normal signal and morphology.

Posterior Fossa, vertebral arteries, paraspinal tissues:
Craniocervical junction is normal. Flow is present in the vertebral
arteries bilaterally. Visualized intracranial contents are normal.

Disc levels:

C2-3: Asymmetric left-sided facet hypertrophy is present without
significant stenosis. Mild disc bulging noted.

C3-4: Broad-based disc osteophyte complex partially effaces the
ventral CSF. Uncovertebral spurring contributes to moderate right
foraminal narrowing.

C4-5: A broad-based disc osteophyte complex partially effaces the
ventral CSF. Mild foraminal narrowing is worse on the left.

C5-6: A broad-based disc osteophyte complex present. Uncovertebral
spurring contributes to mild right foraminal narrowing.

C6-7: A broad-based disc osteophyte complex partially of a scratched
at a broad-based disc osteophyte complex partially effaces the
ventral CSF. Mild left foraminal narrowing is present.

C7-T1: Negative.
IMPRESSION: 1. Multilevel spondylosis of the cervical spine as described.
2. Moderate right foraminal narrowing at C3-4.
3. Mild foraminal narrowing bilaterally at C4-5 is worse on the
left.
4. Mild right foraminal narrowing at C5-6.
5. Mild left foraminal narrowing at C6-7.

## 2022-04-26 NOTE — Progress Notes (Deleted)
NEUROLOGY FOLLOW UP OFFICE NOTE  Alice [name] [id number]  Assessment/Plan:   Left-sided occipital neuralgia Cervicalgia/cervical spondylosis Chiari I malformation    1 ***   Subjective:  Alice [name] is a 61 year old female with osteoporosis, chronic constipation and right sided breast cancer (status post chemotherapy) who follows up for headache and neck pain.   UPDATE: Referred for physical therapy.  ***    Current NSAIDS:/steroids  none Current analgesics:  Extra-strength Tylenol Current triptans:  none Current ergotamine:  none Current anti-emetic:  none Current muscle relaxants:  none Current anti-anxiolytic:  none Current sleep aide:  none Current Antihypertensive medications:  none Current Antidepressant medications:  none Current Anticonvulsant medications:  none Current anti-CGRP:  none Current Vitamins/Herbal/Supplements:  none Current Antihistamines/Decongestants:  none Other therapy:  none Hormone/birth control:  none   HISTORY: In September 2020, she started experiencing 6-7/10 aching and sharp pain in left posterior side of her neck.  It lasts a few seconds and occurs 2 to 3 times a week.  She reports tingling in the fingertips of both hands which is attributed to chemotherapy.  However, she denies radicular pain, weakness or radicular numbness in the upper extremities.  No diplopia or dysphagia.  No preceding trauma.  MRI of brain without contrast from 08/19/2018 was unremarkable except mild chronic small vessel ischemic changes and for cerebellar tonsillar ectopia extending 7 mm below the foramen magnum, consistent with a Chiari 1 malformation with no evidence of compression.  By 2021, neck pain mostly subsided but then reported moderate sharp left (rarely right) sided temporal headaches lasting seconds but occurring several times a day about 2-3 days a week.  No associated autonomic symptoms.  Due to new left-sided headache, performed MRI  brain with and without contrast and MRA head on 03/08/2020, personally reviewed, which showed nonspecific cerebral white matter signal abnormalities and unchanged 6 mm cerebellar ectopia but no acute abnormalities. Started nortriptyline for headache but it caused side effects.  They subsequently improved.  In late 2022, she reports a burning sensation in the left occipital region radiating down the left posterior neck.  Feels like a crick in the neck.  It is intermittent throughout the day.  She was prescribed a medication, possibly gabapentin, which caused drowsiness and incoordination.  MRI of cervical spine on 04/07/2021 revealed multilevel spondylosis with asymmetric left sided facet hypertrophy at C2-3 without significant stenosis,  moderate right foraminal narrowing at C3-4, mild foraminal narrowing bilaterally at C4-5, mild right foraminal narrowing at C5-6 and mild left foraminal narrowing at C6-7.   Past medication:  nortriptyline, gabapentin (side effects)  PAST MEDICAL HISTORY: Past Medical History:  Diagnosis Date   Anemia    Breast cancer ([place])    right in 1997   Family history of lung cancer    Family history of ovarian cancer    GERD (gastroesophageal reflux disease) 06/30/2018   Herpes simplex type 2 infection 02/2006   History of breast cancer 1997   History of esophagogastroduodenoscopy (EGD) 09/15/2015   small hiatal hernia was noted otherwise all was normal   Osteoporosis    Personal history of breast cancer     MEDICATIONS: Current Outpatient Medications on File Prior to Visit  Medication Sig Dispense Refill   anastrozole (ARIMIDEX) 1 MG tablet Take 1 tablet (1 mg total) by mouth daily. 90 tablet 4   Vitamin D, Ergocalciferol, (DRISDOL) 1.25 MG (50000 UNIT) CAPS capsule Take 1 capsule (50,000 Units total) by mouth 2 (two) times a week. (  Patient not taking: Reported on 03/15/2021) 24 capsule 0   No current facility-administered medications on file prior to visit.     ALLERGIES: No Known Allergies  FAMILY HISTORY: Family History  Problem Relation Age of Onset   Ovarian cancer Sister 41   Dementia Mother    Aneurysm Father       Objective:  *** General: No acute distress.  Patient appears well-groomed.   Head:  Normocephalic/atraumatic Eyes:  Fundi examined but not visualized Neck: supple, left upper paraspinal and suboccipital tenderness, full range of motion Heart:  Regular rate and rhythm Neurological Exam: ***   Metta Clines, DO  CC: [name], MD

## 2022-05-06 ENCOUNTER — Encounter: Payer: Self-pay | Admitting: Neurology

## 2022-05-06 ENCOUNTER — Ambulatory Visit: Payer: BC Managed Care – PPO | Admitting: Neurology

## 2022-05-06 DIAGNOSIS — Z029 Encounter for administrative examinations, unspecified: Secondary | ICD-10-CM

## 2022-06-10 ENCOUNTER — Telehealth: Payer: Self-pay | Admitting: *Deleted

## 2022-06-10 NOTE — Telephone Encounter (Signed)
Patient called to ask about next appt with Dr. Chryl Heck. At last appt on 06/15/21, MD wrote in note RTC in 6 months. Patient states she was not called to schedule an appt. Advised patient that schedule message sent to schedule her at her earliest convenience. Patient verbalized understanding that she will be contacted by scheduling.

## 2022-06-11 ENCOUNTER — Telehealth: Payer: Self-pay | Admitting: Hematology and Oncology

## 2022-06-11 NOTE — Telephone Encounter (Signed)
Spoke with patient confirming upcoming appointment 

## 2022-07-02 DIAGNOSIS — R109 Unspecified abdominal pain: Secondary | ICD-10-CM | POA: Diagnosis not present

## 2022-07-02 DIAGNOSIS — K5900 Constipation, unspecified: Secondary | ICD-10-CM | POA: Diagnosis not present

## 2022-07-24 DIAGNOSIS — Z6833 Body mass index (BMI) 33.0-33.9, adult: Secondary | ICD-10-CM | POA: Diagnosis not present

## 2022-07-24 DIAGNOSIS — Z124 Encounter for screening for malignant neoplasm of cervix: Secondary | ICD-10-CM | POA: Diagnosis not present

## 2022-07-24 DIAGNOSIS — Z01419 Encounter for gynecological examination (general) (routine) without abnormal findings: Secondary | ICD-10-CM | POA: Diagnosis not present

## 2022-07-24 DIAGNOSIS — Z1239 Encounter for other screening for malignant neoplasm of breast: Secondary | ICD-10-CM | POA: Diagnosis not present

## 2022-08-26 DIAGNOSIS — Z1231 Encounter for screening mammogram for malignant neoplasm of breast: Secondary | ICD-10-CM | POA: Diagnosis not present

## 2022-09-11 DIAGNOSIS — R109 Unspecified abdominal pain: Secondary | ICD-10-CM | POA: Diagnosis not present

## 2022-09-11 DIAGNOSIS — K5909 Other constipation: Secondary | ICD-10-CM | POA: Diagnosis not present

## 2022-09-11 DIAGNOSIS — K219 Gastro-esophageal reflux disease without esophagitis: Secondary | ICD-10-CM | POA: Diagnosis not present

## 2022-09-18 DIAGNOSIS — K5900 Constipation, unspecified: Secondary | ICD-10-CM | POA: Diagnosis not present

## 2022-09-18 DIAGNOSIS — D1771 Benign lipomatous neoplasm of kidney: Secondary | ICD-10-CM | POA: Diagnosis not present

## 2022-12-10 ENCOUNTER — Ambulatory Visit: Payer: BC Managed Care – PPO | Admitting: Hematology and Oncology

## 2022-12-18 ENCOUNTER — Inpatient Hospital Stay: Payer: BC Managed Care – PPO | Admitting: Hematology and Oncology

## 2022-12-18 ENCOUNTER — Telehealth: Payer: Self-pay

## 2022-12-18 NOTE — Telephone Encounter (Signed)
Pt LVM to reschedule 12/18/2022 appt. Pt rescheduled for 01/22/2023 at pt's earliest availability.

## 2023-01-22 ENCOUNTER — Ambulatory Visit: Payer: BC Managed Care – PPO | Admitting: Adult Health

## 2023-02-19 ENCOUNTER — Inpatient Hospital Stay: Payer: BC Managed Care – PPO

## 2023-02-19 ENCOUNTER — Other Ambulatory Visit: Payer: Self-pay

## 2023-02-19 ENCOUNTER — Inpatient Hospital Stay: Payer: BC Managed Care – PPO | Attending: Hematology and Oncology | Admitting: Adult Health

## 2023-02-19 VITALS — BP 108/64 | HR 72 | Temp 97.7°F | Resp 18 | Wt 170.6 lb

## 2023-02-19 DIAGNOSIS — Z79811 Long term (current) use of aromatase inhibitors: Secondary | ICD-10-CM | POA: Insufficient documentation

## 2023-02-19 DIAGNOSIS — Z170 Estrogen receptor positive status [ER+]: Secondary | ICD-10-CM

## 2023-02-19 DIAGNOSIS — E559 Vitamin D deficiency, unspecified: Secondary | ICD-10-CM

## 2023-02-19 DIAGNOSIS — Z853 Personal history of malignant neoplasm of breast: Secondary | ICD-10-CM | POA: Diagnosis not present

## 2023-02-19 DIAGNOSIS — E538 Deficiency of other specified B group vitamins: Secondary | ICD-10-CM

## 2023-02-19 DIAGNOSIS — C50411 Malignant neoplasm of upper-outer quadrant of right female breast: Secondary | ICD-10-CM | POA: Diagnosis not present

## 2023-02-19 DIAGNOSIS — N6320 Unspecified lump in the left breast, unspecified quadrant: Secondary | ICD-10-CM | POA: Diagnosis not present

## 2023-02-19 DIAGNOSIS — M810 Age-related osteoporosis without current pathological fracture: Secondary | ICD-10-CM | POA: Insufficient documentation

## 2023-02-19 LAB — CMP (CANCER CENTER ONLY)
ALT: 15 U/L (ref 0–44)
AST: 22 U/L (ref 15–41)
Albumin: 3.9 g/dL (ref 3.5–5.0)
Alkaline Phosphatase: 88 U/L (ref 38–126)
Anion gap: 4 — ABNORMAL LOW (ref 5–15)
BUN: 11 mg/dL (ref 8–23)
CO2: 28 mmol/L (ref 22–32)
Calcium: 9.3 mg/dL (ref 8.9–10.3)
Chloride: 107 mmol/L (ref 98–111)
Creatinine: 0.65 mg/dL (ref 0.44–1.00)
GFR, Estimated: >60 mL/min (ref >60)
Glucose, Bld: 88 mg/dL (ref 70–99)
Potassium: 4.2 mmol/L (ref 3.5–5.1)
Sodium: 139 mmol/L (ref 135–145)
Total Bilirubin: 0.4 mg/dL (ref 0.3–1.2)
Total Protein: 7.4 g/dL (ref 6.5–8.1)

## 2023-02-19 LAB — CBC WITH DIFFERENTIAL (CANCER CENTER ONLY)
Abs Immature Granulocytes: 0.02 10*3/uL (ref 0.00–0.07)
Basophils Absolute: 0 10*3/uL (ref 0.0–0.1)
Basophils Relative: 1 %
Eosinophils Absolute: 0.1 10*3/uL (ref 0.0–0.5)
Eosinophils Relative: 1 %
HCT: 38 % (ref 36.0–46.0)
Hemoglobin: 12.8 g/dL (ref 12.0–15.0)
Immature Granulocytes: 0 %
Lymphocytes Relative: 27 %
Lymphs Abs: 1.5 10*3/uL (ref 0.7–4.0)
MCH: 31.6 pg (ref 26.0–34.0)
MCHC: 33.7 g/dL (ref 30.0–36.0)
MCV: 93.8 fL (ref 80.0–100.0)
Monocytes Absolute: 0.4 10*3/uL (ref 0.1–1.0)
Monocytes Relative: 6 %
Neutro Abs: 3.7 10*3/uL (ref 1.7–7.7)
Neutrophils Relative %: 65 %
Platelet Count: 303 10*3/uL (ref 150–400)
RBC: 4.05 MIL/uL (ref 3.87–5.11)
RDW: 11.9 % (ref 11.5–15.5)
WBC Count: 5.8 10*3/uL (ref 4.0–10.5)
nRBC: 0 % (ref 0.0–0.2)

## 2023-02-19 LAB — VITAMIN D 25 HYDROXY (VIT D DEFICIENCY, FRACTURES): Vit D, 25-Hydroxy: 18.7 ng/mL — ABNORMAL LOW (ref 30–100)

## 2023-02-19 LAB — VITAMIN B12: Vitamin B-12: 146 pg/mL — ABNORMAL LOW (ref 180–914)

## 2023-02-19 NOTE — Assessment & Plan Note (Signed)
Alice [name] is a 62 year old woman with history of stage IIa invasive ductal carcinoma, ER/PR positive, HER2 negative diagnosed in May 2020.  She is status postmastectomy, adjuvant chemotherapy, and adjuvant antiestrogen therapy with anastrozole in October 2020.  Stage IIa invasive ductal carcinoma: She has no clinical or radiographic signs of breast cancer recurrence.  She will continue on anastrozole daily.  I recommended continued right chest wall exams along with left breast exams and annual mammogram.  We are awaiting her mammogram results from [facility] that were completed earlier this year. Status post mastectomy: I gave her orders today for postmastectomy supplies including bras and prosthetics. Bone health: She is opted to forego bone density testing as she does not wish to take any bisphosphonate therapy.  We reviewed bone health today.  We will check her calcium and vitamin D level. B12 deficiency: We will recheck a B12 level today. Health maintenance: Recommended that she continue to follow-up with her primary care provider.  I also recommended that she continue to exercise regularly.  Alice [name] will return to clinic in 6 months for follow-up with Dr. Al Pimple.

## 2023-02-19 NOTE — Progress Notes (Signed)
[facility] Cancer Follow up:    Alice Bussing, MD [address] [place] [address]   DIAGNOSIS:  Cancer Staging  Malignant neoplasm of upper-outer quadrant of right breast in female, estrogen receptor positive (HCC) Staging form: Breast, AJCC 8th Edition - Clinical stage from 09/30/2018: Stage IIA (cT2, cN0, cM0, G3, ER+, PR+, HER2-) - Unsigned Method of lymph node assessment: Clinical Histologic grading system: 3 grade system Laterality: Right Tumor size (mm): 26   SUMMARY OF ONCOLOGIC HISTORY:  (Copied from Dr. Remonia Richter last office note) [place] woman    (1) status post right lumpectomy in 1997,             (a) status post adjuvant radiation             (b) did not receive antiestrogens or chemotherapy   (2) status post right breast upper outer quadrant biopsy 09/23/2018 for a clinical T2 N0, stage IIA invasive ductal carcinoma, grade 3, estrogen and progesterone receptor positive, HER-2 not amplified, with an MIB-1 of 70%   (3) Oncotype obtained from the initial biopsy showed a score of 26 predicting a risk of recurrence outside the breast of 16/21% (depending on nodal status), if the patient's only systemic therapy is an antiestrogen for 5 years.  It also predicts a significant benefit from chemotherapy   (4) status post right mastectomy and sentinel lymph node sampling 10/22/2018 for a pT2 pN0, stage IB invasive ductal carcinoma, grade 2, with negative margins             (a) not planning reconstruction at this point   (5) adjuvant chemotherapy recommended on 11/05/2018 by Dr. Darnelle Catalan             (a) second opinion with Dr. Kristine Linea at [facility] on 11/18/2018 recommended 4 cycles of adjuvant docetaxel and cyclophosphamide              (b) Third opinion with Dr. Fatima Blank on 11/24/2018 recommended 4 cycles of adjuvant Docetaxel and Cyclophosphamide             (c) started adjuvant chemotherapy with docetaxel and cyclophosphamide on 12/23/2018,  completed 4 cycles 02/24/2019   (6) anastrozole started 09/30/2018 (patient stopped due to concerns over bone loss--to restart once adjuvant therapy has completed)             (a) Bone density on 12/10/2018 shows a T score of -3.8 in the AP spine: Osteoporosis             (b) vitamin D level 03/11/2019 less than 10   (7) genetics testing 10/06/2018 through the [facility] Common Hereditary Cancers Panel found no deleterious mutations in APC, ATM, AXIN2, BARD1, BMPR1A, BRCA1, BRCA2, BRIP1, CDH1, CDKN2A (p14ARF), CDKN2A (p16INK4a), CKD4, CHEK2, CTNNA1, DICER1, EPCAM (Deletion/duplication testing only), GREM1 (promoter region deletion/duplication testing only), KIT, MEN1, MLH1, MSH2, MSH3, MSH6, MUTYH, NBN, NF1, NHTL1, PALB2, PDGFRA, PMS2, POLD1, POLE, PTEN, RAD50, RAD51C, RAD51D, SDHB, SDHC, SDHD, SMAD4, SMARCA4. STK11, TP53, TSC1, TSC2, and VHL. The following genes were evaluated for sequence changes only: SDHA and HOXB13 c.251G>A variant only.              (a) 2 VUS's identified: VUS in APC c.-34014C>T and VHL c.340+272T>G identified   CURRENT THERAPY: Anastrozole, oral B12 supplementation  INTERVAL HISTORY: Alice [name] 62 y.o. female returns for follow-up and evaluation of her history of breast cancer.  She is doing well today.  She continues on anastrozole with good tolerance.  She underwent her mammogram in April of this year.  We do not have records of this scanned into our system.  It was completed at [facility]  Alice [name] continues to work at [employer] [place].  She sees her primary care provider regularly and tells me that she exercises regularly.  She has not undergone repeat bone density testing, she does not want to take any bisphosphonate therapy.   Patient Active Problem List   Diagnosis Date Noted   B12 deficiency anemia 12/07/2020   Anemia, macrocytic 03/09/2020   Acute midline low back pain without sciatica 01/15/2019   Genetic testing 10/06/2018   Family history of ovarian cancer     Family history of lung cancer    Personal history of breast cancer    Malignant neoplasm of upper-outer quadrant of right breast in female, estrogen receptor positive (HCC) 09/29/2018    has No Known Allergies.  MEDICAL HISTORY: Past Medical History:  Diagnosis Date   Anemia    Breast cancer (HCC)    right in 1997   Family history of lung cancer    Family history of ovarian cancer    GERD (gastroesophageal reflux disease) 06/30/2018   Herpes simplex type 2 infection 02/2006   History of breast cancer 1997   History of esophagogastroduodenoscopy (EGD) 09/15/2015   small hiatal hernia was noted otherwise all was normal   Osteoporosis    Personal history of breast cancer     SURGICAL HISTORY: Past Surgical History:  Procedure Laterality Date   BREAST ENHANCEMENT SURGERY     s/p cancer and surgery, right   BREAST LUMPECTOMY     right due to breast cancer   MASTECTOMY W/ SENTINEL NODE BIOPSY Right 10/22/2018   Procedure: RIGHT MASTECTOMY WITH RIGHT AXILLARY SENTINEL LYMPH NODE BIOPSY;  Surgeon: Emelia Loron, MD;  Location: [facility];  Service: General;  Laterality: Right;    SOCIAL HISTORY: Social History   Socioeconomic History   Marital status: Married    Spouse name: Not on file   Number of children: 0   Years of education: Not on file   Highest education level: Some college, no degree  Occupational History   Not on file  Tobacco Use   Smoking status: Never   Smokeless tobacco: Never  Vaping Use   Vaping status: Never Used  Substance and Sexual Activity   Alcohol use: No    Alcohol/week: 0.0 standard drinks of alcohol   Drug use: No   Sexual activity: Not Currently  Other Topics Concern   Not on file  Social History Narrative   Married lives with spouse in a two story home   No children   Some college   Right handed   Does drink tea 1-2 a week green tea, no soda, no coffee   Social Determinants of Corporate investment banker  Strain: Not on file  Food Insecurity: Not on file  Transportation Needs: Not on file  Physical Activity: Not on file  Stress: Not on file  Social Connections: Unknown (08/28/2021)   Received from [facility]   Social Network    Social Network: Not on file  Intimate Partner Violence: Unknown (07/31/2021)   Received from [facility]   HITS    Physically Hurt: Not on file    Insult or Talk Down To: Not on file    Threaten Physical Harm: Not on file    Scream or Curse: Not on file  FAMILY HISTORY: Family History  Problem Relation Age of Onset   Ovarian cancer Sister 8   Dementia Mother    Aneurysm Father     Review of Systems  Constitutional:  Negative for appetite change, chills, fatigue, fever and unexpected weight change.  HENT:   Negative for hearing loss, lump/mass and trouble swallowing.   Eyes:  Negative for eye problems and icterus.  Respiratory:  Negative for chest tightness, cough and shortness of breath.   Cardiovascular:  Negative for chest pain, leg swelling and palpitations.  Gastrointestinal:  Negative for abdominal distention, abdominal pain, constipation, diarrhea, nausea and vomiting.  Endocrine: Negative for hot flashes.  Genitourinary:  Negative for difficulty urinating.   Musculoskeletal:  Negative for arthralgias.  Skin:  Negative for itching and rash.  Neurological:  Negative for dizziness, extremity weakness, headaches and numbness.  Hematological:  Negative for adenopathy. Does not bruise/bleed easily.  Psychiatric/Behavioral:  Negative for depression. The patient is not nervous/anxious.       PHYSICAL EXAMINATION    Vitals:   02/19/23 1038  BP: 108/64  Pulse: 72  Resp: 18  Temp: 97.7 F (36.5 C)  SpO2: 100%    Physical Exam Constitutional:      General: She is not in acute distress.    Appearance: Normal appearance. She is not toxic-appearing.  HENT:     Head: Normocephalic and atraumatic.      Mouth/Throat:     Mouth: Mucous membranes are moist.     Pharynx: Oropharynx is clear. No oropharyngeal exudate or posterior oropharyngeal erythema.  Eyes:     General: No scleral icterus. Cardiovascular:     Rate and Rhythm: Normal rate and regular rhythm.     Pulses: Normal pulses.     Heart sounds: Normal heart sounds.  Pulmonary:     Effort: Pulmonary effort is normal.     Breath sounds: Normal breath sounds.  Chest:     Comments: Her right breast is status postmastectomy no sign of local recurrence left breast is benign. Abdominal:     General: Abdomen is flat. Bowel sounds are normal. There is no distension.     Palpations: Abdomen is soft.     Tenderness: There is no abdominal tenderness.  Musculoskeletal:        General: No swelling.     Cervical back: Neck supple.  Lymphadenopathy:     Cervical: No cervical adenopathy.     Upper Body:     Right upper body: No axillary adenopathy.     Left upper body: No axillary adenopathy.  Skin:    General: Skin is warm and dry.     Findings: No rash.  Neurological:     General: No focal deficit present.     Mental Status: She is alert.  Psychiatric:        Mood and Affect: Mood normal.        Behavior: Behavior normal.        ASSESSMENT and THERAPY PLAN:   Malignant neoplasm of upper-outer quadrant of right breast in female, estrogen receptor positive (HCC) Alice [name] is a 62 year old woman with history of stage IIa invasive ductal carcinoma, ER/PR positive, HER2 negative diagnosed in May 2020.  She is status postmastectomy, adjuvant chemotherapy, and adjuvant antiestrogen therapy with anastrozole in October 2020.  Stage IIa invasive ductal carcinoma: She has no clinical or radiographic signs of breast cancer recurrence.  She will continue on anastrozole daily.  I recommended continued right chest wall exams along with  left breast exams and annual mammogram.  We are awaiting her mammogram results from [facility] that were completed  earlier this year. Status post mastectomy: I gave her orders today for postmastectomy supplies including bras and prosthetics. Bone health: She is opted to forego bone density testing as she does not wish to take any bisphosphonate therapy.  We reviewed bone health today.  We will check her calcium and vitamin D level. B12 deficiency: We will recheck a B12 level today. Health maintenance: Recommended that she continue to follow-up with her primary care provider.  I also recommended that she continue to exercise regularly.  Alice [name] will return to clinic in 6 months for follow-up with Dr. Al Pimple.  All questions were answered. The patient knows to call the clinic with any problems, questions or concerns. We can certainly see the patient much sooner if necessary.  Total encounter time:20 minutes*in face-to-face visit time, chart review, lab review, care coordination, order entry, and documentation of the encounter time.    Lillard Anes, NP 02/19/23 11:44 AM Medical Oncology and Hematology [facility] [address] [place], [place] [address] Tel. [phone]    Fax. [phone]  *Total Encounter Time as defined by the Centers for Medicare and Medicaid Services includes, in addition to the face-to-face time of a patient visit (documented in the note above) non-face-to-face time: obtaining and reviewing outside history, ordering and reviewing medications, tests or procedures, care coordination (communications with other health care professionals or caregivers) and documentation in the medical record.

## 2023-02-21 ENCOUNTER — Other Ambulatory Visit: Payer: Self-pay | Admitting: Adult Health

## 2023-02-21 ENCOUNTER — Telehealth: Payer: Self-pay | Admitting: *Deleted

## 2023-02-21 MED ORDER — VITAMIN D (ERGOCALCIFEROL) 1.25 MG (50000 UNIT) PO CAPS: 50000.0000 [IU] | ORAL_CAPSULE | ORAL | 1 refills | Status: DC

## 2023-02-21 NOTE — Telephone Encounter (Signed)
-----   Message from Noreene Filbert sent at 02/20/2023  9:26 PM EDT ----- B12 and vitamin d level are low.  I would recommend prescription vitamin d supplementation and b12 injections.  Please give patient these recommendations and let me know how she would like to proceed. ----- Message ----- From: Leory Plowman, [facility] [place] Sent: 02/19/2023  11:38 AM EDT To: Loa Socks, NP

## 2023-02-21 NOTE — Telephone Encounter (Signed)
RN placed call to pt with below recommendations, pt agreeable to NP plan.  NP notified to send in prescription and order B12 injections.  High priority message sent to scheduling team to schedule injection appts.

## 2023-02-25 ENCOUNTER — Encounter: Payer: Self-pay | Admitting: Adult Health

## 2023-02-25 ENCOUNTER — Other Ambulatory Visit: Payer: Self-pay | Admitting: *Deleted

## 2023-02-26 ENCOUNTER — Other Ambulatory Visit: Payer: Self-pay | Admitting: *Deleted

## 2023-02-26 ENCOUNTER — Telehealth: Payer: Self-pay | Admitting: Hematology and Oncology

## 2023-02-26 NOTE — Telephone Encounter (Signed)
Patient is aware of scheduled appointment times/dates for follow up

## 2023-02-28 ENCOUNTER — Ambulatory Visit: Payer: BC Managed Care – PPO

## 2023-03-05 ENCOUNTER — Inpatient Hospital Stay: Payer: BC Managed Care – PPO | Attending: Hematology and Oncology

## 2023-03-05 VITALS — BP 134/95 | HR 91 | Temp 98.6°F | Resp 18

## 2023-03-05 DIAGNOSIS — E538 Deficiency of other specified B group vitamins: Secondary | ICD-10-CM | POA: Diagnosis not present

## 2023-03-05 DIAGNOSIS — D519 Vitamin B12 deficiency anemia, unspecified: Secondary | ICD-10-CM

## 2023-03-05 MED ORDER — CYANOCOBALAMIN 1000 MCG/ML IJ SOLN
1000.0000 ug | Freq: Once | INTRAMUSCULAR | Status: AC
  Administered 2023-03-05: 1000 ug via INTRAMUSCULAR
  Filled 2023-03-05: qty 1

## 2023-03-06 DIAGNOSIS — M25562 Pain in left knee: Secondary | ICD-10-CM | POA: Diagnosis not present

## 2023-03-12 ENCOUNTER — Inpatient Hospital Stay: Payer: BC Managed Care – PPO

## 2023-03-13 ENCOUNTER — Inpatient Hospital Stay: Payer: BC Managed Care – PPO

## 2023-03-19 ENCOUNTER — Ambulatory Visit: Payer: BC Managed Care – PPO

## 2023-03-20 ENCOUNTER — Inpatient Hospital Stay: Payer: BC Managed Care – PPO

## 2023-03-20 VITALS — BP 128/78 | HR 76 | Temp 98.0°F | Resp 18

## 2023-03-20 DIAGNOSIS — D519 Vitamin B12 deficiency anemia, unspecified: Secondary | ICD-10-CM

## 2023-03-20 DIAGNOSIS — E538 Deficiency of other specified B group vitamins: Secondary | ICD-10-CM | POA: Diagnosis not present

## 2023-03-20 MED ORDER — CYANOCOBALAMIN 1000 MCG/ML IJ SOLN
1000.0000 ug | Freq: Once | INTRAMUSCULAR | Status: AC
  Administered 2023-03-20: 1000 ug via INTRAMUSCULAR
  Filled 2023-03-20: qty 1

## 2023-03-20 NOTE — Patient Instructions (Signed)
 Vitamin B12 Injection What is this medication? Vitamin B12 (VAHY tuh min B12) prevents and treats low vitamin B12 levels in your body. It is used in people who do not get enough vitamin B12 from their diet or when their digestive tract does not absorb enough. Vitamin B12 plays an important role in maintaining the health of your nervous system and red blood cells. This medicine may be used for other purposes; ask your health care provider or pharmacist if you have questions. COMMON BRAND NAME(S): B-12 Compliance Kit, B-12 Injection Kit, Cyomin, Dodex, LA-12, Nutri-Twelve, Physicians EZ Use B-12, Primabalt, Vitamin Deficiency Injectable System - B12 What should I tell my care team before I take this medication? They need to know if you have any of these conditions: Kidney disease Leber's disease Megaloblastic anemia An unusual or allergic reaction to cyanocobalamin, cobalt, other medications, foods, dyes, or preservatives Pregnant or trying to get pregnant Breast-feeding How should I use this medication? This medication is injected into a muscle or deeply under the skin. It is usually given in a clinic or care team's office. However, your care team may teach you how to inject yourself. Follow all instructions. Talk to your care team about the use of this medication in children. Special care may be needed. Overdosage: If you think you have taken too much of this medicine contact a poison control center or emergency room at once. NOTE: This medicine is only for you. Do not share this medicine with others. What if I miss a dose? If you are given your dose at a clinic or care team's office, call to reschedule your appointment. If you give your own injections, and you miss a dose, take it as soon as you can. If it is almost time for your next dose, take only that dose. Do not take double or extra doses. What may interact with this medication? Alcohol Colchicine This list may not describe all possible  interactions. Give your health care provider a list of all the medicines, herbs, non-prescription drugs, or dietary supplements you use. Also tell them if you smoke, drink alcohol, or use illegal drugs. Some items may interact with your medicine. What should I watch for while using this medication? Visit your care team regularly. You may need blood work done while you are taking this medication. You may need to follow a special diet. Talk to your care team. Limit your alcohol intake and avoid smoking to get the best benefit. What side effects may I notice from receiving this medication? Side effects that you should report to your care team as soon as possible: Allergic reactions--skin rash, itching, hives, swelling of the face, lips, tongue, or throat Swelling of the ankles, hands, or feet Trouble breathing Side effects that usually do not require medical attention (report to your care team if they continue or are bothersome): Diarrhea This list may not describe all possible side effects. Call your doctor for medical advice about side effects. You may report side effects to FDA at 1-800-FDA-1088. Where should I keep my medication? Keep out of the reach of children. Store at room temperature between 15 and 30 degrees C (59 and 85 degrees F). Protect from light. Throw away any unused medication after the expiration date. NOTE: This sheet is a summary. It may not cover all possible information. If you have questions about this medicine, talk to your doctor, pharmacist, or health care provider.  2024 Elsevier/Gold Standard (2020-12-26 00:00:00)

## 2023-03-21 DIAGNOSIS — R928 Other abnormal and inconclusive findings on diagnostic imaging of breast: Secondary | ICD-10-CM | POA: Diagnosis not present

## 2023-03-21 DIAGNOSIS — N6322 Unspecified lump in the left breast, upper inner quadrant: Secondary | ICD-10-CM | POA: Diagnosis not present

## 2023-03-21 DIAGNOSIS — N644 Mastodynia: Secondary | ICD-10-CM | POA: Diagnosis not present

## 2023-03-21 DIAGNOSIS — N6325 Unspecified lump in the left breast, overlapping quadrants: Secondary | ICD-10-CM | POA: Diagnosis not present

## 2023-03-25 ENCOUNTER — Inpatient Hospital Stay: Payer: BC Managed Care – PPO

## 2023-03-26 ENCOUNTER — Inpatient Hospital Stay: Payer: BC Managed Care – PPO

## 2023-03-26 ENCOUNTER — Ambulatory Visit: Payer: BC Managed Care – PPO

## 2023-04-03 ENCOUNTER — Inpatient Hospital Stay: Payer: BC Managed Care – PPO

## 2023-04-08 ENCOUNTER — Telehealth: Payer: Self-pay | Admitting: *Deleted

## 2023-04-08 NOTE — Telephone Encounter (Addendum)
  This RN spoke with pt- She states she would like  to maintain her breast cancer history care here every 6 months but just wants to get the B12 injections locally now that she has moved to [facility]-   This RN discussed above with pt that B12 injections can be managed by a primary MD- she will locate a new primary and let this RN know so her records can be sent.  No further needs at this time.   ---- Message from [facility] sent at 04/03/2023  3:57 PM EST ----- Regarding: RE: Patient Transfer of Care Yes, I believe she will need a referral out. ----- Message ----- From: Rachel Moulds, MD Sent: 04/03/2023   2:58 PM EST To: Doneta Public; [place], RN Subject: RE: Patient Transfer of Care                   Sure, does she need a referral to this facility.  Thanks, ----- Message ----- From: Doneta Public Sent: 04/03/2023   2:34 PM EST To: Rachel Moulds, MD Subject: Patient Transfer of Care                       Hi Dr Al Pimple,  Patient reached out to me yesterday and wanted to cancel all of their B12 injections with the [facility] office as she moved to [place] and it is a very long drive for her. She said that she was interested in transferring her care to [facility] in [place], or another [facility] facility that was closer to her for her B12 injections.  Thank you so much!  Sincerely,  Amber H. Patient Access Specialist [facility]

## 2023-04-25 ENCOUNTER — Inpatient Hospital Stay: Payer: BC Managed Care – PPO | Attending: Hematology and Oncology

## 2023-04-25 ENCOUNTER — Ambulatory Visit: Payer: BC Managed Care – PPO

## 2023-05-21 ENCOUNTER — Ambulatory Visit: Payer: BC Managed Care – PPO

## 2023-05-21 ENCOUNTER — Inpatient Hospital Stay: Payer: BC Managed Care – PPO

## 2023-05-23 ENCOUNTER — Ambulatory Visit: Payer: BC Managed Care – PPO

## 2023-06-18 ENCOUNTER — Inpatient Hospital Stay: Payer: BC Managed Care – PPO

## 2023-06-18 ENCOUNTER — Ambulatory Visit: Payer: BC Managed Care – PPO

## 2023-07-16 ENCOUNTER — Ambulatory Visit: Payer: BC Managed Care – PPO

## 2023-07-16 ENCOUNTER — Inpatient Hospital Stay: Payer: BC Managed Care – PPO

## 2023-08-20 ENCOUNTER — Other Ambulatory Visit: Payer: Self-pay | Admitting: Hematology and Oncology

## 2023-08-20 DIAGNOSIS — D519 Vitamin B12 deficiency anemia, unspecified: Secondary | ICD-10-CM

## 2023-08-20 DIAGNOSIS — Z170 Estrogen receptor positive status [ER+]: Secondary | ICD-10-CM

## 2023-08-20 NOTE — Progress Notes (Deleted)
 [facility] Cancer Follow up:    Alice Pinal, MD [address] [place] [address]   DIAGNOSIS:  Cancer Staging  Malignant neoplasm of upper-outer quadrant of right breast in female, estrogen receptor positive (HCC) Staging form: Breast, AJCC 8th Edition - Clinical stage from 09/30/2018: Stage IIA (cT2, cN0, cM0, G3, ER+, PR+, HER2-) - Unsigned Method of lymph node assessment: Clinical Histologic grading system: 3 grade system Laterality: Right Tumor size (mm): 26   SUMMARY OF ONCOLOGIC HISTORY:  (Copied from Dr. Lorella Roles last office note) Alice [name] woman    (1) status post right lumpectomy in 1997,             (a) status post adjuvant radiation             (b) did not receive antiestrogens or chemotherapy   (2) status post right breast upper outer quadrant biopsy 09/23/2018 for a clinical T2 N0, stage IIA invasive ductal carcinoma, grade 3, estrogen and progesterone receptor positive, HER-2 not amplified, with an MIB-1 of 70%   (3) Oncotype obtained from the initial biopsy showed a score of 26 predicting a risk of recurrence outside the breast of 16/21% (depending on nodal status), if the patient's only systemic therapy is an antiestrogen for 5 years.  It also predicts a significant benefit from chemotherapy   (4) status post right mastectomy and sentinel lymph node sampling 10/22/2018 for a pT2 pN0, stage IB invasive ductal carcinoma, grade 2, with negative margins             (a) not planning reconstruction at this point   (5) adjuvant chemotherapy recommended on 11/05/2018 by Dr. Charolett Copes             (a) second opinion with Dr. Barbra Ley at [facility] on 11/18/2018 recommended 4 cycles of adjuvant docetaxel  and cyclophosphamide               (b) Third opinion with Dr. Nonie Beady on 11/24/2018 recommended 4 cycles of adjuvant Docetaxel  and Cyclophosphamide              (c) started adjuvant chemotherapy with docetaxel  and cyclophosphamide  on 12/23/2018,  completed 4 cycles 02/24/2019   (6) anastrozole  started 09/30/2018 (patient stopped due to concerns over bone loss--to restart once adjuvant therapy has completed)             (a) Bone density on 12/10/2018 shows a T score of -3.8 in the AP spine: Osteoporosis             (b) vitamin D  level 03/11/2019 less than 10   (7) genetics testing 10/06/2018 through the [facility] Common Hereditary Cancers Panel found no deleterious mutations in APC, ATM, AXIN2, BARD1, BMPR1A, BRCA1, BRCA2, BRIP1, CDH1, CDKN2A (p14ARF), CDKN2A (p16INK4a), CKD4, CHEK2, CTNNA1, DICER1, EPCAM (Deletion/duplication testing only), GREM1 (promoter region deletion/duplication testing only), KIT, MEN1, MLH1, MSH2, MSH3, MSH6, MUTYH, NBN, NF1, NHTL1, PALB2, PDGFRA, PMS2, POLD1, POLE, PTEN, RAD50, RAD51C, RAD51D, SDHB, SDHC, SDHD, SMAD4, SMARCA4. STK11, TP53, TSC1, TSC2, and VHL. The following genes were evaluated for sequence changes only: SDHA and HOXB13 c.251G>A variant only.              (a) 2 VUS's identified: VUS in APC c.-34014C>T and VHL c.340+272T>G identified   CURRENT THERAPY: Anastrozole , oral B12 supplementation  INTERVAL HISTORY: Alice [name] 63 y.o. female returns for follow-up and evaluation of her history of breast cancer while on anastrozole .    Patient Active Problem List   Diagnosis Date Noted  B12 deficiency anemia 12/07/2020   Anemia, macrocytic 03/09/2020   Acute midline low back pain without sciatica 01/15/2019   Genetic testing 10/06/2018   Family history of ovarian cancer    Family history of lung cancer    Personal history of breast cancer    Malignant neoplasm of upper-outer quadrant of right breast in female, estrogen receptor positive (HCC) 09/29/2018    has no known allergies.  MEDICAL HISTORY: Past Medical History:  Diagnosis Date   Anemia    Breast cancer (HCC)    right in 1997   Family history of lung cancer    Family history of ovarian cancer    GERD (gastroesophageal  reflux disease) 06/30/2018   Herpes simplex type 2 infection 02/2006   History of breast cancer 1997   History of esophagogastroduodenoscopy (EGD) 09/15/2015   small hiatal hernia was noted otherwise all was normal   Osteoporosis    Personal history of breast cancer     SURGICAL HISTORY: Past Surgical History:  Procedure Laterality Date   BREAST ENHANCEMENT SURGERY     s/p cancer and surgery, right   BREAST LUMPECTOMY     right due to breast cancer   MASTECTOMY W/ SENTINEL NODE BIOPSY Right 10/22/2018   Procedure: RIGHT MASTECTOMY WITH RIGHT AXILLARY SENTINEL LYMPH NODE BIOPSY;  Surgeon: Enid Harry, MD;  Location: [facility];  Service: General;  Laterality: Right;    SOCIAL HISTORY: Social History   Socioeconomic History   Marital status: Married    Spouse name: Not on file   Number of children: 0   Years of education: Not on file   Highest education level: Some college, no degree  Occupational History   Not on file  Tobacco Use   Smoking status: Never   Smokeless tobacco: Never  Vaping Use   Vaping status: Never Used  Substance and Sexual Activity   Alcohol use: No    Alcohol/week: 0.0 standard drinks of alcohol   Drug use: No   Sexual activity: Not Currently  Other Topics Concern   Not on file  Social History Narrative   Married lives with spouse in a two story home   No children   Some college   Right handed   Does drink tea 1-2 a week green tea, no soda, no coffee   Social Drivers of Corporate investment banker Strain: Not on file  Food Insecurity: Not on file  Transportation Needs: Not on file  Physical Activity: Not on file  Stress: Not on file  Social Connections: Unknown (08/28/2021)   Received from [facility], [facility]   Social Network    Social Network: Not on file  Intimate Partner Violence: Unknown (07/31/2021)   Received from [facility], [facility]   HITS    Physically Hurt: Not on file    Insult or Talk  Down To: Not on file    Threaten Physical Harm: Not on file    Scream or Curse: Not on file    FAMILY HISTORY: Family History  Problem Relation Age of Onset   Ovarian cancer Sister 48   Dementia Mother    Aneurysm Father     Review of Systems  Constitutional:  Negative for appetite change, chills, fatigue, fever and unexpected weight change.  HENT:   Negative for hearing loss, lump/mass and trouble swallowing.   Eyes:  Negative for eye problems and icterus.  Respiratory:  Negative for chest tightness, cough and shortness of breath.  Cardiovascular:  Negative for chest pain, leg swelling and palpitations.  Gastrointestinal:  Negative for abdominal distention, abdominal pain, constipation, diarrhea, nausea and vomiting.  Endocrine: Negative for hot flashes.  Genitourinary:  Negative for difficulty urinating.   Musculoskeletal:  Negative for arthralgias.  Skin:  Negative for itching and rash.  Neurological:  Negative for dizziness, extremity weakness, headaches and numbness.  Hematological:  Negative for adenopathy. Does not bruise/bleed easily.  Psychiatric/Behavioral:  Negative for depression. The patient is not nervous/anxious.       PHYSICAL EXAMINATION    There were no vitals filed for this visit.   Physical Exam Constitutional:      General: She is not in acute distress.    Appearance: Normal appearance. She is not toxic-appearing.  HENT:     Head: Normocephalic and atraumatic.     Mouth/Throat:     Mouth: Mucous membranes are moist.     Pharynx: Oropharynx is clear. No oropharyngeal exudate or posterior oropharyngeal erythema.  Eyes:     General: No scleral icterus. Cardiovascular:     Rate and Rhythm: Normal rate and regular rhythm.     Pulses: Normal pulses.     Heart sounds: Normal heart sounds.  Pulmonary:     Effort: Pulmonary effort is normal.     Breath sounds: Normal breath sounds.  Chest:     Comments: Her right breast is status postmastectomy no  sign of local recurrence left breast is benign. Abdominal:     General: Abdomen is flat. Bowel sounds are normal. There is no distension.     Palpations: Abdomen is soft.     Tenderness: There is no abdominal tenderness.  Musculoskeletal:        General: No swelling.     Cervical back: Neck supple.  Lymphadenopathy:     Cervical: No cervical adenopathy.     Upper Body:     Right upper body: No axillary adenopathy.     Left upper body: No axillary adenopathy.  Skin:    General: Skin is warm and dry.     Findings: No rash.  Neurological:     General: No focal deficit present.     Mental Status: She is alert.  Psychiatric:        Mood and Affect: Mood normal.        Behavior: Behavior normal.        ASSESSMENT and THERAPY PLAN:   No problem-specific Assessment & Plan notes found for this encounter.   All questions were answered. The patient knows to call the clinic with any problems, questions or concerns. We can certainly see the patient much sooner if necessary.  Total encounter time:20 minutes*in face-to-face visit time, chart review, lab review, care coordination, order entry, and documentation of the encounter time.   *Total Encounter Time as defined by the Centers for Medicare and Medicaid Services includes, in addition to the face-to-face time of a patient visit (documented in the note above) non-face-to-face time: obtaining and reviewing outside history, ordering and reviewing medications, tests or procedures, care coordination (communications with other health care professionals or caregivers) and documentation in the medical record.

## 2023-08-20 NOTE — Assessment & Plan Note (Deleted)
 Alice [name] is a 63 year old woman with history of stage IIa invasive ductal carcinoma, ER/PR positive, HER2 negative diagnosed in May 2020.  She is status postmastectomy, adjuvant chemotherapy, and adjuvant antiestrogen therapy with anastrozole  in October 2020.

## 2023-08-21 ENCOUNTER — Inpatient Hospital Stay: Payer: BC Managed Care – PPO

## 2023-08-21 ENCOUNTER — Inpatient Hospital Stay: Payer: BC Managed Care – PPO | Attending: Hematology and Oncology | Admitting: Hematology and Oncology

## 2023-08-21 DIAGNOSIS — Z170 Estrogen receptor positive status [ER+]: Secondary | ICD-10-CM

## 2023-09-03 ENCOUNTER — Other Ambulatory Visit: Payer: Self-pay

## 2023-09-03 ENCOUNTER — Telehealth: Payer: Self-pay

## 2023-09-03 ENCOUNTER — Other Ambulatory Visit: Payer: Self-pay | Admitting: Adult Health

## 2023-09-03 DIAGNOSIS — D519 Vitamin B12 deficiency anemia, unspecified: Secondary | ICD-10-CM

## 2023-09-03 DIAGNOSIS — E559 Vitamin D deficiency, unspecified: Secondary | ICD-10-CM

## 2023-09-03 DIAGNOSIS — C50411 Malignant neoplasm of upper-outer quadrant of right female breast: Secondary | ICD-10-CM

## 2023-09-03 DIAGNOSIS — E538 Deficiency of other specified B group vitamins: Secondary | ICD-10-CM

## 2023-09-03 NOTE — Telephone Encounter (Signed)
 S/w pt about no show to 4/24 appts. She reports she has moved to [place], [place] and prefers to do all of her [place] appts in one day.  She was offered appt for this month but declined stating it is too far for her to drive.  She has GYN appt scheduled 6/26 and asked if she can come that day. She is scheduled with us  for labs and NP visit that day. She understands importance of keeping this appt.   She is requesting a cranial prosthesis and bra order to [facility]

## 2023-10-23 ENCOUNTER — Encounter: Payer: Self-pay | Admitting: Adult Health

## 2023-10-23 ENCOUNTER — Inpatient Hospital Stay: Attending: Hematology and Oncology

## 2023-10-23 ENCOUNTER — Inpatient Hospital Stay: Admitting: Adult Health

## 2023-10-23 VITALS — BP 141/79 | HR 80 | Temp 98.3°F | Resp 16 | Wt 221.8 lb

## 2023-10-23 DIAGNOSIS — Z170 Estrogen receptor positive status [ER+]: Secondary | ICD-10-CM | POA: Insufficient documentation

## 2023-10-23 DIAGNOSIS — E538 Deficiency of other specified B group vitamins: Secondary | ICD-10-CM | POA: Diagnosis not present

## 2023-10-23 DIAGNOSIS — Z79811 Long term (current) use of aromatase inhibitors: Secondary | ICD-10-CM | POA: Insufficient documentation

## 2023-10-23 DIAGNOSIS — C50411 Malignant neoplasm of upper-outer quadrant of right female breast: Secondary | ICD-10-CM

## 2023-10-23 DIAGNOSIS — E559 Vitamin D deficiency, unspecified: Secondary | ICD-10-CM | POA: Insufficient documentation

## 2023-10-23 DIAGNOSIS — D519 Vitamin B12 deficiency anemia, unspecified: Secondary | ICD-10-CM

## 2023-10-23 DIAGNOSIS — M810 Age-related osteoporosis without current pathological fracture: Secondary | ICD-10-CM | POA: Insufficient documentation

## 2023-10-23 LAB — CBC WITH DIFFERENTIAL (CANCER CENTER ONLY)
Abs Immature Granulocytes: 0.01 10*3/uL (ref 0.00–0.07)
Basophils Absolute: 0 10*3/uL (ref 0.0–0.1)
Basophils Relative: 1 %
Eosinophils Absolute: 0 10*3/uL (ref 0.0–0.5)
Eosinophils Relative: 1 %
HCT: 37 % (ref 36.0–46.0)
Hemoglobin: 12.6 g/dL (ref 12.0–15.0)
Immature Granulocytes: 0 %
Lymphocytes Relative: 26 %
Lymphs Abs: 1.5 10*3/uL (ref 0.7–4.0)
MCH: 31.3 pg (ref 26.0–34.0)
MCHC: 34.1 g/dL (ref 30.0–36.0)
MCV: 91.8 fL (ref 80.0–100.0)
Monocytes Absolute: 0.4 10*3/uL (ref 0.1–1.0)
Monocytes Relative: 8 %
Neutro Abs: 3.6 10*3/uL (ref 1.7–7.7)
Neutrophils Relative %: 64 %
Platelet Count: 289 10*3/uL (ref 150–400)
RBC: 4.03 MIL/uL (ref 3.87–5.11)
RDW: 12.6 % (ref 11.5–15.5)
WBC Count: 5.5 10*3/uL (ref 4.0–10.5)
nRBC: 0 % (ref 0.0–0.2)

## 2023-10-23 LAB — VITAMIN D 25 HYDROXY (VIT D DEFICIENCY, FRACTURES): Vit D, 25-Hydroxy: 25.39 ng/mL — ABNORMAL LOW (ref 30–100)

## 2023-10-23 LAB — CMP (CANCER CENTER ONLY)
ALT: 11 U/L (ref 0–44)
AST: 21 U/L (ref 15–41)
Albumin: 4 g/dL (ref 3.5–5.0)
Alkaline Phosphatase: 91 U/L (ref 38–126)
Anion gap: 6 (ref 5–15)
BUN: 5 mg/dL — ABNORMAL LOW (ref 8–23)
CO2: 26 mmol/L (ref 22–32)
Calcium: 9.5 mg/dL (ref 8.9–10.3)
Chloride: 107 mmol/L (ref 98–111)
Creatinine: 0.66 mg/dL (ref 0.44–1.00)
GFR, Estimated: >60 mL/min (ref >60)
Glucose, Bld: 87 mg/dL (ref 70–99)
Potassium: 4.1 mmol/L (ref 3.5–5.1)
Sodium: 139 mmol/L (ref 135–145)
Total Bilirubin: 0.5 mg/dL (ref 0.0–1.2)
Total Protein: 7.5 g/dL (ref 6.5–8.1)

## 2023-10-23 LAB — VITAMIN B12: Vitamin B-12: 156 pg/mL — ABNORMAL LOW (ref 180–914)

## 2023-10-23 MED ORDER — ANASTROZOLE 1 MG PO TABS
1.0000 mg | ORAL_TABLET | Freq: Every day | ORAL | 4 refills | Status: AC
Start: 2023-10-23 — End: ?

## 2023-10-23 NOTE — Progress Notes (Signed)
 [facility] Cancer Follow up:    Regino Slater, MD [address] [place] [address]   DIAGNOSIS: Cancer Staging  Malignant neoplasm of upper-outer quadrant of right breast in female, estrogen receptor positive (HCC) Staging form: Breast, AJCC 8th Edition - Clinical stage from 09/30/2018: Stage IIA (cT2, cN0, cM0, G3, ER+, PR+, HER2-) - Signed by Loretha Ash, MD on 08/20/2023 Method of lymph node assessment: Clinical Histologic grading system: 3 grade system Laterality: Right Tumor size (mm): 26    SUMMARY OF ONCOLOGIC HISTORY:  (Copied from Dr. Lianne last office note)  woman    (1) status post right lumpectomy in 1997,             (a) status post adjuvant radiation             (b) did not receive antiestrogens or chemotherapy   (2) status post right breast upper outer quadrant biopsy 09/23/2018 for a clinical T2 N0, stage IIA invasive ductal carcinoma, grade 3, estrogen and progesterone receptor positive, HER-2 not amplified, with an MIB-1 of 70%   (3) Oncotype obtained from the initial biopsy showed a score of 26 predicting a risk of recurrence outside the breast of 16/21% (depending on nodal status), if the patient's only systemic therapy is an antiestrogen for 5 years.  It also predicts a significant benefit from chemotherapy   (4) status post right mastectomy and sentinel lymph node sampling 10/22/2018 for a pT2 pN0, stage IB invasive ductal carcinoma, grade 2, with negative margins             (a) not planning reconstruction at this point   (5) adjuvant chemotherapy recommended on 11/05/2018 by Dr. Layla             (a) second opinion with Dr. Virgel at [facility] on 11/18/2018 recommended 4 cycles of adjuvant docetaxel  and cyclophosphamide               (b) Third opinion with Dr. Debby Dess on 11/24/2018 recommended 4 cycles of adjuvant Docetaxel  and Cyclophosphamide              (c) started adjuvant chemotherapy with docetaxel  and  cyclophosphamide  on 12/23/2018, completed 4 cycles 02/24/2019   (6) anastrozole  started 09/30/2018 (patient stopped due to concerns over bone loss--to restart once adjuvant therapy has completed)             (a) Bone density on 12/10/2018 shows a T score of -3.8 in the AP spine: Osteoporosis             (b) vitamin D  level 03/11/2019 less than 10   (7) genetics testing 10/06/2018 through the [facility] Common Hereditary Cancers Panel found no deleterious mutations in APC, ATM, AXIN2, BARD1, BMPR1A, BRCA1, BRCA2, BRIP1, CDH1, CDKN2A (p14ARF), CDKN2A (p16INK4a), CKD4, CHEK2, CTNNA1, DICER1, EPCAM (Deletion/duplication testing only), GREM1 (promoter region deletion/duplication testing only), KIT, MEN1, MLH1, MSH2, MSH3, MSH6, MUTYH, NBN, NF1, NHTL1, PALB2, PDGFRA, PMS2, POLD1, POLE, PTEN, RAD50, RAD51C, RAD51D, SDHB, SDHC, SDHD, SMAD4, SMARCA4. STK11, TP53, TSC1, TSC2, and VHL. The following genes were evaluated for sequence changes only: SDHA and HOXB13 c.251G>A variant only.              (a) 2 VUS's identified: VUS in APC c.-34014C>T and VHL c.340+272T>G identified   CURRENT THERAPY: Anastrozole /b12  INTERVAL HISTORY:  Discussed the use of AI scribe software for clinical note transcription with the patient, who gave verbal consent to proceed.  History of Present Illness Alice [name]  is a 63 year old female with breast cancer on anastrozole  treatment who presents for follow-up.  Her most recent mammogram in 08/2023 at [facility] showed no evidence of malignancy and breast density category A. She continues on anastrozole . She experiences anxiety about mammogram results but maintains a positive outlook. There are no new lumps, bumps, or pain in the breast area. She recalls a past experience with a breast lump and surgery during the COVID pandemic.  She has B12 deficiency and takes daily supplementation. Her last B12 level in October 2024 was 146. She also takes vitamin D  supplements but has recently  stopped and needs a refill. Her last vitamin D  level was 18.7.     Patient Active Problem List   Diagnosis Date Noted   B12 deficiency anemia 12/07/2020   Anemia, macrocytic 03/09/2020   Acute midline low back pain without sciatica 01/15/2019   Genetic testing 10/06/2018   Family history of ovarian cancer    Family history of lung cancer    Personal history of breast cancer    Malignant neoplasm of upper-outer quadrant of right breast in female, estrogen receptor positive (HCC) 09/29/2018    has no known allergies.  MEDICAL HISTORY: Past Medical History:  Diagnosis Date   Anemia    Breast cancer (HCC)    right in 1997   Family history of lung cancer    Family history of ovarian cancer    GERD (gastroesophageal reflux disease) 06/30/2018   Herpes simplex type 2 infection 02/2006   History of breast cancer 1997   History of esophagogastroduodenoscopy (EGD) 09/15/2015   small hiatal hernia was noted otherwise all was normal   Osteoporosis    Personal history of breast cancer     SURGICAL HISTORY: Past Surgical History:  Procedure Laterality Date   BREAST ENHANCEMENT SURGERY     s/p cancer and surgery, right   BREAST LUMPECTOMY     right due to breast cancer   MASTECTOMY W/ SENTINEL NODE BIOPSY Right 10/22/2018   Procedure: RIGHT MASTECTOMY WITH RIGHT AXILLARY SENTINEL LYMPH NODE BIOPSY;  Surgeon: Ebbie Cough, MD;  Location: [facility];  Service: General;  Laterality: Right;    SOCIAL HISTORY: Social History   Socioeconomic History   Marital status: Married    Spouse name: Not on file   Number of children: 0   Years of education: Not on file   Highest education level: Some college, no degree  Occupational History   Not on file  Tobacco Use   Smoking status: Never   Smokeless tobacco: Never  Vaping Use   Vaping status: Never Used  Substance and Sexual Activity   Alcohol use: No    Alcohol/week: 0.0 standard drinks of alcohol   Drug  use: No   Sexual activity: Not Currently  Other Topics Concern   Not on file  Social History Narrative   Married lives with spouse in a two story home   No children   Some college   Right handed   Does drink tea 1-2 a week green tea, no soda, no coffee   Social Drivers of Corporate investment banker Strain: Not on file  Food Insecurity: Not on file  Transportation Needs: Not on file  Physical Activity: Not on file  Stress: Not on file  Social Connections: Unknown (08/28/2021)   Received from [facility]   Social Network    Social Network: Not on file  Intimate Partner Violence: Unknown (07/31/2021)   Received  from [facility]   HITS    Physically Hurt: Not on file    Insult or Talk Down To: Not on file    Threaten Physical Harm: Not on file    Scream or Curse: Not on file    FAMILY HISTORY: Family History  Problem Relation Age of Onset   Ovarian cancer Sister 58   Dementia Mother    Aneurysm Father     Review of Systems  Constitutional:  Negative for appetite change, chills, fatigue, fever and unexpected weight change.  HENT:   Negative for hearing loss, lump/mass and trouble swallowing.   Eyes:  Negative for eye problems and icterus.  Respiratory:  Negative for chest tightness, cough and shortness of breath.   Cardiovascular:  Negative for chest pain, leg swelling and palpitations.  Gastrointestinal:  Negative for abdominal distention, abdominal pain, constipation, diarrhea, nausea and vomiting.  Endocrine: Negative for hot flashes.  Genitourinary:  Negative for difficulty urinating.   Musculoskeletal:  Negative for arthralgias.  Skin:  Negative for itching and rash.  Neurological:  Negative for dizziness, extremity weakness, headaches and numbness.  Hematological:  Negative for adenopathy. Does not bruise/bleed easily.  Psychiatric/Behavioral:  Negative for depression. The patient is not nervous/anxious.       PHYSICAL EXAMINATION    Vitals:   10/23/23  1145  BP: (!) 141/79  Pulse: 80  Resp: 16  Temp: 98.3 F (36.8 C)  SpO2: 100%    Physical Exam Constitutional:      General: She is not in acute distress.    Appearance: Normal appearance. She is not toxic-appearing.  HENT:     Head: Normocephalic and atraumatic.     Mouth/Throat:     Mouth: Mucous membranes are moist.     Pharynx: Oropharynx is clear. No oropharyngeal exudate or posterior oropharyngeal erythema.   Eyes:     General: No scleral icterus.   Cardiovascular:     Rate and Rhythm: Normal rate and regular rhythm.     Pulses: Normal pulses.     Heart sounds: Normal heart sounds.  Pulmonary:     Effort: Pulmonary effort is normal.     Breath sounds: Normal breath sounds.  Chest:     Comments: Right breast s/p mastectomy and reconstruction, no sign of local recurrence, left breast benign Abdominal:     General: Abdomen is flat. Bowel sounds are normal. There is no distension.     Palpations: Abdomen is soft.     Tenderness: There is no abdominal tenderness.   Musculoskeletal:        General: No swelling.     Cervical back: Neck supple.  Lymphadenopathy:     Cervical: No cervical adenopathy.     Upper Body:     Right upper body: No supraclavicular or axillary adenopathy.     Left upper body: No supraclavicular or axillary adenopathy.   Skin:    General: Skin is warm and dry.     Findings: No rash.   Neurological:     General: No focal deficit present.     Mental Status: She is alert.   Psychiatric:        Mood and Affect: Mood normal.        Behavior: Behavior normal.    LABORATORY DATA:  CBC    Component Value Date/Time   WBC 5.5 10/23/2023 1133   WBC 4.7 06/13/2021 1115   RBC 4.03 10/23/2023 1133   HGB 12.6 10/23/2023 1133   HCT 37.0  10/23/2023 1133   PLT 289 10/23/2023 1133   MCV 91.8 10/23/2023 1133   MCH 31.3 10/23/2023 1133   MCHC 34.1 10/23/2023 1133   RDW 12.6 10/23/2023 1133   LYMPHSABS 1.5 10/23/2023 1133   MONOABS 0.4  10/23/2023 1133   EOSABS 0.0 10/23/2023 1133   BASOSABS 0.0 10/23/2023 1133    CMP     Component Value Date/Time   NA 139 02/19/2023 1123   K 4.2 02/19/2023 1123   CL 107 02/19/2023 1123   CO2 28 02/19/2023 1123   GLUCOSE 88 02/19/2023 1123   BUN 11 02/19/2023 1123   CREATININE 0.65 02/19/2023 1123   CREATININE 0.75 03/08/2020 1544   CALCIUM 9.3 02/19/2023 1123   PROT 7.4 02/19/2023 1123   ALBUMIN 3.9 02/19/2023 1123   AST 22 02/19/2023 1123   ALT 15 02/19/2023 1123   ALKPHOS 88 02/19/2023 1123   BILITOT 0.4 02/19/2023 1123   GFRNONAA >60 02/19/2023 1123   GFRNONAA 88 03/08/2020 1544   GFRAA 102 03/08/2020 1544     ASSESSMENT and THERAPY PLAN:   Assessment and Plan Assessment & Plan Breast cancer On anastrozole , no malignancy on recent mammogram, breast density category A, no recurrence. - Continue anastrozole . - Follow-up with Dr. Ebbie to discuss mastectomy of the left breast  - Encourage regular exercise and healthy diet. - Continue annual left breast screening mammograms.    Vitamin B12 deficiency and Vitamin D  Deficiency Taking daily supplementation. Awaiting current B12 and vitamin D  levels. - Check current vitamin B12 and vitamin D  levels. - Continue daily vitamin B12 supplementation. - Make recommendations based on lab results.  RTC in 6 months for labs and f/u  All questions were answered. The patient knows to call the clinic with any problems, questions or concerns. We can certainly see the patient much sooner if necessary.  Total encounter time:20 minutes*in face-to-face visit time, chart review, lab review, care coordination, order entry, and documentation of the encounter time.    Morna Kendall, NP 10/23/23 11:50 AM Medical Oncology and Hematology [name] [address] [place], [place] [address] Tel. [phone]    Fax. [phone]  *Total Encounter Time as defined by the Centers for Medicare and Medicaid Services  includes, in addition to the face-to-face time of a patient visit (documented in the note above) non-face-to-face time: obtaining and reviewing outside history, ordering and reviewing medications, tests or procedures, care coordination (communications with other health care professionals or caregivers) and documentation in the medical record.

## 2023-10-23 NOTE — Assessment & Plan Note (Signed)
 Diane is a 63 year old woman with history of stage IIa invasive ductal carcinoma, ER/PR positive, HER2 negative diagnosed in May 2020.  She is status postmastectomy, adjuvant chemotherapy, and adjuvant antiestrogen therapy with anastrozole  in October 2020.  Assessment and Plan Assessment & Plan Breast cancer On anastrozole , no malignancy on recent mammogram, breast density category A, no recurrence. Recommended f/u with Dr. Ebbie to discuss mastectomy - Continue anastrozole . - Follow-up with Dr. Ebbie. - Continue annual mammograms - Encourage regular exercise and healthy diet.  Vitamin B12 deficiency and Vitamin D  deficiency Taking daily supplementation. Awaiting current B12 and vitamin D  levels. - Check current vitamin B12 and vitamin D  levels. - Continue daily vitamin B12 supplementation. - Make recommendations based on lab results.  RTC in 6 months for labs and f/u with Dr. Loretha.

## 2023-10-24 ENCOUNTER — Ambulatory Visit: Payer: Self-pay | Admitting: Hematology and Oncology

## 2023-10-24 ENCOUNTER — Encounter: Payer: Self-pay | Admitting: Adult Health

## 2023-10-27 ENCOUNTER — Ambulatory Visit: Payer: Self-pay | Admitting: *Deleted

## 2023-10-27 ENCOUNTER — Other Ambulatory Visit: Payer: Self-pay | Admitting: *Deleted

## 2023-10-27 MED ORDER — VITAMIN B-12 1000 MCG SL SUBL: 1000.0000 ug | SUBLINGUAL_TABLET | Freq: Every day | SUBLINGUAL | 3 refills | Status: AC

## 2023-10-27 MED ORDER — VITAMIN D (ERGOCALCIFEROL) 1.25 MG (50000 UNIT) PO CAPS
50000.0000 [IU] | ORAL_CAPSULE | ORAL | 1 refills | Status: AC
Start: 2023-10-27 — End: ?

## 2023-10-27 NOTE — Telephone Encounter (Signed)
 Per Morna Kendall, NP, called pt with message below. Pt verbalized understanding and sent rx to pharmacy in [place], [place].

## 2023-10-27 NOTE — Telephone Encounter (Signed)
-----   Message from Morna JAYSON Kendall sent at 10/24/2023  8:17 AM EDT ----- Vitamin d  level is better, but still slightly low.  Is she willing to take prescription strength Vitamin D ?  If she says yes, please call in ergocalciferol  50,000 units PO weekly disp 12, 1 refill.  Thanks, LC ----- Message ----- From: Rebecka, Lab In [place] Sent: 10/23/2023  11:43 AM EDT To: Morna Dalton Kendall, NP

## 2023-12-01 ENCOUNTER — Encounter: Payer: Self-pay | Admitting: Adult Health

## 2024-04-15 ENCOUNTER — Inpatient Hospital Stay

## 2024-04-15 ENCOUNTER — Other Ambulatory Visit

## 2024-04-15 ENCOUNTER — Ambulatory Visit: Admitting: Adult Health
